# Patient Record
Sex: Male | Born: 1945 | Race: White | Hispanic: No | Marital: Married | Smoking: Never smoker
Health system: Southern US, Community
[De-identification: ages and names within clinical notes are randomized; demographics above are authoritative.]

## PROBLEM LIST (undated history)

## (undated) DIAGNOSIS — G4730 Sleep apnea, unspecified: Secondary | ICD-10-CM

## (undated) DIAGNOSIS — M1990 Unspecified osteoarthritis, unspecified site: Secondary | ICD-10-CM

## (undated) DIAGNOSIS — R140 Abdominal distension (gaseous): Secondary | ICD-10-CM

## (undated) DIAGNOSIS — N189 Chronic kidney disease, unspecified: Secondary | ICD-10-CM

## (undated) DIAGNOSIS — K219 Gastro-esophageal reflux disease without esophagitis: Secondary | ICD-10-CM

## (undated) DIAGNOSIS — C801 Malignant (primary) neoplasm, unspecified: Secondary | ICD-10-CM

## (undated) DIAGNOSIS — R21 Rash and other nonspecific skin eruption: Secondary | ICD-10-CM

## (undated) DIAGNOSIS — M5431 Sciatica, right side: Secondary | ICD-10-CM

## (undated) DIAGNOSIS — J189 Pneumonia, unspecified organism: Secondary | ICD-10-CM

## (undated) DIAGNOSIS — I10 Essential (primary) hypertension: Secondary | ICD-10-CM

## (undated) DIAGNOSIS — D649 Anemia, unspecified: Secondary | ICD-10-CM

## (undated) DIAGNOSIS — M5126 Other intervertebral disc displacement, lumbar region: Secondary | ICD-10-CM

## (undated) HISTORY — DX: Gastro-esophageal reflux disease without esophagitis: K21.9

## (undated) HISTORY — DX: Essential (primary) hypertension: I10

## (undated) HISTORY — PX: TONSILLECTOMY: SUR1361

## (undated) HISTORY — DX: Abdominal distension (gaseous): R14.0

## (undated) HISTORY — DX: Malignant (primary) neoplasm, unspecified: C80.1

## (undated) HISTORY — PX: PROSTATECTOMY: SHX69

## (undated) HISTORY — DX: Rash and other nonspecific skin eruption: R21

## (undated) HISTORY — DX: Other intervertebral disc displacement, lumbar region: M51.26

---

## 1999-08-20 ENCOUNTER — Emergency Department (HOSPITAL_COMMUNITY): Admission: EM | Admit: 1999-08-20 | Discharge: 1999-08-20 | Payer: Self-pay | Admitting: Emergency Medicine

## 2001-02-20 ENCOUNTER — Encounter (INDEPENDENT_AMBULATORY_CARE_PROVIDER_SITE_OTHER): Payer: Self-pay | Admitting: Specialist

## 2001-02-20 ENCOUNTER — Ambulatory Visit (HOSPITAL_COMMUNITY): Admission: RE | Admit: 2001-02-20 | Discharge: 2001-02-20 | Payer: Self-pay | Admitting: Gastroenterology

## 2009-03-03 ENCOUNTER — Encounter (INDEPENDENT_AMBULATORY_CARE_PROVIDER_SITE_OTHER): Payer: Self-pay | Admitting: Gastroenterology

## 2009-03-03 ENCOUNTER — Ambulatory Visit (HOSPITAL_COMMUNITY): Admission: RE | Admit: 2009-03-03 | Discharge: 2009-03-03 | Payer: Self-pay | Admitting: Gastroenterology

## 2009-09-14 ENCOUNTER — Ambulatory Visit: Admission: RE | Admit: 2009-09-14 | Discharge: 2009-10-01 | Payer: Self-pay | Admitting: Radiation Oncology

## 2009-10-20 ENCOUNTER — Inpatient Hospital Stay (HOSPITAL_COMMUNITY): Admission: RE | Admit: 2009-10-20 | Discharge: 2009-10-21 | Payer: Self-pay | Admitting: Urology

## 2009-10-20 ENCOUNTER — Encounter (INDEPENDENT_AMBULATORY_CARE_PROVIDER_SITE_OTHER): Payer: Self-pay | Admitting: Urology

## 2010-05-06 ENCOUNTER — Encounter: Admission: RE | Admit: 2010-05-06 | Discharge: 2010-05-06 | Payer: Self-pay | Admitting: Family Medicine

## 2010-08-31 ENCOUNTER — Ambulatory Visit (HOSPITAL_COMMUNITY)
Admission: RE | Admit: 2010-08-31 | Discharge: 2010-09-01 | Payer: Self-pay | Source: Home / Self Care | Attending: Surgery | Admitting: Surgery

## 2010-09-01 HISTORY — PX: HERNIA REPAIR: SHX51

## 2010-11-21 LAB — COMPREHENSIVE METABOLIC PANEL
ALT: 14 U/L (ref 0–53)
AST: 16 U/L (ref 0–37)
Albumin: 3.7 g/dL (ref 3.5–5.2)
BUN: 20 mg/dL (ref 6–23)
CO2: 27 mEq/L (ref 19–32)
Calcium: 9 mg/dL (ref 8.4–10.5)
Creatinine, Ser: 1.01 mg/dL (ref 0.4–1.5)
Potassium: 4.3 mEq/L (ref 3.5–5.1)
Sodium: 142 mEq/L (ref 135–145)
Total Bilirubin: 0.6 mg/dL (ref 0.3–1.2)
Total Protein: 6.6 g/dL (ref 6.0–8.3)

## 2010-11-21 LAB — CBC
Hemoglobin: 11.4 g/dL — ABNORMAL LOW (ref 13.0–17.0)
Platelets: 435 10*3/uL — ABNORMAL HIGH (ref 150–400)
RBC: 4.06 MIL/uL — ABNORMAL LOW (ref 4.22–5.81)
WBC: 6.3 10*3/uL (ref 4.0–10.5)

## 2010-11-21 LAB — SURGICAL PCR SCREEN: MRSA, PCR: NEGATIVE

## 2010-12-01 LAB — DIFFERENTIAL
Basophils Absolute: 0.1 10*3/uL (ref 0.0–0.1)
Basophils Relative: 1 % (ref 0–1)
Eosinophils Absolute: 0 10*3/uL (ref 0.0–0.7)
Eosinophils Relative: 0 % (ref 0–5)
Eosinophils Relative: 0 % (ref 0–5)
Lymphocytes Relative: 4 % — ABNORMAL LOW (ref 12–46)
Lymphocytes Relative: 5 % — ABNORMAL LOW (ref 12–46)
Lymphs Abs: 0.7 10*3/uL (ref 0.7–4.0)
Monocytes Absolute: 0.1 10*3/uL (ref 0.1–1.0)
Monocytes Relative: 0 % — ABNORMAL LOW (ref 3–12)
Neutrophils Relative %: 90 % — ABNORMAL HIGH (ref 43–77)

## 2010-12-01 LAB — CBC
HCT: 36.7 % — ABNORMAL LOW (ref 39.0–52.0)
HCT: 40.3 % (ref 39.0–52.0)
Hemoglobin: 12.5 g/dL — ABNORMAL LOW (ref 13.0–17.0)
Hemoglobin: 13.2 g/dL (ref 13.0–17.0)
MCHC: 33.9 g/dL (ref 30.0–36.0)
MCV: 89.2 fL (ref 78.0–100.0)
MCV: 89.7 fL (ref 78.0–100.0)
Platelets: 329 10*3/uL (ref 150–400)
RBC: 4.37 MIL/uL (ref 4.22–5.81)
RDW: 13.7 % (ref 11.5–15.5)
RDW: 14.2 % (ref 11.5–15.5)

## 2010-12-01 LAB — TYPE AND SCREEN
ABO/RH(D): A POS
Antibody Screen: NEGATIVE

## 2010-12-01 LAB — BASIC METABOLIC PANEL
BUN: 9 mg/dL (ref 6–23)
CO2: 28 mEq/L (ref 19–32)
Calcium: 8.3 mg/dL — ABNORMAL LOW (ref 8.4–10.5)
Calcium: 8.4 mg/dL (ref 8.4–10.5)
Chloride: 105 mEq/L (ref 96–112)
Chloride: 107 mEq/L (ref 96–112)
Creatinine, Ser: 1.08 mg/dL (ref 0.4–1.5)
Creatinine, Ser: 1.09 mg/dL (ref 0.4–1.5)
GFR calc non Af Amer: >60 mL/min (ref >60)
Glucose, Bld: 160 mg/dL — ABNORMAL HIGH (ref 70–99)
Sodium: 140 mEq/L (ref 135–145)

## 2010-12-01 LAB — COMPREHENSIVE METABOLIC PANEL
AST: 15 U/L (ref 0–37)
Alkaline Phosphatase: 61 U/L (ref 39–117)
BUN: 22 mg/dL (ref 6–23)
CO2: 27 mEq/L (ref 19–32)
Chloride: 108 mEq/L (ref 96–112)
GFR calc non Af Amer: >60 mL/min (ref >60)
Glucose, Bld: 104 mg/dL — ABNORMAL HIGH (ref 70–99)
Potassium: 4.1 mEq/L (ref 3.5–5.1)
Total Bilirubin: 0.8 mg/dL (ref 0.3–1.2)
Total Protein: 6.6 g/dL (ref 6.0–8.3)

## 2010-12-01 LAB — URINALYSIS, ROUTINE W REFLEX MICROSCOPIC
Bilirubin Urine: NEGATIVE
Glucose, UA: NEGATIVE mg/dL
Hgb urine dipstick: NEGATIVE
Nitrite: NEGATIVE

## 2010-12-01 LAB — PROTIME-INR: Prothrombin Time: 13.2 seconds (ref 11.6–15.2)

## 2011-01-24 NOTE — Op Note (Signed)
Troy [name], Troy [name]               ACCOUNT NO.:  [id number]   MEDICAL RECORD NO.:  [id number]          PATIENT TYPE:  AMB   LOCATION:  ENDO                         FACILITY:  [facility]   PHYSICIAN:  Bernette Redbird, M.D.   DATE OF BIRTH:  [birth year]   DATE OF PROCEDURE:  03/03/2009  DATE OF DISCHARGE:  03/03/2009                               OPERATIVE REPORT   PROCEDURE:  Upper endoscopy with Savary dilatation of the esophagus  under fluoroscopy, with biopsies.   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   FINDINGS:  Prominent, somewhat inflamed esophageal ring, about 5-cm  hiatal hernia, dilated to 18 mm.   PROCEDURE IN DETAIL:  The procedure was familiar to the patient and  provided written consent.  Sedation was fentanyl 100 mcg and Versed 12  mg IV without arrhythmias or desaturation.  The Pentax video endoscope  was passed under direct vision.  The vocal cords were not well seen, but  the esophagus was readily entered.  It was normal except for a somewhat  inflamed, minimally eroded ring at the squamocolumnar junction, at about  36 cm, below which was a 5-cm hiatal hernia with the diaphragmatic  hiatus at 41 cm.  The diaphragmatic hiatus was quite patulous,  especially as observed on retroflexed viewing.   The ring offered no resistance to passage of the endoscope and in fact  was fairly widely patent.   The stomach had 2 small sessile polyps on the greater curve, right next  to each other, each measuring a few millimeters across, and each cold  biopsied to what appeared to be complete excision.  Nearby, was a small  mucosal bump measuring perhaps a centimeter across and a few millimeters  in height, but completely normal overlying mucosa.  So, I did not  biopsy.  The remainder of the stomach was normal.  As noted,  retroflexion of the proximal stomach showed the  hiatal hernia from its  inferior perspective with a rather patulous diaphragmatic hiatus.  The  pylorus, duodenal bulb, and second duodenum looked normal.   Savary dilatation was then performed in the standard fashion.  The  spring-tip guidewire was passed into the antrum of the stomach through  the scope, and the scope was removed in an exchange fashion, leaving the  guidewire in place.  After fluoroscopic confirmation of appropriate  positioning of the wire, a single passage was made with an 18-mm Savary  dilator, encountering no discrete resistance.  The widest portion of the  dilator was confirmed fluoroscopically to pass below the level of the  diaphragm.  The patient was then re-endoscoped under direct vision,  which showed fresh hemorrhage at the site of the ring without evidence  of active bleeding or brisk bleeding, nor any undue trauma to the  stomach, esophagus, or hypopharynx.  I did not see a discrete fracture  of the ring as such, and there was no evidence of perforation.  The  scope was then removed from the  patient.  He tolerated the procedure  well and there were no apparent complications.   IMPRESSION:  1. Somewhat excoriated esophageal ring dilated to 18 mm as described      above.  2. Moderate-sized hiatal hernia.  3. Small gastric polyps, biopsied.   PLAN:  1. I did recommend that the patient start daily PPI therapy since he      does have occasional heartburn symptoms and since there was some      minimal esophagitis right at the level of the ring.  I recommended      over-the-counter omeprazole 20 mg daily for at least the next year.  2. Await pathology results on the gastric polyps.  3. Clinical followup of dysphagia symptoms with repeat dilatation on a      p.r.n. basis.           ______________________________  Bernette Redbird, M.D.     RB/MEDQ  D:  03/03/2009  T:  03/04/2009  Job:  [id number]

## 2011-01-25 ENCOUNTER — Other Ambulatory Visit: Payer: Self-pay | Admitting: Urology

## 2011-01-25 ENCOUNTER — Encounter (HOSPITAL_COMMUNITY): Payer: BC Managed Care – PPO

## 2011-01-25 LAB — BASIC METABOLIC PANEL
CO2: 27 mEq/L (ref 19–32)
Calcium: 8.5 mg/dL (ref 8.4–10.5)
Creatinine, Ser: 1.04 mg/dL (ref 0.4–1.5)
GFR calc non Af Amer: >60 mL/min (ref >60)
Glucose, Bld: 98 mg/dL (ref 70–99)
Potassium: 4.2 mEq/L (ref 3.5–5.1)

## 2011-01-25 LAB — PROTIME-INR
INR: 1.07 (ref 0.00–1.49)
Prothrombin Time: 14.1 seconds (ref 11.6–15.2)

## 2011-01-25 LAB — SURGICAL PCR SCREEN: MRSA, PCR: NEGATIVE

## 2011-01-25 LAB — CBC
MCHC: 31.5 g/dL (ref 30.0–36.0)
MCV: 83.3 fL (ref 78.0–100.0)
Platelets: 418 10*3/uL — ABNORMAL HIGH (ref 150–400)
WBC: 6.1 10*3/uL (ref 4.0–10.5)

## 2011-01-27 NOTE — Procedures (Signed)
[place]  Patient:    Troy [name], Troy [name]                      MRN: [id number] Proc. Date: 02/20/01 Adm. Date:  [id number] Attending:  Rich Brave CC:         Carolyne Fiscal, M.D.   Procedure Report  PROCEDURE:  Colonoscopy.  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  FINDINGS:  Diminutive sigmoid polyp.  Sigmoid and left-side diverticular disease.  DESCRIPTION OF PROCEDURE:  The nature, purpose, and risks of the procedure had been discussed with the patient, who provided written consent.  Sedation was fentanyl 75 mcg and Versed 7.5 mg IV for the endoscopy which preceded this colonoscopy, and no additional sedation had to be given.  Digital exam of the prostate was unremarkable.  The Olympus adult video colonoscope was readily advanced to the cecum and for a moderate distance into a normal-appearing terminal ileum.  There was some lymphoid hyperplasia with slight overlying erythema but no evidence of ulcerations or stenosis.  Pullback around the colon was performed.  The quality of the prep was excellent, so it is felt that all areas were well-seen.  There was a 2-3 mm sessile hyperplastic-appearing polyp in the sigmoid region at about 35 cm, removed by a single cold biopsy.  There was also quite a bit of sigmoid diverticulosis and a small amount of left-sided diverticulosis in the descending colon.  Retroflexion in the rectum was normal.  Pullout through the anal canal demonstrated moderate internal hemorrhoids.  The patient tolerated the procedure well, and there were no apparent complications.  IMPRESSION: 1. Diminutive sigmoid polyp, pathology pending. 2. Sigmoid diverticulosis. 3. Moderate internal hemorrhoids.  PLAN:  Await pathology on the polyp. DD:  02/20/01 TD:  02/20/01 Job: [id number]

## 2011-01-27 NOTE — Procedures (Signed)
[place]  Patient:    Troy [name], Troy [name]                      MRN: [id number] Proc. Date: 02/20/01 Adm. Date:  [id number] Attending:  Rich Brave CC:         Carolyne Fiscal, M.D.   Procedure Report  PROCEDURE:  Upper endoscopy.  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  FINDINGS:  Esophageal ring above moderately large hiatal hernia with patulous diaphragmatic hiatus.  DESCRIPTION OF PROCEDURE:  The nature, purpose, and risks of the procedure had been discussed with the patient, who provided written consent.  Sedation was fentanyl 75 mcg and Versed 7.5 mg IV without arrhythmias or desaturation.  The Olympus video endoscope was passed under direct vision.  The vocal cords were unremarkable in appearance.  The esophagus was readily entered and had normal mucosa.  Specifically, there was no evidence of reflux esophagitis or Barretts esophagus, nor any infection, varices, or neoplasia.  There was an esophageal ring at the squamocolumnar junction, and below this was a several-centimeter, moderately large hiatal hernia.  On entering the stomach, the diaphragmatic hiatus, on retroflex viewing, was noted to be somewhat patulous.  The gastric mucosa was free of gastritis, erosions, ulcers, polyps, or masses, and the pylorus, duodenal bulb, and second duodenum looked normal. The scope was removed from the patient, who tolerated the procedure well and without apparent complication.  No biopsies were obtained.  IMPRESSION: 1. No evidence of reflux esophagitis, Barretts esophagus, or other adverse    sequelae of gastroesophageal reflux disease. 2. Esophageal ring present.  The patient does admit to intermittent dysphagia    symptoms, which are presumably arising from this abnormality. 3. Moderately large hiatal hernia with patulous diaphragmatic hiatus.  PLAN: 1. For now, medical therapy.  The patient has been  using Nexium for the past    month but when he runs out of samples, he plans to switch to the ranitidine    prescription I provided for him to see if that will adequately control his    symptoms. 2. Given the relatively large hiatal hernia and the patulous character of his    diaphragmatic hiatus, the patient would probably be a good candidate for    consideration of surgical antireflux management (that is, an antireflux    operation), particularly if the medications do not control his symptoms to    his satisfaction. 3. The option of esophageal dilatation in the future was offered to the    patient in the even that his dysphagia symptoms become bothersome in the    future. DD:  02/20/01 TD:  02/20/01 Job: [id number]

## 2011-01-31 ENCOUNTER — Observation Stay (HOSPITAL_COMMUNITY)
Admission: RE | Admit: 2011-01-31 | Discharge: 2011-02-01 | Disposition: A | Discharge status: Home / Self Care | Payer: BC Managed Care – PPO | Source: Ambulatory Visit | Attending: Urology | Admitting: Urology

## 2011-01-31 DIAGNOSIS — Z79899 Other long term (current) drug therapy: Secondary | ICD-10-CM | POA: Insufficient documentation

## 2011-01-31 DIAGNOSIS — K219 Gastro-esophageal reflux disease without esophagitis: Secondary | ICD-10-CM | POA: Insufficient documentation

## 2011-01-31 DIAGNOSIS — N393 Stress incontinence (female) (male): Principal | ICD-10-CM | POA: Insufficient documentation

## 2011-01-31 DIAGNOSIS — I10 Essential (primary) hypertension: Secondary | ICD-10-CM | POA: Insufficient documentation

## 2011-01-31 HISTORY — PX: INCONTINENCE SURGERY: SHX676

## 2011-01-31 LAB — BASIC METABOLIC PANEL
CO2: 28 mEq/L (ref 19–32)
Chloride: 104 mEq/L (ref 96–112)
Creatinine, Ser: 0.87 mg/dL (ref 0.4–1.5)
GFR calc Af Amer: >60 mL/min (ref >60)

## 2011-02-01 LAB — BASIC METABOLIC PANEL
BUN: 14 mg/dL (ref 6–23)
Chloride: 104 mEq/L (ref 96–112)
GFR calc Af Amer: >60 mL/min (ref >60)
Potassium: 3.8 mEq/L (ref 3.5–5.1)

## 2011-02-01 LAB — HEMOGLOBIN AND HEMATOCRIT, BLOOD: HCT: 31.1 % — ABNORMAL LOW (ref 39.0–52.0)

## 2011-02-06 NOTE — Op Note (Signed)
Troy [name], Troy [name]               ACCOUNT NO.:  [id number]  MEDICAL RECORD NO.:  [id number]           PATIENT TYPE:  O  LOCATION:  1434                         FACILITY:  [facility]  PHYSICIAN:  Martina Sinner, MD DATE OF BIRTH:  1946-02-24  DATE OF PROCEDURE:  01/31/2011 DATE OF DISCHARGE:                              OPERATIVE REPORT   ASSISTANT:  Delia Chimes, NP  PREOPERATIVE DIAGNOSIS:  Stress urinary incontinence.  POSTOPERATIVE DIAGNOSIS:  Stress urinary incontinence.  SURGERY:  Male sling (advance) plus cystoscopy.  Troy [name] reportedly had 1-2 pads a day stress incontinence and sent to the above procedure.  Preoperative antibiotics were given.  Lab work was normal.  Extra care was taken with leg positioning to minimize the risk of compartment syndrome, neuropathy, DVT.  He had a quite a long perineum.  I made a 5-6 cm perineal incision after draping the rectal area with my usual technique.  I sharply  dissected down to the skin and subcutaneous tissue mobilized the bulbospongiosus muscle.  The muscle was easily split in the midline and I mobilized it at the bifurcation of the corporal bodies and to identify a thick perineal tendon.  There is no question of his inappropriate area and I placed a 2-0 Vicryl suture at this stage to mark it.  I then removed the 14-French catheter.  I cystoscoped the patient.  With the usual mobility technique, there was no rotational ascent with the winking of the sphincter at the appropriate level.  Following this, the catheter was re-placed.  I marked the upper edge of the obturator foramen below adductor tendon bilaterally.  Despite being a large person, I could feel the correct area away from the tendon.  I marked with a marking pen and then the foramen needle in the usual technique.  I made a small stab incision.  I visually placed my finger in the upper edge of the triangle lateral to the urethra and passed the advanced  trocar.  He had a double pop maneuver and with appropriate orientation brought the needle out safely at the upper edge of the triangle away from urethra and other soft tissue.  I attached the sling in the appropriate orientation and brought it off and rotated up through the obturator foramen with a small hemostat needle.  I did the exact same procedure on the right.  I sent to the sling down to the area of the perineal tendon after bringing the suture through its middle aspect and loosely tied.  Holding the sling in place with my finger with my usual LEED technique, I gently pulled on both straps and there was nice rotational ascent of the bulbar urethra.  I re-cystoscoped the patient.  There was nice winking of the membranous urethra.  Urethra was relatively straighter as appropriate.  There is no injury.  Irrigation of all incisions was utilized.  I did a 3-layer closure with 3-0 Vicryl anatomically followed by 4-0 subcuticular.  In the inguinal area, I did my usual technique, making 2 stab incisions, posterior to the initial incision and brought the sling through that area.  I irrigated  sheaths and removed the sheaths.  These 4 incisions were closed with interrupted 4-0 Vicryl and Dermabond.  Fluff dressing was applied.  Leg position was good in the case.  Blood loss was less than 30 mL.  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  He was behaving more like he had significant urethral insufficiency, but he reports only 1 or 2 pads a day.  Perhaps the anesthesia tip the balance causing the more severe incontinence.  I am not convinced he was actually having overflow incontinence at the beginning of the surgery while he asleep.          ______________________________ Martina Sinner, MD     SAM/MEDQ  D:  01/31/2011  T:   02/01/2011  Job:  [id number]  Electronically Signed by Alfredo Martinez MD on 02/06/2011 06:15:04 PM

## 2011-06-02 ENCOUNTER — Ambulatory Visit (INDEPENDENT_AMBULATORY_CARE_PROVIDER_SITE_OTHER): Payer: BC Managed Care – PPO | Admitting: Surgery

## 2011-06-02 ENCOUNTER — Encounter (INDEPENDENT_AMBULATORY_CARE_PROVIDER_SITE_OTHER): Payer: Self-pay | Admitting: Surgery

## 2011-06-02 VITALS — BP 128/84 | HR 72 | Temp 97.5°F | Resp 16 | Ht 69.5 in | Wt 220.4 lb

## 2011-06-02 DIAGNOSIS — K439 Ventral hernia without obstruction or gangrene: Secondary | ICD-10-CM

## 2011-06-02 NOTE — Progress Notes (Signed)
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  The Physiomesh that I installed 08/2010 has apparently pulled away and her has a recurrence.  I will get a CT scan o f the abdomen prior to repeat surgery.  I would place a scope in him and plan to do an anterior approach and close his fascia primarily.  He is concerned about the contour of his abdomen.  There may be some diastasis of the midline as well.   He wants to do this when he is off in November or December.   Will get a CT abdomen/pelvis and schedule for later this year.

## 2011-08-11 ENCOUNTER — Telehealth (INDEPENDENT_AMBULATORY_CARE_PROVIDER_SITE_OTHER): Payer: Self-pay | Admitting: General Surgery

## 2011-08-11 ENCOUNTER — Other Ambulatory Visit (INDEPENDENT_AMBULATORY_CARE_PROVIDER_SITE_OTHER): Payer: Self-pay | Admitting: General Surgery

## 2011-08-11 DIAGNOSIS — K439 Ventral hernia without obstruction or gangrene: Secondary | ICD-10-CM

## 2011-08-14 ENCOUNTER — Other Ambulatory Visit (INDEPENDENT_AMBULATORY_CARE_PROVIDER_SITE_OTHER): Payer: Self-pay | Admitting: General Surgery

## 2011-08-14 DIAGNOSIS — K469 Unspecified abdominal hernia without obstruction or gangrene: Secondary | ICD-10-CM

## 2011-08-16 ENCOUNTER — Ambulatory Visit
Admission: RE | Admit: 2011-08-16 | Discharge: 2011-08-16 | Disposition: A | Discharge status: Home / Self Care | Payer: Medicare Other | Source: Ambulatory Visit | Attending: Surgery | Admitting: Surgery

## 2011-08-16 DIAGNOSIS — K439 Ventral hernia without obstruction or gangrene: Secondary | ICD-10-CM

## 2011-08-23 ENCOUNTER — Encounter (INDEPENDENT_AMBULATORY_CARE_PROVIDER_SITE_OTHER): Payer: Self-pay | Admitting: Surgery

## 2011-09-01 ENCOUNTER — Encounter (HOSPITAL_COMMUNITY)
Admission: RE | Admit: 2011-09-01 | Discharge: 2011-09-01 | Disposition: A | Discharge status: Home / Self Care | Payer: Medicare Other | Source: Ambulatory Visit | Attending: Surgery | Admitting: Surgery

## 2011-09-01 ENCOUNTER — Encounter (HOSPITAL_COMMUNITY): Payer: Self-pay

## 2011-09-01 DIAGNOSIS — M5431 Sciatica, right side: Secondary | ICD-10-CM

## 2011-09-01 HISTORY — DX: Sciatica, right side: M54.31

## 2011-09-01 LAB — BASIC METABOLIC PANEL
CO2: 27 mEq/L (ref 19–32)
Chloride: 105 mEq/L (ref 96–112)
Glucose, Bld: 101 mg/dL — ABNORMAL HIGH (ref 70–99)
Potassium: 3.7 mEq/L (ref 3.5–5.1)
Sodium: 140 mEq/L (ref 135–145)

## 2011-09-01 LAB — SURGICAL PCR SCREEN
MRSA, PCR: NEGATIVE
Staphylococcus aureus: NEGATIVE

## 2011-09-01 LAB — CBC
Hemoglobin: 11.4 g/dL — ABNORMAL LOW (ref 13.0–17.0)
RBC: 4.23 MIL/uL (ref 4.22–5.81)

## 2011-09-01 NOTE — Patient Instructions (Signed)
20 Troy [name]  09/01/2011   Your procedure is scheduled on: 09-06-11  Report to [facility] at 0900 AM.  Call this number if you have problems the morning of surgery: [phone]   Remember:   Do not eat food:After Midnight.  May have clear liquids:until Midnight .  Clear liquids include soda, tea, black coffee, apple or grape juice, broth.  Take these medicines the morning of surgery with A SIP OF WATER: Amlodipine, Atenolol, Ranitidine, Tylenol   Do not wear jewelry, make-up or nail polish.  Do not wear lotions, powders, or perfumes. You may wear deodorant.  Do not shave 48 hours prior to surgery.  Do not bring valuables to the hospital.  Contacts, dentures or bridgework may not be worn into surgery.  Leave suitcase in the car. After surgery it may be brought to your room.  For patients admitted to the hospital, checkout time is 11:00 AM the day of discharge.   Patients discharged the day of surgery will not be allowed to drive home.  Name and phone number of your driver: Dedric Ethington, spouse [phone]  Special Instructions: CHG Shower Use Special Wash: 1/2 bottle night before surgery and 1/2 bottle morning of surgery.   Please read over the following fact sheets that you were given: MRSA Information

## 2011-09-01 NOTE — Pre-Procedure Instructions (Addendum)
09-01-11 CT ABDomen/pelvis(08-16-11) report with chart with chest view. Pt. States( "does not want to have further x-rays at this time"). No CXR done today due to this. EKG 01-25-11 report with chart.

## 2011-09-01 NOTE — Pre-Procedure Instructions (Signed)
09-01-11 EKG( 01-25-11) report with chart.

## 2011-09-06 ENCOUNTER — Ambulatory Visit (HOSPITAL_COMMUNITY): Payer: Medicare Other | Admitting: Anesthesiology

## 2011-09-06 ENCOUNTER — Encounter (HOSPITAL_COMMUNITY): Payer: Self-pay | Admitting: Anesthesiology

## 2011-09-06 ENCOUNTER — Observation Stay (HOSPITAL_COMMUNITY)
Admission: AD | Admit: 2011-09-06 | Discharge: 2011-09-07 | DRG: 355 | Disposition: A | Discharge status: Home / Self Care | Payer: Medicare Other | Source: Ambulatory Visit | Attending: Surgery | Admitting: Surgery

## 2011-09-06 ENCOUNTER — Encounter (HOSPITAL_COMMUNITY)
Admission: AD | Disposition: A | Discharge status: Home / Self Care | Payer: Self-pay | Source: Ambulatory Visit | Attending: Surgery

## 2011-09-06 ENCOUNTER — Encounter (HOSPITAL_COMMUNITY): Payer: Self-pay | Admitting: *Deleted

## 2011-09-06 DIAGNOSIS — K439 Ventral hernia without obstruction or gangrene: Secondary | ICD-10-CM

## 2011-09-06 DIAGNOSIS — Z8546 Personal history of malignant neoplasm of prostate: Secondary | ICD-10-CM

## 2011-09-06 DIAGNOSIS — K219 Gastro-esophageal reflux disease without esophagitis: Secondary | ICD-10-CM

## 2011-09-06 DIAGNOSIS — K432 Incisional hernia without obstruction or gangrene: Principal | ICD-10-CM | POA: Diagnosis present

## 2011-09-06 DIAGNOSIS — I10 Essential (primary) hypertension: Secondary | ICD-10-CM | POA: Diagnosis present

## 2011-09-06 DIAGNOSIS — Z01812 Encounter for preprocedural laboratory examination: Secondary | ICD-10-CM | POA: Insufficient documentation

## 2011-09-06 DIAGNOSIS — Z79899 Other long term (current) drug therapy: Secondary | ICD-10-CM | POA: Insufficient documentation

## 2011-09-06 DIAGNOSIS — Z9079 Acquired absence of other genital organ(s): Secondary | ICD-10-CM

## 2011-09-06 HISTORY — PX: VENTRAL HERNIA REPAIR: SHX424

## 2011-09-06 LAB — CREATININE, SERUM
Creatinine, Ser: 0.97 mg/dL (ref 0.50–1.35)
GFR calc Af Amer: >90 mL/min (ref >90)
GFR calc non Af Amer: 85 mL/min — ABNORMAL LOW (ref >90)

## 2011-09-06 LAB — CBC
Hemoglobin: 10.9 g/dL — ABNORMAL LOW (ref 13.0–17.0)
MCH: 27.4 pg (ref 26.0–34.0)
Platelets: 342 10*3/uL (ref 150–400)
RBC: 3.98 MIL/uL — ABNORMAL LOW (ref 4.22–5.81)
WBC: 11.4 10*3/uL — ABNORMAL HIGH (ref 4.0–10.5)

## 2011-09-06 SURGERY — REPAIR, HERNIA, VENTRAL, LAPAROSCOPIC
Anesthesia: General | Site: Abdomen | Wound class: Clean

## 2011-09-06 MED ORDER — DEXAMETHASONE SODIUM PHOSPHATE 10 MG/ML IJ SOLN
INTRAMUSCULAR | Status: DC | PRN
  Administered 2011-09-06: 10 mg via INTRAVENOUS

## 2011-09-06 MED ORDER — DIPHENHYDRAMINE HCL 50 MG/ML IJ SOLN: 12.5000 mg | Freq: Four times a day (QID) | INTRAMUSCULAR | Status: DC | PRN

## 2011-09-06 MED ORDER — NEOSTIGMINE METHYLSULFATE 1 MG/ML IJ SOLN
INTRAMUSCULAR | Status: DC | PRN
  Administered 2011-09-06: 5 mg via INTRAVENOUS

## 2011-09-06 MED ORDER — PANTOPRAZOLE SODIUM 40 MG IV SOLR
40.0000 mg | Freq: Every day | INTRAVENOUS | Status: DC
  Administered 2011-09-06: 40 mg via INTRAVENOUS
  Filled 2011-09-06 (×2): qty 40

## 2011-09-06 MED ORDER — BUPIVACAINE LIPOSOME 1.3 % IJ SUSP
20.0000 mL | Freq: Once | INTRAMUSCULAR | Status: DC
  Filled 2011-09-06: qty 20

## 2011-09-06 MED ORDER — ACETAMINOPHEN 10 MG/ML IV SOLN
INTRAVENOUS | Status: AC
  Filled 2011-09-06: qty 100

## 2011-09-06 MED ORDER — KCL IN DEXTROSE-NACL 20-5-0.45 MEQ/L-%-% IV SOLN
INTRAVENOUS | Status: DC
  Administered 2011-09-06 – 2011-09-07 (×2): via INTRAVENOUS
  Filled 2011-09-06 (×3): qty 1000

## 2011-09-06 MED ORDER — HEPARIN SODIUM (PORCINE) 5000 UNIT/ML IJ SOLN
5000.0000 [IU] | Freq: Three times a day (TID) | INTRAMUSCULAR | Status: DC
  Administered 2011-09-06 – 2011-09-07 (×3): 5000 [IU] via SUBCUTANEOUS
  Filled 2011-09-06 (×5): qty 1

## 2011-09-06 MED ORDER — FENTANYL CITRATE 0.05 MG/ML IJ SOLN
INTRAMUSCULAR | Status: DC | PRN
  Administered 2011-09-06: 50 ug via INTRAVENOUS
  Administered 2011-09-06: 100 ug via INTRAVENOUS
  Administered 2011-09-06 (×2): 50 ug via INTRAVENOUS

## 2011-09-06 MED ORDER — ONDANSETRON HCL 4 MG/2ML IJ SOLN
INTRAMUSCULAR | Status: DC | PRN
  Administered 2011-09-06: 4 mg via INTRAVENOUS

## 2011-09-06 MED ORDER — PROPOFOL 10 MG/ML IV EMUL
INTRAVENOUS | Status: DC | PRN
  Administered 2011-09-06: 200 mg via INTRAVENOUS

## 2011-09-06 MED ORDER — PROMETHAZINE HCL 25 MG/ML IJ SOLN: 6.2500 mg | INTRAMUSCULAR | Status: DC | PRN

## 2011-09-06 MED ORDER — ROCURONIUM BROMIDE 100 MG/10ML IV SOLN
INTRAVENOUS | Status: DC | PRN
  Administered 2011-09-06: 40 mg via INTRAVENOUS
  Administered 2011-09-06 (×2): 10 mg via INTRAVENOUS

## 2011-09-06 MED ORDER — ONDANSETRON HCL 4 MG/2ML IJ SOLN: 4.0000 mg | Freq: Four times a day (QID) | INTRAMUSCULAR | Status: DC | PRN

## 2011-09-06 MED ORDER — NALOXONE HCL 0.4 MG/ML IJ SOLN: 0.4000 mg | INTRAMUSCULAR | Status: DC | PRN

## 2011-09-06 MED ORDER — GLYCOPYRROLATE 0.2 MG/ML IJ SOLN
INTRAMUSCULAR | Status: DC | PRN
  Administered 2011-09-06: .2 mg via INTRAVENOUS
  Administered 2011-09-06: .6 mg via INTRAVENOUS

## 2011-09-06 MED ORDER — SODIUM CHLORIDE 0.9 % IJ SOLN: 9.0000 mL | INTRAMUSCULAR | Status: DC | PRN

## 2011-09-06 MED ORDER — ACETAMINOPHEN 10 MG/ML IV SOLN
INTRAVENOUS | Status: DC | PRN
  Administered 2011-09-06: 1000 mg via INTRAVENOUS

## 2011-09-06 MED ORDER — 0.9 % SODIUM CHLORIDE (POUR BTL) OPTIME
TOPICAL | Status: DC | PRN
  Administered 2011-09-06: 1000 mL

## 2011-09-06 MED ORDER — BUPIVACAINE LIPOSOME 1.3 % IJ SUSP
INTRAMUSCULAR | Status: DC | PRN
  Administered 2011-09-06: 20 mL

## 2011-09-06 MED ORDER — LACTATED RINGERS IV SOLN
INTRAVENOUS | Status: DC
Start: 2011-09-06 — End: 2011-09-06
  Administered 2011-09-06: 13:00:00 via INTRAVENOUS
  Administered 2011-09-06: 1000 mL via INTRAVENOUS
  Administered 2011-09-06: 12:00:00 via INTRAVENOUS

## 2011-09-06 MED ORDER — CEFAZOLIN SODIUM-DEXTROSE 2-3 GM-% IV SOLR
2.0000 g | Freq: Once | INTRAVENOUS | Status: AC
  Administered 2011-09-06: 2 g via INTRAVENOUS

## 2011-09-06 MED ORDER — LACTATED RINGERS IV SOLN: INTRAVENOUS | Status: DC

## 2011-09-06 MED ORDER — DIPHENHYDRAMINE HCL 12.5 MG/5ML PO ELIX: 12.5000 mg | ORAL_SOLUTION | Freq: Four times a day (QID) | ORAL | Status: DC | PRN

## 2011-09-06 MED ORDER — EPHEDRINE SULFATE 50 MG/ML IJ SOLN
INTRAMUSCULAR | Status: DC | PRN
  Administered 2011-09-06 (×2): 10 mg via INTRAVENOUS

## 2011-09-06 MED ORDER — CEFAZOLIN SODIUM-DEXTROSE 2-3 GM-% IV SOLR
INTRAVENOUS | Status: AC
  Filled 2011-09-06: qty 50

## 2011-09-06 MED ORDER — MORPHINE SULFATE (PF) 1 MG/ML IV SOLN
INTRAVENOUS | Status: DC
  Administered 2011-09-06: 3 mg via INTRAVENOUS
  Administered 2011-09-06: 16:00:00 via INTRAVENOUS
  Administered 2011-09-07 (×3): 1.5 mg via INTRAVENOUS
  Filled 2011-09-06: qty 25

## 2011-09-06 MED ORDER — HYDROMORPHONE HCL PF 1 MG/ML IJ SOLN
INTRAMUSCULAR | Status: AC
  Filled 2011-09-06: qty 1

## 2011-09-06 MED ORDER — HEPARIN SODIUM (PORCINE) 5000 UNIT/ML IJ SOLN
5000.0000 [IU] | Freq: Once | INTRAMUSCULAR | Status: AC
  Administered 2011-09-06: 5000 [IU] via SUBCUTANEOUS

## 2011-09-06 MED ORDER — LIDOCAINE HCL (CARDIAC) 20 MG/ML IV SOLN
INTRAVENOUS | Status: DC | PRN
  Administered 2011-09-06: 50 mg via INTRAVENOUS

## 2011-09-06 MED ORDER — DIPHENHYDRAMINE HCL 12.5 MG/5ML PO ELIX
12.5000 mg | ORAL_SOLUTION | Freq: Four times a day (QID) | ORAL | Status: DC | PRN
  Filled 2011-09-06: qty 5

## 2011-09-06 MED ORDER — MEPERIDINE HCL 50 MG/ML IJ SOLN: 6.2500 mg | INTRAMUSCULAR | Status: DC | PRN

## 2011-09-06 MED ORDER — SUCCINYLCHOLINE CHLORIDE 20 MG/ML IJ SOLN
INTRAMUSCULAR | Status: DC | PRN
  Administered 2011-09-06: 100 mg via INTRAVENOUS

## 2011-09-06 MED ORDER — HYDROMORPHONE HCL PF 1 MG/ML IJ SOLN
0.2500 mg | INTRAMUSCULAR | Status: DC | PRN
  Administered 2011-09-06 (×2): 0.5 mg via INTRAVENOUS

## 2011-09-06 SURGICAL SUPPLY — 66 items
APL SKNCLS STERI-STRIP NONHPOA (GAUZE/BANDAGES/DRESSINGS) ×2
BENZOIN TINCTURE PRP APPL 2/3 (GAUZE/BANDAGES/DRESSINGS) ×3 IMPLANT
BINDER ABD UNIV 12 45-62 (WOUND CARE) IMPLANT
BINDER ABD UNIV 9 30-45 (GAUZE/BANDAGES/DRESSINGS) IMPLANT
BINDER ABDOMINAL 46IN 62IN (WOUND CARE)
BINDER ABDOMINAL 9 (GAUZE/BANDAGES/DRESSINGS) ×3
BLADE HEX COATED 2.75 (ELECTRODE) ×3 IMPLANT
CANISTER SUCTION 2500CC (MISCELLANEOUS) ×5 IMPLANT
CANNULA ENDOPATH XCEL 11M (ENDOMECHANICALS) IMPLANT
CLOTH BEACON ORANGE TIMEOUT ST (SAFETY) ×6 IMPLANT
COVER SURGICAL LIGHT HANDLE (MISCELLANEOUS) ×3 IMPLANT
DECANTER SPIKE VIAL GLASS SM (MISCELLANEOUS) ×2 IMPLANT
DEVICE SECURE STRAP 25 ABSORB (INSTRUMENTS) IMPLANT
DEVICE TROCAR PUNCTURE CLOSURE (ENDOMECHANICALS) ×2 IMPLANT
DISSECTOR BLUNT TIP ENDO 5MM (MISCELLANEOUS) IMPLANT
DRAIN CHANNEL 10F 3/8 F FF (DRAIN) IMPLANT
DRAIN CHANNEL RND F F (WOUND CARE) IMPLANT
DRAPE LAPAROSCOPIC ABDOMINAL (DRAPES) ×6 IMPLANT
ELECT REM PT RETURN 9FT ADLT (ELECTROSURGICAL) ×6
ELECTRODE REM PT RTRN 9FT ADLT (ELECTROSURGICAL) ×4 IMPLANT
EVACUATOR SILICONE 100CC (DRAIN) IMPLANT
GLOVE BIOGEL M 8.0 STRL (GLOVE) ×6 IMPLANT
GLOVE BIOGEL PI IND STRL 7.0 (GLOVE) ×4 IMPLANT
GLOVE BIOGEL PI INDICATOR 7.0 (GLOVE) ×2
GOWN STRL NON-REIN LRG LVL3 (GOWN DISPOSABLE) ×6 IMPLANT
GOWN STRL REIN XL XLG (GOWN DISPOSABLE) ×12 IMPLANT
HAND ACTIVATED (MISCELLANEOUS) ×1 IMPLANT
KIT BASIN OR (CUSTOM PROCEDURE TRAY) ×6 IMPLANT
MESH HERNIA 3X6 (Mesh General) ×1 IMPLANT
NDL SPNL 22GX3.5 QUINCKE BK (NEEDLE) ×2 IMPLANT
NEEDLE HYPO 22GX1.5 SAFETY (NEEDLE) IMPLANT
NEEDLE SPNL 22GX3.5 QUINCKE BK (NEEDLE) ×3 IMPLANT
NS IRRIG 1000ML POUR BTL (IV SOLUTION) ×6 IMPLANT
PACK GENERAL/GYN (CUSTOM PROCEDURE TRAY) ×3 IMPLANT
PEN SKIN MARKING BROAD (MISCELLANEOUS) ×3 IMPLANT
PENCIL BUTTON HOLSTER BLD 10FT (ELECTRODE) ×1 IMPLANT
SET IRRIG TUBING LAPAROSCOPIC (IRRIGATION / IRRIGATOR) IMPLANT
SLEEVE XCEL OPT CAN 5 100 (ENDOMECHANICALS) IMPLANT
SLEEVE Z-THREAD 5X100MM (TROCAR) ×3 IMPLANT
SOLUTION ANTI FOG 6CC (MISCELLANEOUS) ×3 IMPLANT
SPONGE GAUZE 4X4 12PLY (GAUZE/BANDAGES/DRESSINGS) ×3 IMPLANT
SPONGE LAP 18X18 X RAY DECT (DISPOSABLE) ×6 IMPLANT
STAPLER VISISTAT 35W (STAPLE) ×3 IMPLANT
STRIP CLOSURE SKIN 1/2X4 (GAUZE/BANDAGES/DRESSINGS) ×6 IMPLANT
SUT ETHILON 3 0 PS 1 (SUTURE) IMPLANT
SUT NOVA 0 T19/GS 22DT (SUTURE) IMPLANT
SUT NOVA 1 T20/GS 25DT (SUTURE) IMPLANT
SUT NOVA NAB DX-16 0-1 5-0 T12 (SUTURE) ×2 IMPLANT
SUT NOVA NAB GS-21 1 T12 (SUTURE) ×1 IMPLANT
SUT PDS AB 1 CTX 36 (SUTURE) IMPLANT
SUT PROLENE 0 CT 1 CR/8 (SUTURE) ×3 IMPLANT
SUT VIC AB 4-0 SH 18 (SUTURE) ×4 IMPLANT
SYR 30ML LL (SYRINGE) ×3 IMPLANT
SYR CONTROL 10ML LL (SYRINGE) IMPLANT
TACKER 5MM HERNIA 3.5CML NAB (ENDOMECHANICALS) ×1 IMPLANT
TAPE CLOTH SURG 4X10 WHT LF (GAUZE/BANDAGES/DRESSINGS) ×1 IMPLANT
TIPS TEFLON (MISCELLANEOUS) ×2 IMPLANT
TRAY FOLEY CATH 14FRSI W/METER (CATHETERS) ×4 IMPLANT
TRAY LAP CHOLE (CUSTOM PROCEDURE TRAY) ×3 IMPLANT
TROCAR BLADELESS OPT 5 100 (ENDOMECHANICALS) ×3 IMPLANT
TROCAR HASSON GELL 12X100 (TROCAR) IMPLANT
TROCAR Z-THREAD FIOS 11X100 BL (TROCAR) ×3 IMPLANT
TROCAR Z-THREAD FIOS 5X100MM (TROCAR) ×3 IMPLANT
TROCAR Z-THREAD SLEEVE 11X100 (TROCAR) IMPLANT
TUBING FILTER THERMOFLATOR (ELECTROSURGICAL) IMPLANT
TUBING INSUFFLATION 10FT LAP (TUBING) ×3 IMPLANT

## 2011-09-06 NOTE — Anesthesia Preprocedure Evaluation (Addendum)
Anesthesia Evaluation  Patient identified by MRN, date of birth, ID band Patient awake    Reviewed: Allergy & Precautions, H&P , NPO status , Patient's Chart, lab work & pertinent test results  Airway Mallampati: II TM Distance: >3 FB Neck ROM: Full    Dental No notable dental hx.    Pulmonary neg pulmonary ROS,  clear to auscultation  Pulmonary exam normal       Cardiovascular hypertension, Pt. on medications neg cardio ROS Regular Normal    Neuro/Psych Negative Neurological ROS  Negative Psych ROS   GI/Hepatic negative GI ROS, Neg liver ROS, GERD-  ,  Endo/Other  Negative Endocrine ROS  Renal/GU negative Renal ROS  Genitourinary negative   Musculoskeletal negative musculoskeletal ROS (+)   Abdominal   Peds negative pediatric ROS (+)  Hematology negative hematology ROS (+)   Anesthesia Other Findings   Reproductive/Obstetrics negative OB ROS                           Anesthesia Physical Anesthesia Plan  ASA: II  Anesthesia Plan: General   Post-op Pain Management:    Induction: Intravenous  Airway Management Planned: Oral ETT  Additional Equipment:   Intra-op Plan:   Post-operative Plan: Extubation in OR  Informed Consent: I have reviewed the patients History and Physical, chart, labs and discussed the procedure including the risks, benefits and alternatives for the proposed anesthesia with the patient or authorized representative who has indicated his/her understanding and acceptance.   Dental advisory given  Plan Discussed with: CRNA  Anesthesia Plan Comments:        Anesthesia Quick Evaluation

## 2011-09-06 NOTE — Anesthesia Postprocedure Evaluation (Signed)
  Anesthesia Post-op Note  Patient: Troy [name]  Procedure(s) Performed:  HERNIA REPAIR VENTRAL ADULT; LAPAROSCOPIC VENTRAL HERNIA  Patient Location: PACU  Anesthesia Type: General  Level of Consciousness: awake and alert   Airway and Oxygen Therapy: Patient Spontanous Breathing  Post-op Pain: mild  Post-op Assessment: Post-op Vital signs reviewed, Patient's Cardiovascular Status Stable, Respiratory Function Stable, Patent Airway and No signs of Nausea or vomiting  Post-op Vital Signs: stable  Complications: No apparent anesthesia complications

## 2011-09-06 NOTE — Op Note (Signed)
Surgeon: Wenda Low, MD, FACS  Asst:  Lodema Pilot, DO   Anes:  general  Procedure: Lap assisted recurrent ventral hernia repair with mesh  Diagnosis: Recurrent ventral incisional hernia  Complications: none  EBL:   30 cc cc  Description of Procedure:   The patient was taken to or 1 and given general anesthesia. Abdomen was prepped with PCMX and draped sterilely. A timeout was performed. Access to the abdomen was achieved by incising an old incision in the left upper quadrant and inserting the 5 mm Optiview trocar with a 0 scope. The ventral hernia recurrent site was above the umbilicus and the omentum was densely adherent to the old mesh (Physiomesh) this was taken down with sharp and blunt dissection. The upper portion of the mesh had pulled away and layup inside this recurrent hernia sac. At this point elected to make an elliptical incision and excised the previous incision and then resect the hernia sac. The hernia sac contained a free edge of the mesh separating these 2. I will mobilize the omentum and pulled up in the incision and found one bleeder that I ligated with 4-0 Vicryl. We inspected the rest of the omentum and no other bleeders were noted. The mesh was then tacked superiorly into the right side with horizontal mattress sutures of 0 Novafil. This placed mesh entirely behind the hernia. I then made relaxing incisions on the right and left side and closed the defect primarily with #1 Novafil interrupted and didn't before tying them I threaded the sutures up through Marlex mesh and then tied this down. A good approximation closure was present. I went in with the scope again in and visualized the hernia repair in and looked good. The inguinal hernias that had been noted on CT scan were not visualized because of the amount of properitoneal fat that was noted down near. Was unable to visualize his sliding hiatal hernia from the trocar sites we had to utilize for the ventral  hernia.  Exparel was injected in the 2 laparoscopic ports in the hernia repair. The hernia repair site was further approximated with 4-0 Vicryl's to obliterate the deadspace of the wound and then 4-0 Vicryl subcutaneously and with all skin was closed with staples. Patient tolerated procedure well was taken to the recovery room in satisfactory condition.  Matt B. Daphine Deutscher, MD, [facility], [place] [phone]

## 2011-09-06 NOTE — H&P (Signed)
Chief Complaint:  Recurrent ventral incisional hernia.   History of Present Illness:  Troy [name] is an 65 y.o. male who had a robotic prostatectomy about 2 years ago.  I performed a lap ventral hernia repair on this supraumbilical incisional hernia last December.  He had a bladder sling in May and by September he noted a recurrent ventral hernia.  A CT scan showed omentum in the hernia.  Informed consent was obtained preop.  His primary care is Troy [name]   Past Medical History  Diagnosis Date  . GERD (gastroesophageal reflux disease)   . Cancer     prostate   . Hypertension   . Abdominal distention     hernia  . Rash     yeast infection from bladder sling surgery   . Sciatica of right side 09-01-11    right leg-tx. Tylenol    Past Surgical History  Procedure Date  . Hernia repair September 01, 2010  . Incontinence surgery Jan 31, 2011  . Prostatectomy reb 9, 2010    Current Facility-Administered Medications  Medication Dose Route Frequency Provider Last Rate Last Dose  . bupivacaine liposome (EXPAREL) 1.3 % injection 266 mg  20 mL Infiltration Once Valarie Merino, MD      . ceFAZolin (ANCEF) IVPB 2 g/50 mL premix  2 g Intravenous Once Valarie Merino, MD      . heparin injection 5,000 Units  5,000 Units Subcutaneous Once Valarie Merino, MD   5,000 Units at 09/06/11 0919  . lactated ringers infusion   Intravenous Continuous Phillips Grout, MD 100 mL/hr at 09/06/11 0941 1,000 mL at 09/06/11 0941   Ciprofloxacin Family History  Problem Relation Age of Onset  . Cancer Mother 90    lung    Social History:   reports that he has never smoked. He has never used smokeless tobacco. He reports that he drinks alcohol. He reports that he does not use illicit drugs.   REVIEW OF SYSTEMS - PERTINENT POSITIVES ONLY: GERD with probable anemia, no MI.  S/p prostatectomy; inguinal hernias  Physical Exam:   Blood pressure 155/89, pulse 58, temperature 98 F (36.7 C), temperature  source Oral, resp. rate 18, SpO2 98.00%. There is no height or weight on file to calculate BMI.  Gen:  WDWN W male in  NAD  Neurological: Alert and oriented to person, place, and time. Motor and sensory function is grossly intact  Head: Normocephalic and atraumatic.  Eyes: Conjunctivae are normal. Pupils are equal, round, and reactive to light. No scleral icterus.  Neck: Normal range of motion. Neck supple. No tracheal deviation or thyromegaly present.  Cardiovascular:  SR without murmurs or gallops.  No carotid bruits Respiratory: Effort normal.  No respiratory distress. No chest wall tenderness. Breath sounds normal.  No wheezes, rales or rhonchi.  Abdomen:  Ventral hernia at site of robotic prostatectomy  GU: Musculoskeletal: Normal range of motion. Extremities are nontender. No cyanosis, edema or clubbing noted Lymphadenopathy: No cervical, preauricular, postauricular or axillary adenopathy is present Skin: Skin is warm and dry. No rash noted. No diaphoresis. No erythema. No pallor. Pscyh: Normal mood and affect. Behavior is normal. Judgment and thought content normal.   LABORATORY RESULTS: No results found for this or any previous visit (from the past 48 hour(s)).  RADIOLOGY RESULTS: No results found.  Problem List: There is no problem list on file for this patient.   Assessment & Plan: Recurrent ventral hernia.  Plan laparoscopic/open hernia repair.  Matt B. Daphine Deutscher, MD, [facility] [phone] beeper [phone]  09/06/2011 11:03 AM  There has been no change in the patient's past medical history or physical exam in the past 24 hours to the best of my knowledge.  Expectations and outcome results have been discussed with the patient to include risks and benefits.  All questions have been answered and will proceed with previously discussed procedure noted and signed in the consent form in the patient's record.    Lisa Blakeman BMD @NOW   09/06/2011

## 2011-09-06 NOTE — Transfer of Care (Signed)
Immediate Anesthesia Transfer of Care Note  Patient: Troy [name]  Procedure(s) Performed:  HERNIA REPAIR VENTRAL ADULT; LAPAROSCOPIC VENTRAL HERNIA  Patient Location: PACU  Anesthesia Type: General  Level of Consciousness: sedated  Airway & Oxygen Therapy: Patient Spontanous Breathing and Patient connected to face mask oxygen  Post-op Assessment: Report given to PACU RN and Post -op Vital signs reviewed and stable  Post vital signs: Reviewed and stable  Complications: No apparent anesthesia complications

## 2011-09-06 NOTE — Progress Notes (Signed)
Utilization review completed.  

## 2011-09-07 DIAGNOSIS — K439 Ventral hernia without obstruction or gangrene: Secondary | ICD-10-CM

## 2011-09-07 DIAGNOSIS — Z8546 Personal history of malignant neoplasm of prostate: Secondary | ICD-10-CM

## 2011-09-07 DIAGNOSIS — K219 Gastro-esophageal reflux disease without esophagitis: Secondary | ICD-10-CM

## 2011-09-07 LAB — CBC
MCH: 27.3 pg (ref 26.0–34.0)
MCHC: 31.6 g/dL (ref 30.0–36.0)
Platelets: 346 10*3/uL (ref 150–400)

## 2011-09-07 MED ORDER — OXYCODONE-ACETAMINOPHEN 7.5-500 MG PO TABS: 1.0000 | ORAL_TABLET | ORAL | Status: AC | PRN

## 2011-09-07 NOTE — Progress Notes (Signed)
Patient provided with discharge teaching and prescription. Patient verbalized understanding. Patient discharged to home.  

## 2011-09-07 NOTE — Discharge Summary (Signed)
Physician Discharge Summary  Patient ID: Troy [name] MRN: [id number] DOB/AGE: Aug 03, 1946 65 y.o.  Admit date: 09/06/2011 Discharge date: 09/07/2011  Admission Diagnoses:  Discharge Diagnoses:  Active Problems:  Ventral hernia-s/p repair x 2  History of prostate cancer-s/p prostatectomy  GERD (gastroesophageal reflux disease)-with hiatus hernia   Discharged Condition: good  Hospital Course: Had surgery and was ready for discharge on POD 1.  Doing well.    Consults: none  Significant Diagnostic Studies: none  Treatments: surgery: lap assisted ventral hernia repair  Discharge Exam: Blood pressure 126/72, pulse 57, temperature 98.4 F (36.9 C), temperature source Oral, resp. rate 18, height 5\' 9"  (1.753 m), weight 222 lb 3.2 oz (100.789 kg), SpO2 98.00%. staples in place  Disposition: Home or Self Care  Discharge Orders    Future Appointments: Provider: Department: Dept Phone: Center:   09/28/2011 9:50 AM Valarie Merino, MD [facility] [phone] None     Future Orders Please Complete By Expires   Diet - low sodium heart healthy      Increase activity slowly      Discharge instructions      Comments:   Wear abdominal binder Stop by the office for staple removal in 1 week   Discharge wound care:      Comments:   Apply neosporin to staple line Staples to come out in 1 week.  Stop by office to have removed     Medication List  As of 09/07/2011  9:15 AM   START taking these medications         oxyCODONE-acetaminophen 7.5-500 MG per tablet   Commonly known as: PERCOCET   Take 1 tablet by mouth every 4 (four) hours as needed for pain.         CONTINUE taking these medications         acetaminophen 500 MG tablet   Commonly known as: TYLENOL      ALPRAZolam 0.5 MG tablet   Commonly known as: XANAX      amLODipine 10 MG tablet   Commonly known as: NORVASC      atenolol 50 MG tablet   Commonly known as: TENORMIN      ferrous fumarate 325 (106 FE)  MG Tabs   Commonly known as: HEMOCYTE - 106 mg FE      pravastatin 40 MG tablet   Commonly known as: PRAVACHOL      ranitidine 150 MG tablet   Commonly known as: ZANTAC          Where to get your medications    These are the prescriptions that you need to pick up.   You may get these medications from any pharmacy.         oxyCODONE-acetaminophen 7.5-500 MG per tablet           Follow-up Information    Follow up with Deryk Bozman B, MD in 1 week.   Contact information:   [employer] [address], [address] [place] [address] [phone]          Signed: Valarie Merino 09/07/2011, 9:15 AM

## 2011-09-11 ENCOUNTER — Encounter (HOSPITAL_COMMUNITY): Payer: Self-pay | Admitting: Surgery

## 2011-09-13 ENCOUNTER — Ambulatory Visit (INDEPENDENT_AMBULATORY_CARE_PROVIDER_SITE_OTHER): Payer: Medicare Other

## 2011-09-13 DIAGNOSIS — K439 Ventral hernia without obstruction or gangrene: Secondary | ICD-10-CM

## 2011-09-13 NOTE — Progress Notes (Unsigned)
Pt comes to office for staple removal. Staples removed from midline and lap sites. Area well approximated, 1/2 in steri strips applied midline. Pt is afebrile, no drainage post op appt made.

## 2011-09-28 ENCOUNTER — Encounter (INDEPENDENT_AMBULATORY_CARE_PROVIDER_SITE_OTHER): Payer: BC Managed Care – PPO | Admitting: Surgery

## 2011-09-29 ENCOUNTER — Encounter (INDEPENDENT_AMBULATORY_CARE_PROVIDER_SITE_OTHER): Payer: Self-pay | Admitting: Surgery

## 2011-09-29 ENCOUNTER — Ambulatory Visit (INDEPENDENT_AMBULATORY_CARE_PROVIDER_SITE_OTHER): Payer: Medicare Other | Admitting: Surgery

## 2011-09-29 VITALS — BP 128/76 | HR 68 | Temp 97.8°F | Resp 18 | Ht 69.0 in | Wt 211.6 lb

## 2011-09-29 DIAGNOSIS — K439 Ventral hernia without obstruction or gangrene: Secondary | ICD-10-CM

## 2011-09-29 NOTE — Progress Notes (Signed)
Troy [name] and his wife came in today for a postop visit. His incision looks good and except for some ecchymosis in the left flank really has no evidence of his surgery. I told him I would probably refrained from sit-ups for about 2 months before resuming this. He is also losing weight by staying on a low carb diet.

## 2011-10-02 ENCOUNTER — Other Ambulatory Visit: Payer: Self-pay | Admitting: Family Medicine

## 2011-10-02 DIAGNOSIS — M5416 Radiculopathy, lumbar region: Secondary | ICD-10-CM

## 2011-10-03 ENCOUNTER — Other Ambulatory Visit: Payer: Self-pay | Admitting: Family Medicine

## 2011-10-03 DIAGNOSIS — M5416 Radiculopathy, lumbar region: Secondary | ICD-10-CM

## 2011-10-04 ENCOUNTER — Ambulatory Visit
Admission: RE | Admit: 2011-10-04 | Discharge: 2011-10-04 | Disposition: A | Discharge status: Home / Self Care | Payer: Medicare Other | Source: Ambulatory Visit | Attending: Family Medicine | Admitting: Family Medicine

## 2011-10-04 DIAGNOSIS — M5416 Radiculopathy, lumbar region: Secondary | ICD-10-CM

## 2011-10-04 MED ORDER — IOHEXOL 180 MG/ML  SOLN
1.0000 mL | Freq: Once | INTRAMUSCULAR | Status: AC | PRN
  Administered 2011-10-04: 1 mL via EPIDURAL

## 2011-10-04 MED ORDER — METHYLPREDNISOLONE ACETATE 40 MG/ML INJ SUSP (RADIOLOG
120.0000 mg | Freq: Once | INTRAMUSCULAR | Status: AC
  Administered 2011-10-04: 120 mg via EPIDURAL

## 2011-10-11 ENCOUNTER — Other Ambulatory Visit: Payer: Self-pay | Admitting: Family Medicine

## 2011-10-11 DIAGNOSIS — M79604 Pain in right leg: Secondary | ICD-10-CM

## 2011-10-18 NOTE — Telephone Encounter (Signed)
error 

## 2011-10-20 ENCOUNTER — Ambulatory Visit
Admission: RE | Admit: 2011-10-20 | Discharge: 2011-10-20 | Disposition: A | Discharge status: Home / Self Care | Payer: Medicare Other | Source: Ambulatory Visit | Attending: Family Medicine | Admitting: Family Medicine

## 2011-10-20 ENCOUNTER — Other Ambulatory Visit: Payer: Medicare Other

## 2011-10-20 VITALS — BP 163/88 | HR 52

## 2011-10-20 DIAGNOSIS — M79604 Pain in right leg: Secondary | ICD-10-CM

## 2011-11-16 ENCOUNTER — Other Ambulatory Visit: Payer: Self-pay | Admitting: Family Medicine

## 2011-11-16 DIAGNOSIS — M5416 Radiculopathy, lumbar region: Secondary | ICD-10-CM

## 2012-01-05 ENCOUNTER — Other Ambulatory Visit: Payer: Self-pay | Admitting: Gastroenterology

## 2012-02-21 ENCOUNTER — Encounter (INDEPENDENT_AMBULATORY_CARE_PROVIDER_SITE_OTHER): Payer: Self-pay | Admitting: Surgery

## 2012-03-08 ENCOUNTER — Ambulatory Visit (INDEPENDENT_AMBULATORY_CARE_PROVIDER_SITE_OTHER): Payer: Medicare Other | Admitting: Surgery

## 2012-03-08 ENCOUNTER — Encounter (INDEPENDENT_AMBULATORY_CARE_PROVIDER_SITE_OTHER): Payer: Self-pay | Admitting: Surgery

## 2012-03-08 VITALS — BP 138/80 | HR 63 | Temp 98.1°F | Ht 69.5 in | Wt 218.2 lb

## 2012-03-08 DIAGNOSIS — K439 Ventral hernia without obstruction or gangrene: Secondary | ICD-10-CM

## 2012-03-08 NOTE — Progress Notes (Signed)
Troy [name] 66 y.o.  Body mass index is 31.76 kg/(m^2).  Patient Active Problem List  Diagnosis  . Ventral hernia-s/p repair x 2  . History of prostate cancer-s/p prostatectomy  . GERD (gastroesophageal reflux disease)-with hiatus hernia    Allergies  Allergen Reactions  . Ciprofloxacin Diarrhea and Nausea And Vomiting    Can take Levaquin    Past Surgical History  Procedure Date  . Hernia repair September 01, 2010  . Incontinence surgery Jan 31, 2011  . Prostatectomy reb 9, 2010  . Ventral hernia repair 09/06/2011    Procedure: HERNIA REPAIR VENTRAL ADULT;  Surgeon: Valarie Merino, MD;  Location: [facility];  Service: General;  Laterality: N/A;  . Ventral hernia repair 09/06/2011    Procedure: LAPAROSCOPIC VENTRAL HERNIA;  Surgeon: Valarie Merino, MD;  Location: [facility];  Service: General;  Laterality: N/A;   Carolyne Fiscal, MD No diagnosis found.  Troy [name] and his wife are headed to [facility] for two weeks.  He has been having some left inguinal pain intermittently and when he twists. On exam I cannot feel either a left or a right inguinal hernia. He does have a modest amount of fat in that region. His ventral hernia repair is intact. I've encouraged him to wear an abdominal binder and to be more active particularly to build his abdominal muscles back. I told him that I would be happy to see him back in 6 weeks to recheck his left inguinal region. Troy B. Daphine Deutscher, MD, [facility] [phone] beeper [phone]  03/08/2012 9:30 AM

## 2012-03-08 NOTE — Patient Instructions (Addendum)
Thanks for your patience.  If you need further assistance after leaving the office, please call our office and speak with Tracy A.  [phone].  If you want to leave a message for Dr. Amberlyn Martinezgarcia, please call his office phone at [phone]. 

## 2012-08-16 ENCOUNTER — Other Ambulatory Visit: Payer: Self-pay | Admitting: Family Medicine

## 2012-08-16 ENCOUNTER — Ambulatory Visit
Admission: RE | Admit: 2012-08-16 | Discharge: 2012-08-16 | Disposition: A | Discharge status: Home / Self Care | Payer: Medicare Other | Source: Ambulatory Visit | Attending: Family Medicine | Admitting: Family Medicine

## 2012-08-16 DIAGNOSIS — R911 Solitary pulmonary nodule: Secondary | ICD-10-CM

## 2012-08-16 DIAGNOSIS — R222 Localized swelling, mass and lump, trunk: Secondary | ICD-10-CM

## 2013-09-29 ENCOUNTER — Other Ambulatory Visit: Payer: Self-pay | Admitting: Family Medicine

## 2013-09-29 DIAGNOSIS — R911 Solitary pulmonary nodule: Secondary | ICD-10-CM

## 2013-10-03 ENCOUNTER — Ambulatory Visit
Admission: RE | Admit: 2013-10-03 | Discharge: 2013-10-03 | Disposition: A | Discharge status: Home / Self Care | Payer: Medicare Other | Source: Ambulatory Visit | Attending: Family Medicine | Admitting: Family Medicine

## 2013-10-03 DIAGNOSIS — R911 Solitary pulmonary nodule: Secondary | ICD-10-CM

## 2013-10-08 ENCOUNTER — Other Ambulatory Visit: Payer: Self-pay | Admitting: Family Medicine

## 2013-10-08 DIAGNOSIS — R1032 Left lower quadrant pain: Secondary | ICD-10-CM

## 2013-10-17 ENCOUNTER — Ambulatory Visit
Admission: RE | Admit: 2013-10-17 | Discharge: 2013-10-17 | Disposition: A | Discharge status: Home / Self Care | Payer: Medicare Other | Source: Ambulatory Visit | Attending: Family Medicine | Admitting: Family Medicine

## 2013-10-17 DIAGNOSIS — R1032 Left lower quadrant pain: Secondary | ICD-10-CM

## 2013-10-17 MED ORDER — IOHEXOL 300 MG/ML  SOLN
125.0000 mL | Freq: Once | INTRAMUSCULAR | Status: AC | PRN
  Administered 2013-10-17: 125 mL via INTRAVENOUS

## 2014-05-20 ENCOUNTER — Other Ambulatory Visit: Payer: Self-pay | Admitting: Gastroenterology

## 2014-07-03 ENCOUNTER — Other Ambulatory Visit: Payer: Self-pay | Admitting: Gastroenterology

## 2014-07-03 ENCOUNTER — Ambulatory Visit
Admission: RE | Admit: 2014-07-03 | Discharge: 2014-07-03 | Disposition: A | Discharge status: Home / Self Care | Payer: Medicare Other | Source: Ambulatory Visit | Attending: Gastroenterology | Admitting: Gastroenterology

## 2014-07-03 DIAGNOSIS — D509 Iron deficiency anemia, unspecified: Secondary | ICD-10-CM

## 2014-08-14 ENCOUNTER — Ambulatory Visit (INDEPENDENT_AMBULATORY_CARE_PROVIDER_SITE_OTHER): Payer: Self-pay | Admitting: Surgery

## 2014-08-14 ENCOUNTER — Other Ambulatory Visit (INDEPENDENT_AMBULATORY_CARE_PROVIDER_SITE_OTHER): Payer: Self-pay | Admitting: Surgery

## 2014-08-14 DIAGNOSIS — K449 Diaphragmatic hernia without obstruction or gangrene: Secondary | ICD-10-CM

## 2014-08-14 NOTE — H&P (Signed)
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  He was worked up by Dr. Ernest Haber thoroughly and it's felt that this is the cause of the anemia.  In addition he's had significant GERD and has required dilatation on a couple of occasions.  He came to the office and we discussed repair in detail and he wants to go ahead and proceed with that.  We'll get an upper GI series before laparoscopic Nissen fundoplication.  Past Medical History  Diagnosis Date  . GERD (gastroesophageal reflux disease)   . Cancer     prostate   . Hypertension   . Abdominal distention     hernia  . Rash     yeast infection from bladder sling surgery   . Sciatica of right side 09-01-11    right leg-tx. Tylenol  . Lumbar herniated disc     L5    Past Surgical History  Procedure Laterality Date  . Hernia repair  September 01, 2010  . Incontinence surgery  Jan 31, 2011  . Prostatectomy  reb 9, 2010  . Ventral hernia repair  09/06/2011    Procedure: HERNIA REPAIR VENTRAL ADULT;  Surgeon: Pedro Earls, MD;  Location: [facility];  Service: General;  Laterality: N/A;  . Ventral hernia repair  09/06/2011    Procedure: LAPAROSCOPIC VENTRAL HERNIA;  Surgeon: Pedro Earls, MD;  Location: [facility];  Service: General;  Laterality: N/A;    Current Outpatient Prescriptions  Medication Sig Dispense Refill  . acetaminophen (TYLENOL) 500 MG tablet Take 500 mg by mouth every 6 (six) hours as needed.      . ALPRAZolam (XANAX) 0.5 MG tablet Take 0.25-0.5 mg by mouth at bedtime as needed. For sleep.    Marland Kitchen amLODipine (NORVASC) 10 MG tablet Take 10 mg by mouth daily before breakfast.     . atenolol (TENORMIN) 50 MG tablet Take 50 mg by mouth daily before breakfast.     . ferrous fumarate (HEMOCYTE - 106 MG FE) 325 (106 FE) MG TABS Take 1 tablet  by mouth daily.     . Omeprazole Magnesium (PRILOSEC OTC PO) Take by mouth daily.    . pravastatin (PRAVACHOL) 40 MG tablet Take 40 mg by mouth every evening.      No current facility-administered medications for this visit.   Ciprofloxacin Family History  Problem Relation Age of Onset  . Cancer Mother 3    lung    Social History:   reports that he has never smoked. He has never used smokeless tobacco. He reports that he drinks alcohol. He reports that he does not use illicit drugs.   REVIEW OF SYSTEMS : Negative except for C problem list  Physical Exam:   There were no vitals taken for this visit. There is no weight on file to calculate BMI.  Gen:  WDWN white male NAD  Neurological: Alert and oriented to person, place, and time. Motor and sensory function is grossly intact  Head: Normocephalic and atraumatic.  Eyes: Conjunctivae are normal. Pupils are equal, round, and reactive to light. No scleral icterus.  Neck: Normal range of motion. Neck supple. No tracheal deviation or thyromegaly present.  Cardiovascular:  SR without murmurs or gallops.  No carotid bruits Breast:  Not examined Respiratory: Effort normal.  No  respiratory distress. No chest wall tenderness. Breath sounds normal.  No wheezes, rales or rhonchi.  Abdomen:  Incision from prior hernia repair by me.  He has also had a sling for urinary incontinence. GU:  Sling for urinary incontinence Musculoskeletal: Normal range of motion. Extremities are nontender. No cyanosis, edema or clubbing noted Lymphadenopathy: No cervical, preauricular, postauricular or axillary adenopathy is present Skin: Skin is warm and dry. No rash noted. No diaphoresis. No erythema. No pallor. Pscyh: Normal mood and affect. Behavior is normal. Judgment and thought content normal.   LABORATORY RESULTS: No results found for this or any previous visit (from the past 48 hour(s)).   RADIOLOGY RESULTS: No results found.  Problem List: Patient  Active Problem List   Diagnosis Date Noted  . Ventral hernia-s/p repair x 2 09/07/2011  . History of prostate cancer-s/p prostatectomy 09/07/2011  . GERD (gastroesophageal reflux disease)-with hiatus hernia 09/07/2011    Assessment & Plan: Symptomatic type III mixed hiatal hernia.  Plan laparoscopic repair.    Matt B. Hassell Done, MD, [facility] [phone] beeper [phone]  08/14/2014 5:20 PM

## 2014-08-20 ENCOUNTER — Other Ambulatory Visit: Payer: Medicare Other

## 2014-12-01 ENCOUNTER — Other Ambulatory Visit (INDEPENDENT_AMBULATORY_CARE_PROVIDER_SITE_OTHER): Payer: Self-pay

## 2014-12-04 ENCOUNTER — Encounter (INDEPENDENT_AMBULATORY_CARE_PROVIDER_SITE_OTHER): Payer: Self-pay

## 2014-12-04 ENCOUNTER — Other Ambulatory Visit (INDEPENDENT_AMBULATORY_CARE_PROVIDER_SITE_OTHER): Payer: Self-pay | Admitting: Surgery

## 2014-12-04 ENCOUNTER — Ambulatory Visit
Admission: RE | Admit: 2014-12-04 | Discharge: 2014-12-04 | Disposition: A | Discharge status: Home / Self Care | Payer: Self-pay | Source: Ambulatory Visit | Attending: Surgery | Admitting: Surgery

## 2014-12-04 DIAGNOSIS — K449 Diaphragmatic hernia without obstruction or gangrene: Secondary | ICD-10-CM

## 2015-02-26 NOTE — Progress Notes (Signed)
Please put orders in Epic surgery 03-10-15 pre op 03-05-15 Thanks

## 2015-03-03 NOTE — Patient Instructions (Addendum)
Troy [name]  03/03/2015   Your procedure is scheduled on:    03/10/2015    Report to [facility] Main  Entrance take [place]  elevators to 3rd floor to  [place] at    0700 AM.  Call this number if you have problems the morning of surgery [phone]   Remember: ONLY 1 PERSON MAY GO WITH YOU TO SHORT STAY TO GET  READY MORNING OF [name].  Do not eat food or drink liquids :After Midnight.     Take these medicines the morning of surgery with A SIP OF WATER:   Amlodipine ( Norvasc), Atenolol ( Tenormin), Prilosec                                You may not have any metal on your body including hair pins and              piercings  Do not wear jewelry, , lotions, powders or perfumes, deodorant                        Men may shave face and neck.   Do not bring valuables to the hospital. [name].  Contacts, dentures or bridgework may not be worn into surgery.  Leave suitcase in the car. After surgery it may be brought to your room.         Special Instructions: coughing and deep breathing exercises, leg exercises               Please read over the following fact sheets you were given: _____________________________________________________________________             [facility] - Preparing for Surgery Before surgery, you can play an important role.  Because skin is not sterile, your skin needs to be as free of germs as possible.  You can reduce the number of germs on your skin by washing with CHG (chlorahexidine gluconate) soap before surgery.  CHG is an antiseptic cleaner which kills germs and bonds with the skin to continue killing germs even after washing. Please DO NOT use if you have an allergy to CHG or antibacterial soaps.  If your skin becomes reddened/irritated stop using the CHG and inform your nurse when you arrive at Short Stay. Do not shave (including legs and underarms) for at least 48  hours prior to the first CHG shower.  You may shave your face/neck. Please follow these instructions carefully:  1.  Shower with CHG Soap the night before surgery and the  morning of Surgery.  2.  If you choose to wash your hair, wash your hair first as usual with your  normal  shampoo.  3.  After you shampoo, rinse your hair and body thoroughly to remove the  shampoo.                           4.  Use CHG as you would any other liquid soap.  You can apply chg directly  to the skin and wash                       Gently with a  scrungie or clean washcloth.  5.  Apply the CHG Soap to your body ONLY FROM THE NECK DOWN.   Do not use on face/ open                           Wound or open sores. Avoid contact with eyes, ears mouth and genitals (private parts).                       Wash face,  Genitals (private parts) with your normal soap.             6.  Wash thoroughly, paying special attention to the area where your surgery  will be performed.  7.  Thoroughly rinse your body with warm water from the neck down.  8.  DO NOT shower/wash with your normal soap after using and rinsing off  the CHG Soap.                9.  Pat yourself dry with a clean towel.            10.  Wear clean pajamas.            11.  Place clean sheets on your bed the night of your first shower and do not  sleep with pets. Day of Surgery : Do not apply any lotions/deodorants the morning of surgery.  Please wear clean clothes to the hospital/surgery center.  FAILURE TO FOLLOW THESE INSTRUCTIONS MAY RESULT IN THE CANCELLATION OF YOUR SURGERY PATIENT SIGNATURE_________________________________  NURSE SIGNATURE__________________________________  ________________________________________________________________________

## 2015-03-03 NOTE — Progress Notes (Signed)
Surgery on 6/29.  Preop on 6/24.  Need orders in EPIC.  Thank You.

## 2015-03-05 ENCOUNTER — Encounter (HOSPITAL_COMMUNITY): Payer: Self-pay

## 2015-03-05 ENCOUNTER — Encounter (HOSPITAL_COMMUNITY)
Admission: RE | Admit: 2015-03-05 | Discharge: 2015-03-05 | Disposition: A | Discharge status: Home / Self Care | Payer: Medicare Other | Source: Ambulatory Visit | Attending: Surgery | Admitting: Surgery

## 2015-03-05 DIAGNOSIS — K449 Diaphragmatic hernia without obstruction or gangrene: Secondary | ICD-10-CM | POA: Diagnosis not present

## 2015-03-05 DIAGNOSIS — Z01818 Encounter for other preprocedural examination: Secondary | ICD-10-CM | POA: Insufficient documentation

## 2015-03-05 HISTORY — DX: Unspecified osteoarthritis, unspecified site: M19.90

## 2015-03-05 HISTORY — DX: Anemia, unspecified: D64.9

## 2015-03-05 LAB — BASIC METABOLIC PANEL
Anion gap: 9 (ref 5–15)
BUN: 26 mg/dL — ABNORMAL HIGH (ref 6–20)
CALCIUM: 8.8 mg/dL — AB (ref 8.9–10.3)
CO2: 24 mmol/L (ref 22–32)
CREATININE: 1.01 mg/dL (ref 0.61–1.24)
Chloride: 108 mmol/L (ref 101–111)
GFR calc Af Amer: >60 mL/min (ref >60)
GFR calc non Af Amer: >60 mL/min (ref >60)
GLUCOSE: 104 mg/dL — AB (ref 65–99)
Potassium: 3.7 mmol/L (ref 3.5–5.1)
Sodium: 141 mmol/L (ref 135–145)

## 2015-03-05 NOTE — Progress Notes (Signed)
BMP done 03/05/2015 faxed via EPIC to Dr Hassell Done.

## 2015-03-05 NOTE — Progress Notes (Signed)
Received CBC results from Dr Sandi Mariscal office done 02/10/2015 and placed on chart.   EKG- 10/30/2014- placed on chart.

## 2015-03-05 NOTE — Progress Notes (Signed)
Called office of Dr Derinda Late.  They are to fax labs of CBC ( 02/10/2015) and EKG done 08/2014.

## 2015-03-10 ENCOUNTER — Ambulatory Visit (HOSPITAL_COMMUNITY): Payer: Medicare Other | Admitting: Certified Registered Nurse Anesthetist

## 2015-03-10 ENCOUNTER — Encounter (HOSPITAL_COMMUNITY): Payer: Self-pay | Admitting: *Deleted

## 2015-03-10 ENCOUNTER — Encounter (HOSPITAL_COMMUNITY)
Admission: RE | Disposition: A | Discharge status: Home / Self Care | Payer: Self-pay | Source: Ambulatory Visit | Attending: Surgery

## 2015-03-10 ENCOUNTER — Ambulatory Visit: Payer: Self-pay | Admitting: Surgery

## 2015-03-10 ENCOUNTER — Inpatient Hospital Stay (HOSPITAL_COMMUNITY)
Admission: RE | Admit: 2015-03-10 | Discharge: 2015-03-12 | DRG: 328 | Disposition: A | Discharge status: Home / Self Care | Payer: Medicare Other | Source: Ambulatory Visit | Attending: Surgery | Admitting: Surgery

## 2015-03-10 DIAGNOSIS — Z01812 Encounter for preprocedural laboratory examination: Secondary | ICD-10-CM

## 2015-03-10 DIAGNOSIS — K219 Gastro-esophageal reflux disease without esophagitis: Secondary | ICD-10-CM | POA: Diagnosis present

## 2015-03-10 DIAGNOSIS — K449 Diaphragmatic hernia without obstruction or gangrene: Secondary | ICD-10-CM | POA: Diagnosis present

## 2015-03-10 DIAGNOSIS — Z79899 Other long term (current) drug therapy: Secondary | ICD-10-CM | POA: Diagnosis not present

## 2015-03-10 DIAGNOSIS — D649 Anemia, unspecified: Secondary | ICD-10-CM | POA: Diagnosis present

## 2015-03-10 DIAGNOSIS — Z9079 Acquired absence of other genital organ(s): Secondary | ICD-10-CM | POA: Diagnosis present

## 2015-03-10 DIAGNOSIS — Z8546 Personal history of malignant neoplasm of prostate: Secondary | ICD-10-CM | POA: Diagnosis not present

## 2015-03-10 DIAGNOSIS — I10 Essential (primary) hypertension: Secondary | ICD-10-CM | POA: Diagnosis present

## 2015-03-10 DIAGNOSIS — Z801 Family history of malignant neoplasm of trachea, bronchus and lung: Secondary | ICD-10-CM | POA: Diagnosis not present

## 2015-03-10 DIAGNOSIS — Z9889 Other specified postprocedural states: Secondary | ICD-10-CM

## 2015-03-10 HISTORY — PX: UPPER GI ENDOSCOPY: SHX6162

## 2015-03-10 HISTORY — PX: INSERTION OF MESH: SHX5868

## 2015-03-10 HISTORY — PX: LAPAROSCOPIC NISSEN FUNDOPLICATION: SHX1932

## 2015-03-10 LAB — CBC
HCT: 38.6 % — ABNORMAL LOW (ref 39.0–52.0)
Hemoglobin: 12.2 g/dL — ABNORMAL LOW (ref 13.0–17.0)
MCH: 27.5 pg (ref 26.0–34.0)
MCHC: 31.6 g/dL (ref 30.0–36.0)
MCV: 86.9 fL (ref 78.0–100.0)
PLATELETS: 300 10*3/uL (ref 150–400)
RBC: 4.44 MIL/uL (ref 4.22–5.81)
RDW: 17.2 % — AB (ref 11.5–15.5)
WBC: 10.9 10*3/uL — AB (ref 4.0–10.5)

## 2015-03-10 LAB — CREATININE, SERUM
Creatinine, Ser: 1.18 mg/dL (ref 0.61–1.24)
GFR calc Af Amer: >60 mL/min (ref >60)
GFR calc non Af Amer: >60 mL/min (ref >60)

## 2015-03-10 SURGERY — FUNDOPLICATION, NISSEN, LAPAROSCOPIC
Anesthesia: General | Site: Esophagus

## 2015-03-10 MED ORDER — LIDOCAINE HCL (CARDIAC) 20 MG/ML IV SOLN
INTRAVENOUS | Status: AC
  Filled 2015-03-10: qty 5

## 2015-03-10 MED ORDER — PROPOFOL 10 MG/ML IV BOLUS
INTRAVENOUS | Status: DC | PRN
  Administered 2015-03-10: 160 mg via INTRAVENOUS

## 2015-03-10 MED ORDER — MIDAZOLAM HCL 2 MG/2ML IJ SOLN
INTRAMUSCULAR | Status: AC
  Filled 2015-03-10: qty 2

## 2015-03-10 MED ORDER — LIDOCAINE HCL (CARDIAC) 20 MG/ML IV SOLN
INTRAVENOUS | Status: DC | PRN
  Administered 2015-03-10: 50 mg via INTRAVENOUS

## 2015-03-10 MED ORDER — PHENYLEPHRINE HCL 10 MG/ML IJ SOLN
INTRAMUSCULAR | Status: DC | PRN
  Administered 2015-03-10 (×4): 80 ug via INTRAVENOUS

## 2015-03-10 MED ORDER — ONDANSETRON HCL 4 MG/2ML IJ SOLN: 4.0000 mg | Freq: Once | INTRAMUSCULAR | Status: DC | PRN

## 2015-03-10 MED ORDER — MEPERIDINE HCL 50 MG/ML IJ SOLN: 6.2500 mg | INTRAMUSCULAR | Status: DC | PRN

## 2015-03-10 MED ORDER — NEOSTIGMINE METHYLSULFATE 10 MG/10ML IV SOLN
INTRAVENOUS | Status: DC | PRN
  Administered 2015-03-10: 5 mg via INTRAVENOUS

## 2015-03-10 MED ORDER — ROCURONIUM BROMIDE 100 MG/10ML IV SOLN
INTRAVENOUS | Status: DC | PRN
  Administered 2015-03-10: 10 mg via INTRAVENOUS
  Administered 2015-03-10: 50 mg via INTRAVENOUS
  Administered 2015-03-10 (×5): 10 mg via INTRAVENOUS

## 2015-03-10 MED ORDER — ONDANSETRON HCL 4 MG/2ML IJ SOLN
INTRAMUSCULAR | Status: DC | PRN
  Administered 2015-03-10: 4 mg via INTRAVENOUS

## 2015-03-10 MED ORDER — CEFAZOLIN SODIUM-DEXTROSE 2-3 GM-% IV SOLR
INTRAVENOUS | Status: AC
  Filled 2015-03-10: qty 50

## 2015-03-10 MED ORDER — SODIUM CHLORIDE 0.9 % IJ SOLN
INTRAMUSCULAR | Status: AC
  Filled 2015-03-10: qty 50

## 2015-03-10 MED ORDER — SODIUM CHLORIDE 0.9 % IJ SOLN
INTRAMUSCULAR | Status: DC | PRN
  Administered 2015-03-10: 50 mL

## 2015-03-10 MED ORDER — LIP MEDEX EX OINT
TOPICAL_OINTMENT | CUTANEOUS | Status: AC
  Administered 2015-03-10: 16:00:00
  Filled 2015-03-10: qty 7

## 2015-03-10 MED ORDER — ACETAMINOPHEN 10 MG/ML IV SOLN
1000.0000 mg | Freq: Four times a day (QID) | INTRAVENOUS | Status: AC
  Administered 2015-03-10 – 2015-03-11 (×4): 1000 mg via INTRAVENOUS
  Filled 2015-03-10 (×4): qty 100

## 2015-03-10 MED ORDER — BUPIVACAINE LIPOSOME 1.3 % IJ SUSP
20.0000 mL | Freq: Once | INTRAMUSCULAR | Status: AC
  Administered 2015-03-10: 20 mL
  Filled 2015-03-10: qty 20

## 2015-03-10 MED ORDER — KCL IN DEXTROSE-NACL 20-5-0.45 MEQ/L-%-% IV SOLN
INTRAVENOUS | Status: DC
  Administered 2015-03-10: 100 mL/h via INTRAVENOUS
  Administered 2015-03-11 – 2015-03-12 (×3): via INTRAVENOUS
  Filled 2015-03-10 (×6): qty 1000

## 2015-03-10 MED ORDER — LACTATED RINGERS IV SOLN
INTRAVENOUS | Status: DC | PRN
  Administered 2015-03-10 (×3): via INTRAVENOUS

## 2015-03-10 MED ORDER — HEPARIN SODIUM (PORCINE) 5000 UNIT/ML IJ SOLN
5000.0000 [IU] | Freq: Three times a day (TID) | INTRAMUSCULAR | Status: DC
  Administered 2015-03-10 – 2015-03-12 (×5): 5000 [IU] via SUBCUTANEOUS
  Filled 2015-03-10 (×8): qty 1

## 2015-03-10 MED ORDER — 0.9 % SODIUM CHLORIDE (POUR BTL) OPTIME
TOPICAL | Status: DC | PRN
  Administered 2015-03-10: 1000 mL

## 2015-03-10 MED ORDER — CEFAZOLIN SODIUM-DEXTROSE 2-3 GM-% IV SOLR
2.0000 g | INTRAVENOUS | Status: AC
  Administered 2015-03-10: 2 g via INTRAVENOUS

## 2015-03-10 MED ORDER — HEPARIN SODIUM (PORCINE) 5000 UNIT/ML IJ SOLN
5000.0000 [IU] | Freq: Once | INTRAMUSCULAR | Status: AC
  Administered 2015-03-10: 5000 [IU] via SUBCUTANEOUS
  Filled 2015-03-10: qty 1

## 2015-03-10 MED ORDER — MIDAZOLAM HCL 5 MG/5ML IJ SOLN
INTRAMUSCULAR | Status: DC | PRN
  Administered 2015-03-10: 2 mg via INTRAVENOUS

## 2015-03-10 MED ORDER — GLYCOPYRROLATE 0.2 MG/ML IJ SOLN
INTRAMUSCULAR | Status: DC | PRN
  Administered 2015-03-10: .8 mg via INTRAVENOUS

## 2015-03-10 MED ORDER — STERILE WATER FOR IRRIGATION IR SOLN
Status: DC | PRN
  Administered 2015-03-10: 50 mL

## 2015-03-10 MED ORDER — LACTATED RINGERS IR SOLN
Status: DC | PRN
  Administered 2015-03-10: 1000 mL

## 2015-03-10 MED ORDER — FENTANYL CITRATE (PF) 100 MCG/2ML IJ SOLN
INTRAMUSCULAR | Status: DC | PRN
  Administered 2015-03-10: 50 ug via INTRAVENOUS
  Administered 2015-03-10 (×2): 100 ug via INTRAVENOUS

## 2015-03-10 MED ORDER — EPHEDRINE SULFATE 50 MG/ML IJ SOLN
INTRAMUSCULAR | Status: DC | PRN
  Administered 2015-03-10 (×3): 10 mg via INTRAVENOUS
  Administered 2015-03-10: 15 mg via INTRAVENOUS
  Administered 2015-03-10 (×2): 10 mg via INTRAVENOUS

## 2015-03-10 MED ORDER — ONDANSETRON HCL 4 MG/2ML IJ SOLN: 4.0000 mg | Freq: Four times a day (QID) | INTRAMUSCULAR | Status: DC | PRN

## 2015-03-10 MED ORDER — ONDANSETRON HCL 4 MG PO TABS: 4.0000 mg | ORAL_TABLET | Freq: Four times a day (QID) | ORAL | Status: DC | PRN

## 2015-03-10 MED ORDER — ONDANSETRON HCL 4 MG/2ML IJ SOLN
INTRAMUSCULAR | Status: AC
  Filled 2015-03-10: qty 2

## 2015-03-10 MED ORDER — HYDROMORPHONE HCL 1 MG/ML IJ SOLN: 0.2500 mg | INTRAMUSCULAR | Status: DC | PRN

## 2015-03-10 MED ORDER — CEFAZOLIN SODIUM 1-5 GM-% IV SOLN
1.0000 g | Freq: Four times a day (QID) | INTRAVENOUS | Status: AC
  Administered 2015-03-10 – 2015-03-11 (×3): 1 g via INTRAVENOUS
  Filled 2015-03-10 (×3): qty 50

## 2015-03-10 MED ORDER — FENTANYL CITRATE (PF) 250 MCG/5ML IJ SOLN
INTRAMUSCULAR | Status: AC
  Filled 2015-03-10: qty 5

## 2015-03-10 MED ORDER — TISSEEL VH 10 ML EX KIT
PACK | CUTANEOUS | Status: AC
  Filled 2015-03-10: qty 1

## 2015-03-10 MED ORDER — PROPOFOL 10 MG/ML IV BOLUS
INTRAVENOUS | Status: AC
  Filled 2015-03-10: qty 20

## 2015-03-10 MED ORDER — MORPHINE SULFATE 2 MG/ML IJ SOLN
1.0000 mg | INTRAMUSCULAR | Status: DC | PRN
  Administered 2015-03-10 – 2015-03-11 (×6): 1 mg via INTRAVENOUS
  Filled 2015-03-10 (×6): qty 1

## 2015-03-10 MED ORDER — CHLORHEXIDINE GLUCONATE 4 % EX LIQD: 1.0000 "application " | Freq: Once | CUTANEOUS | Status: DC

## 2015-03-10 SURGICAL SUPPLY — 57 items
APL SKNCLS STERI-STRIP NONHPOA (GAUZE/BANDAGES/DRESSINGS)
APPLIER CLIP ROT 10 11.4 M/L (STAPLE)
APR CLP MED LRG 11.4X10 (STAPLE)
BENZOIN TINCTURE PRP APPL 2/3 (GAUZE/BANDAGES/DRESSINGS) IMPLANT
CABLE HIGH FREQUENCY MONO STRZ (ELECTRODE) IMPLANT
CLAMP ENDO BABCK 10MM (STAPLE) IMPLANT
CLIP APPLIE ROT 10 11.4 M/L (STAPLE) IMPLANT
CLOSURE WOUND 1/2 X4 (GAUZE/BANDAGES/DRESSINGS)
DECANTER SPIKE VIAL GLASS SM (MISCELLANEOUS) ×5 IMPLANT
DEVICE SUT QUICK LOAD TK 5 (STAPLE) ×26 IMPLANT
DEVICE SUT TI-KNOT TK 5X26 (MISCELLANEOUS) ×5 IMPLANT
DEVICE SUTURE ENDOST 10MM (ENDOMECHANICALS) ×4 IMPLANT
DEVICE TI KNOT TK5 (MISCELLANEOUS) ×2
DISSECTOR BLUNT TIP ENDO 5MM (MISCELLANEOUS) ×5 IMPLANT
DRAIN PENROSE 18X1/2 LTX STRL (DRAIN) ×5 IMPLANT
DRAPE LAPAROSCOPIC ABDOMINAL (DRAPES) ×5 IMPLANT
DUPLOJECT EASY PREP 4ML (MISCELLANEOUS) ×2 IMPLANT
ELECT REM PT RETURN 9FT ADLT (ELECTROSURGICAL) ×5
ELECTRODE REM PT RTRN 9FT ADLT (ELECTROSURGICAL) ×3 IMPLANT
FILTER SMOKE EVAC LAPAROSHD (FILTER) IMPLANT
GLOVE BIOGEL M 8.0 STRL (GLOVE) ×7 IMPLANT
GOWN SPEC L4 XLG W/TWL (GOWN DISPOSABLE) ×5 IMPLANT
GOWN STRL REUS W/TWL XL LVL3 (GOWN DISPOSABLE) ×15 IMPLANT
GRASPER ENDO BABCOCK 10 (MISCELLANEOUS) IMPLANT
GRASPER ENDO BABCOCK 10MM (MISCELLANEOUS)
KIT BASIN OR (CUSTOM PROCEDURE TRAY) ×5 IMPLANT
LIQUID BAND (GAUZE/BANDAGES/DRESSINGS) ×2 IMPLANT
MESH HERNIA 7X10 (Mesh General) ×2 IMPLANT
PENCIL BUTTON HOLSTER BLD 10FT (ELECTRODE) IMPLANT
QUICK LOAD TK 5 (STAPLE) ×17
SCISSORS LAP 5X45 EPIX DISP (ENDOMECHANICALS) ×5 IMPLANT
SET IRRIG TUBING LAPAROSCOPIC (IRRIGATION / IRRIGATOR) ×5 IMPLANT
SHEARS CURVED HARMONIC AC 45CM (MISCELLANEOUS) ×2 IMPLANT
SHEARS HARMONIC ACE PLUS 45CM (MISCELLANEOUS) ×4 IMPLANT
SLEEVE ADV FIXATION 12X100MM (TROCAR) ×2 IMPLANT
SLEEVE ADV FIXATION 5X100MM (TROCAR) ×5 IMPLANT
SOLUTION ANTI FOG 6CC (MISCELLANEOUS) ×7 IMPLANT
STAPLER VISISTAT 35W (STAPLE) ×5 IMPLANT
STRIP CLOSURE SKIN 1/2X4 (GAUZE/BANDAGES/DRESSINGS) IMPLANT
SUT ETHIBOND 2 0 SH (SUTURE) ×25
SUT ETHIBOND 2 0 SH 36X2 (SUTURE) IMPLANT
SUT SURGIDAC NAB ES-9 0 48 120 (SUTURE) ×32 IMPLANT
SUT VIC AB 4-0 SH 18 (SUTURE) ×5 IMPLANT
TIP INNERVISION DETACH 40FR (MISCELLANEOUS) IMPLANT
TIP INNERVISION DETACH 50FR (MISCELLANEOUS) IMPLANT
TIP INNERVISION DETACH 56FR (MISCELLANEOUS) IMPLANT
TIPS INNERVISION DETACH 40FR (MISCELLANEOUS)
TOWEL OR 17X26 10 PK STRL BLUE (TOWEL DISPOSABLE) ×10 IMPLANT
TOWEL OR NON WOVEN STRL DISP B (DISPOSABLE) ×5 IMPLANT
TRAY FOLEY W/METER SILVER 14FR (SET/KITS/TRAYS/PACK) ×5 IMPLANT
TRAY LAPAROSCOPIC (CUSTOM PROCEDURE TRAY) ×5 IMPLANT
TROCAR ADV FIXATION 12X100MM (TROCAR) ×5 IMPLANT
TROCAR ADV FIXATION 5X100MM (TROCAR) ×13 IMPLANT
TROCAR BLADELESS OPT 5 100 (ENDOMECHANICALS) ×5 IMPLANT
TUBING CONNECTING 10 (TUBING) ×1 IMPLANT
TUBING CONNECTING 10' (TUBING) ×1
TUBING FILTER THERMOFLATOR (ELECTROSURGICAL) ×5 IMPLANT

## 2015-03-10 NOTE — Anesthesia Procedure Notes (Signed)
Procedure Name: Intubation Performed by: Gean Maidens Pre-anesthesia Checklist: Patient identified, Emergency Drugs available, Suction available, Patient being monitored and Timeout performed Patient Re-evaluated:Patient Re-evaluated prior to inductionOxygen Delivery Method: Circle system utilized Preoxygenation: Pre-oxygenation with 100% oxygen (RSI) Intubation Type: IV induction Laryngoscope Size: Mac and 4 Grade View: Grade I Tube type: Oral Tube size: 7.5 mm Number of attempts: 1 Airway Equipment and Method: Stylet Placement Confirmation: ETT inserted through vocal cords under direct vision,  positive ETCO2,  CO2 detector and breath sounds checked- equal and bilateral Secured at: 23 cm Tube secured with: Tape Dental Injury: Teeth and Oropharynx as per pre-operative assessment

## 2015-03-10 NOTE — H&P (Signed)
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 He was worked up by Dr. Herbie Baltimore Buccini thoroughly and it's felt that this is the cause of the anemia. In addition he's had significant GERD and has required dilatation on a couple of occasions. He came to the office and we discussed repair in detail and he wants to go ahead and proceed with that. We'll get an upper GI series before laparoscopic Nissen fundoplication.  Past Medical History  Diagnosis Date  . GERD (gastroesophageal reflux disease)   . Cancer     prostate   . Hypertension   . Abdominal distention     hernia  . Rash     yeast infection from bladder sling surgery   . Sciatica of right side 09-01-11    right leg-tx. Tylenol  . Lumbar herniated disc     L5    Past Surgical History  Procedure Laterality Date  . Hernia repair  September 01, 2010  . Incontinence surgery  Jan 31, 2011  . Prostatectomy  reb 9, 2010  . Ventral hernia repair  09/06/2011    Procedure: HERNIA REPAIR VENTRAL ADULT; Surgeon: Pedro Earls, MD; Location: [facility]; Service: General; Laterality: N/A;  . Ventral hernia repair  09/06/2011    Procedure: LAPAROSCOPIC VENTRAL HERNIA; Surgeon: Pedro Earls, MD; Location: [facility]; Service: General; Laterality: N/A;    Current Outpatient Prescriptions  Medication Sig Dispense Refill  . acetaminophen (TYLENOL) 500 MG tablet Take 500 mg by mouth every 6 (six) hours as needed.     . ALPRAZolam (XANAX) 0.5 MG tablet Take 0.25-0.5 mg by mouth at bedtime as needed. For sleep.    Marland Kitchen amLODipine (NORVASC) 10 MG tablet Take 10 mg by mouth daily before breakfast.     . atenolol (TENORMIN) 50 MG tablet Take 50 mg by mouth  daily before breakfast.     . ferrous fumarate (HEMOCYTE - 106 MG FE) 325 (106 FE) MG TABS Take 1 tablet by mouth daily.     . Omeprazole Magnesium (PRILOSEC OTC PO) Take by mouth daily.    . pravastatin (PRAVACHOL) 40 MG tablet Take 40 mg by mouth every evening.      No current facility-administered medications for this visit.   Ciprofloxacin Family History  Problem Relation Age of Onset  . Cancer Mother 84    lung    Social History:  reports that he has never smoked. He has never used smokeless tobacco. He reports that he drinks alcohol. He reports that he does not use illicit drugs.   REVIEW OF SYSTEMS : Negative except for review problem list  Physical Exam:  There were no vitals taken for this visit. There is no weight on file to calculate BMI.  Gen: WDWN white male NAD  Neurological: Alert and oriented to person, place, and time. Motor and sensory function is grossly intact  Head: Normocephalic and atraumatic.  Eyes: Conjunctivae are normal. Pupils are equal, round, and reactive to light. No scleral icterus.  Neck: Normal range of motion. Neck supple. No tracheal deviation or thyromegaly present.  Cardiovascular: SR without murmurs or gallops. No carotid bruits Breast: Not examined Respiratory: Effort normal. No respiratory distress. No chest wall tenderness. Breath sounds normal. No wheezes, rales or rhonchi.  Abdomen: Incision from prior  hernia repair by me. He has also had a sling for urinary incontinence. GU: Sling for urinary incontinence Musculoskeletal: Normal range of motion. Extremities are nontender. No cyanosis, edema or clubbing noted Lymphadenopathy: No cervical, preauricular, postauricular or axillary adenopathy is present Skin: Skin is warm and dry. No rash noted. No diaphoresis. No erythema. No pallor. Pscyh: Normal mood and affect. Behavior is normal. Judgment and thought content normal.   LABORATORY RESULTS:   Lab Results Last 48 Hours    No results found for this or any previous visit (from the past 48 hour(s)).     RADIOLOGY RESULTS:  Imaging Results (Last 48 hours)    No results found.    Problem List: Patient Active Problem List   Diagnosis Date Noted  . Ventral hernia-s/p repair x 2 09/07/2011  . History of prostate cancer-s/p prostatectomy 09/07/2011  . GERD (gastroesophageal reflux disease)-with hiatus hernia 09/07/2011    Assessment & Plan: Symptomatic type III mixed hiatal hernia. Plan laparoscopic repair.    Matt B. Hassell Done, MD, [facility] [phone] beeper [phone]

## 2015-03-10 NOTE — Anesthesia Preprocedure Evaluation (Signed)
Anesthesia Evaluation  Patient identified by MRN, date of birth, ID band Patient awake    Reviewed: Allergy & Precautions, NPO status , Patient's Chart, lab work & pertinent test results  Airway Mallampati: I  TM Distance: >3 FB Neck ROM: Full    Dental   Pulmonary    Pulmonary exam normal       Cardiovascular hypertension, Pt. on medications Normal cardiovascular exam    Neuro/Psych    GI/Hepatic GERD-  Medicated and Controlled,  Endo/Other    Renal/GU      Musculoskeletal   Abdominal   Peds  Hematology   Anesthesia Other Findings   Reproductive/Obstetrics                             Anesthesia Physical Anesthesia Plan  ASA: II  Anesthesia Plan: General   Post-op Pain Management:    Induction: Intravenous  Airway Management Planned: Oral ETT  Additional Equipment:   Intra-op Plan:   Post-operative Plan: Extubation in OR  Informed Consent: I have reviewed the patients History and Physical, chart, labs and discussed the procedure including the risks, benefits and alternatives for the proposed anesthesia with the patient or authorized representative who has indicated his/her understanding and acceptance.     Plan Discussed with: CRNA and Surgeon  Anesthesia Plan Comments:         Anesthesia Quick Evaluation

## 2015-03-10 NOTE — Transfer of Care (Signed)
Immediate Anesthesia Transfer of Care Note  Patient: Troy [name]  Procedure(s) Performed: Procedure(s): LAPAROSCOPIC NISSEN FUNDOPLICATION AND HIATAL HERNIA REPAIR (N/A) UPPER GI ENDOSCOPY (N/A) INSERTION OF MESH  Patient Location: PACU  Anesthesia Type:General  Level of Consciousness: awake and alert   Airway & Oxygen Therapy: Patient Spontanous Breathing and Patient connected to face mask oxygen  Post-op Assessment: Report given to RN and Post -op Vital signs reviewed and stable  Post vital signs: Reviewed and stable  Last Vitals:  Filed Vitals:   03/10/15 0707  BP: 120/81  Pulse: 62  Temp: 37 C  Resp: 18    Complications: No apparent anesthesia complications

## 2015-03-10 NOTE — Interval H&P Note (Signed)
History and Physical Interval Note:  03/10/2015 8:47 AM  Troy [name]  has presented today for surgery, with the diagnosis of TYPE 3 MIXED HIATUS HERNIA  The various methods of treatment have been discussed with the patient and family. After consideration of risks, benefits and other options for treatment, the patient has consented to  Procedure(s): [place] (N/A) as a surgical intervention .  The patient's history has been reviewed, patient examined, no change in status, stable for surgery.  I have reviewed the patient's chart and labs.  Questions were answered to the patient's satisfaction.     Cassadie Pankonin B

## 2015-03-10 NOTE — Op Note (Signed)
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  After anesthesia was administered and the patient was prepped a timeout was performed.  Access to the abdomen Was achieved with a 5 mm Optiview through the left upper quadrant through a previous 5 mm trocar site. Following insufflation the adhesions were noted from the omentum up to the prior physio-mesh repair the midline. These were taken down with the Harmonic scalpel. Once free were able to place remaining trochars for the operation including a 12 just to the right of the midline and lateral to the mesh placed obliquely and then the rest being 5 mm total of 6. The Nathanson retractor was placed through all of the upper midline to retract the liver. Approximately the upper third of the stomach was involved in this hernia. The complicating factors were he had a very large fatty sac. The approach began on the right side taking down the gastric hepatic window and then trying to get the stomach down out of the chest and incise the peritoneum along the right crus carrying this anteriorly. This was carried over to the left side. We took down some short gastrics and dissected up into the sac on the left side. We then worked down posteriorly ways.  I was able to get around behind the stomach and esophagus with the finger and then passed a Penrose about this. With this retraction I was able to take down more the adhesions in the chest. He also had a peel-away some fat from the distal esophagus. We felt we had this adequately reduced  Dr. Redmond Pulling 1 up to endoscope the patient. This revealed that the esophagogastric junction was well below the diaphragm.  I then debulked the sac taking small pieces using the Harmonic scalpel to get it freed up as much of the bulky sac as I could.  Matt this down mobilized and debulked visually I elected to go ahead and repair the diaphragm. I did this with 6 interrupted sutures placed in the right and left crura which I could see nicely and secure with tie knots. Following this placement I then repaired in buttress this with a piece of Cook biological hernia diaphragm mesh placed around with the waist across posteriorly and the 2 legs going up anteriorly. These were secured with 4 freehand stitches of 20 [place] with tie knots.  Following this we then passed the 56 bougie into the stomach using the lighted bougie. I brought around contiguous portions of this stomach posteriorly and an initial stitch which I took out and then I placed 3 stitches and then did not like the way deep purchase was on the top stitch removed then placed another stitch for inferiorly. The stomach both on the wrap side and the left side will look good and healthy and following the completion I placed Tisseel along this area as well as up at some to help secure the [facility] biological mesh on the diaphragm. The 56 dilator was withdrawn without difficulty.  Everything looked to be in order. There was no bleeding noted. Right  taking down adhesions was all hemostatic. A survey the abdomen everything appeared to be in order. After taking down the omental adhesions via physio-mesh appeared to be holding its position and the hernia repair defect repair intact. The Sherrie Sport was removed. All port sites were injected with ex Brill and then tablet was deflated and the wounds were closed with 4-0 Vicryl and liquid band.   The patient tolerated the procedure well and was taken to the PACU in stable condition.     Matt B. Hassell Done, [place],  [facility], [place]

## 2015-03-11 ENCOUNTER — Encounter (HOSPITAL_COMMUNITY): Payer: Self-pay | Admitting: Surgery

## 2015-03-11 ENCOUNTER — Inpatient Hospital Stay (HOSPITAL_COMMUNITY): Payer: Medicare Other

## 2015-03-11 LAB — CBC
HCT: 34.3 % — ABNORMAL LOW (ref 39.0–52.0)
HEMOGLOBIN: 11.1 g/dL — AB (ref 13.0–17.0)
MCH: 28.3 pg (ref 26.0–34.0)
MCHC: 32.4 g/dL (ref 30.0–36.0)
MCV: 87.5 fL (ref 78.0–100.0)
Platelets: 281 10*3/uL (ref 150–400)
RBC: 3.92 MIL/uL — AB (ref 4.22–5.81)
RDW: 17.6 % — ABNORMAL HIGH (ref 11.5–15.5)
WBC: 6.6 10*3/uL (ref 4.0–10.5)

## 2015-03-11 LAB — BASIC METABOLIC PANEL
Anion gap: 6 (ref 5–15)
BUN: 17 mg/dL (ref 6–20)
CO2: 27 mmol/L (ref 22–32)
Calcium: 7.8 mg/dL — ABNORMAL LOW (ref 8.9–10.3)
Chloride: 104 mmol/L (ref 101–111)
Creatinine, Ser: 1.04 mg/dL (ref 0.61–1.24)
GFR calc non Af Amer: >60 mL/min (ref >60)
GLUCOSE: 129 mg/dL — AB (ref 65–99)
Potassium: 3.5 mmol/L (ref 3.5–5.1)
Sodium: 137 mmol/L (ref 135–145)

## 2015-03-11 MED ORDER — ZOLPIDEM TARTRATE 5 MG PO TABS
5.0000 mg | ORAL_TABLET | Freq: Every evening | ORAL | Status: DC | PRN
  Administered 2015-03-12: 5 mg via ORAL
  Filled 2015-03-11: qty 1

## 2015-03-11 MED ORDER — ZOLPIDEM TARTRATE 5 MG PO TABS: 5.0000 mg | ORAL_TABLET | Freq: Every evening | ORAL | Status: DC | PRN

## 2015-03-11 MED ORDER — IOHEXOL 300 MG/ML  SOLN
50.0000 mL | Freq: Once | INTRAMUSCULAR | Status: AC | PRN
  Administered 2015-03-11: 50 mL via ORAL

## 2015-03-11 NOTE — Progress Notes (Addendum)
Patient ID: Troy [name], male   DOB: [birth year], 69 y.o.   MRN: [id number] [facility] Surgery Progress Note:   1 Day Post-Op  Subjective: Mental status is clear.  Minimal pain Objective: Vital signs in last 24 hours: Temp:  [97.1 F (36.2 C)-98.4 F (36.9 C)] 98.3 F (36.8 C) (06/30 0555) Pulse Rate:  [62-90] 62 (06/30 0555) Resp:  [16-22] 18 (06/30 0555) BP: (118-146)/(66-84) 118/69 mmHg (06/30 0555) SpO2:  [93 %-99 %] 95 % (06/30 0555) Weight:  [97.523 kg (215 lb)] 97.523 kg (215 lb) (06/29 0735)  Intake/Output from previous day: 06/29 0701 - 06/30 0700 In: 3913.3 [I.V.:3913.3] Out: 3125 [Urine:3100; Blood:25] Intake/Output this shift:    Physical Exam: Work of breathing is not elevated.  Incisions OK  Lab Results:  Results for orders placed or performed during the hospital encounter of 03/10/15 (from the past 48 hour(s))  CBC     Status: Abnormal   Collection Time: 03/10/15  3:25 PM  Result Value Ref Range   WBC 10.9 (H) 4.0 - 10.5 K/uL   RBC 4.44 4.22 - 5.81 MIL/uL   Hemoglobin 12.2 (L) 13.0 - 17.0 g/dL   HCT 38.6 (L) 39.0 - 52.0 %   MCV 86.9 78.0 - 100.0 fL   MCH 27.5 26.0 - 34.0 pg   MCHC 31.6 30.0 - 36.0 g/dL   RDW 17.2 (H) 11.5 - 15.5 %   Platelets 300 150 - 400 K/uL  Creatinine, serum     Status: None   Collection Time: 03/10/15  3:25 PM  Result Value Ref Range   Creatinine, Ser 1.18 0.61 - 1.24 mg/dL   GFR calc non Af Amer >60 >60 mL/min   GFR calc Af Amer >60 >60 mL/min    Comment: (NOTE) The eGFR has been calculated using the CKD EPI equation. This calculation has not been validated in all clinical situations. eGFR's persistently <60 mL/min signify possible Chronic Kidney Disease.   CBC     Status: Abnormal   Collection Time: 03/11/15  5:10 AM  Result Value Ref Range   WBC 6.6 4.0 - 10.5 K/uL   RBC 3.92 (L) 4.22 - 5.81 MIL/uL   Hemoglobin 11.1 (L) 13.0 - 17.0 g/dL   HCT 34.3 (L) 39.0 - 52.0 %   MCV 87.5 78.0 - 100.0 fL   MCH 28.3 26.0 -  34.0 pg   MCHC 32.4 30.0 - 36.0 g/dL   RDW 17.6 (H) 11.5 - 15.5 %   Platelets 281 150 - 400 K/uL  Basic metabolic panel     Status: Abnormal   Collection Time: 03/11/15  5:10 AM  Result Value Ref Range   Sodium 137 135 - 145 mmol/L   Potassium 3.5 3.5 - 5.1 mmol/L   Chloride 104 101 - 111 mmol/L   CO2 27 22 - 32 mmol/L   Glucose, Bld 129 (H) 65 - 99 mg/dL   BUN 17 6 - 20 mg/dL   Creatinine, Ser 1.04 0.61 - 1.24 mg/dL   Calcium 7.8 (L) 8.9 - 10.3 mg/dL   GFR calc non Af Amer >60 >60 mL/min   GFR calc Af Amer >60 >60 mL/min    Comment: (NOTE) The eGFR has been calculated using the CKD EPI equation. This calculation has not been validated in all clinical situations. eGFR's persistently <60 mL/min signify possible Chronic Kidney Disease.    Anion gap 6 5 - 15    Radiology/Results: No results found.  Anti-infectives: Anti-infectives    Start  Dose/Rate Route Frequency Ordered Stop   03/10/15 1500  ceFAZolin (ANCEF) IVPB 1 g/50 mL premix     1 g 100 mL/hr over 30 Minutes Intravenous Every 6 hours 03/10/15 1403 03/11/15 0404   03/10/15 0730  ceFAZolin (ANCEF) IVPB 2 g/50 mL premix     2 g 100 mL/hr over 30 Minutes Intravenous On call to O.R. 03/10/15 0718 03/10/15 0946      Assessment/Plan: Problem List: Patient Active Problem List   Diagnosis Date Noted  . Status post Nissen fundoplication 44/92/0100  . Ventral hernia-s/p repair x 2 09/07/2011  . History of prostate cancer-s/p prostatectomy 09/07/2011  . GERD (gastroesophageal reflux disease)-with hiatus hernia 09/07/2011    UGI pending.  Stable postop 1 Day Post-Op    LOS: 1 day   Matt B. Hassell Done, MD, [facility] [phone] beeper [phone]  03/11/2015 7:13 AM Swallow looks good.  Begin clear liquids PO

## 2015-03-11 NOTE — Progress Notes (Signed)
Utilization review completed.  

## 2015-03-11 NOTE — Anesthesia Postprocedure Evaluation (Signed)
Anesthesia Post Note  Patient: Troy [name]  Procedure(s) Performed: Procedure(s) (LRB): LAPAROSCOPIC NISSEN FUNDOPLICATION AND HIATAL HERNIA REPAIR (N/A) UPPER GI ENDOSCOPY (N/A) INSERTION OF MESH  Anesthesia type: general  Patient location: PACU  Post pain: Pain level controlled  Post assessment: Patient's Cardiovascular Status Stable  Last Vitals:  Filed Vitals:   03/11/15 0555  BP: 118/69  Pulse: 62  Temp: 36.8 C  Resp: 18    Post vital signs: Reviewed and stable  Level of consciousness: awake  Complications: No apparent anesthesia complications

## 2015-03-12 LAB — BASIC METABOLIC PANEL
ANION GAP: 5 (ref 5–15)
BUN: 10 mg/dL (ref 6–20)
CO2: 26 mmol/L (ref 22–32)
CREATININE: 0.94 mg/dL (ref 0.61–1.24)
Calcium: 8.1 mg/dL — ABNORMAL LOW (ref 8.9–10.3)
Chloride: 106 mmol/L (ref 101–111)
GFR calc Af Amer: >60 mL/min (ref >60)
GFR calc non Af Amer: >60 mL/min (ref >60)
GLUCOSE: 128 mg/dL — AB (ref 65–99)
Potassium: 3.7 mmol/L (ref 3.5–5.1)
Sodium: 137 mmol/L (ref 135–145)

## 2015-03-12 LAB — CBC WITH DIFFERENTIAL/PLATELET
BASOS PCT: 0 % (ref 0–1)
Basophils Absolute: 0 10*3/uL (ref 0.0–0.1)
EOS ABS: 0.1 10*3/uL (ref 0.0–0.7)
Eosinophils Relative: 2 % (ref 0–5)
HCT: 34.2 % — ABNORMAL LOW (ref 39.0–52.0)
Hemoglobin: 11 g/dL — ABNORMAL LOW (ref 13.0–17.0)
Lymphocytes Relative: 12 % (ref 12–46)
Lymphs Abs: 0.7 10*3/uL (ref 0.7–4.0)
MCH: 28.3 pg (ref 26.0–34.0)
MCHC: 32.2 g/dL (ref 30.0–36.0)
MCV: 87.9 fL (ref 78.0–100.0)
Monocytes Absolute: 0.7 10*3/uL (ref 0.1–1.0)
Monocytes Relative: 11 % (ref 3–12)
NEUTROS ABS: 4.7 10*3/uL (ref 1.7–7.7)
Neutrophils Relative %: 75 % (ref 43–77)
PLATELETS: 270 10*3/uL (ref 150–400)
RBC: 3.89 MIL/uL — ABNORMAL LOW (ref 4.22–5.81)
RDW: 17.5 % — ABNORMAL HIGH (ref 11.5–15.5)
WBC: 6.2 10*3/uL (ref 4.0–10.5)

## 2015-03-12 MED ORDER — HYDROCODONE-ACETAMINOPHEN 7.5-325 MG/15ML PO SOLN: 15.0000 mL | ORAL | Status: DC | PRN

## 2015-03-12 NOTE — Discharge Summary (Signed)
Physician Discharge Summary  Patient ID: Troy [name] MRN: [id number] DOB/AGE: 69-Nov-1947 69 y.o.  Admit date: 03/10/2015 Discharge date: 03/12/2015  Admission Diagnoses:  Large type III mixed hiatus hernia with Troy [name] ulcers and anemia  Discharge Diagnoses:  same  Principal Problem:   Status post laparoscopic Nissen fundoplication and repair of large hiatus hernia June 2016   Surgery:  Lap repair of hiatus hernia with mesh patch and Nissen fundoplication over a 56 dilator  Discharged Condition: improved  Hospital Course:   Had surgery.  UGI on PD 1 looked good.  Incisions ok.  Taking liquids ok.  Labs ok.  Ready for discharge  Consults: none  Significant Diagnostic Studies: UGI    Discharge Exam: Blood pressure 127/78, pulse 64, temperature 98.4 F (36.9 C), temperature source Oral, resp. rate 18, height 5\' 9"  (1.753 m), weight 97.523 kg (215 lb), SpO2 94 %. Minimal pain with Exparel injected in trocar sites  Disposition: 01-Home or Self Care  Discharge Instructions    Diet Carb Modified    Complete by:  As directed   Restrict carbs to help with weight loss Diet should be liquids for the next week and then pureed foods (blenderized) for four weeks.     Discharge wound care:    Complete by:  As directed   Shower ad lib     Increase activity slowly    Complete by:  As directed      Lifting restrictions    Complete by:  As directed   Avoid lifting that produces straining            Medication List    TAKE these medications        ALIGN PO  Take 1 capsule by mouth every morning.     ALPRAZolam 0.5 MG tablet  Commonly known as:  XANAX  Take 0.5 mg by mouth at bedtime as needed for anxiety. For sleep.     amLODipine 5 MG tablet  Commonly known as:  NORVASC  Take 5 mg by mouth every morning.     atenolol 50 MG tablet  Commonly known as:  TENORMIN  Take 50 mg by mouth daily before breakfast.     Co Q 10 100 MG Caps  Take 1 capsule by mouth at bedtime.      hydrochlorothiazide 12.5 MG capsule  Commonly known as:  MICROZIDE  Take 12.5 mg by mouth every morning.     HYDROcodone-acetaminophen 7.5-325 mg/15 ml solution  Commonly known as:  HYCET  Take 15 mLs by mouth every 4 (four) hours as needed for moderate pain.     loperamide 2 MG tablet  Commonly known as:  IMODIUM A-D  Take 2 mg by mouth 4 (four) times daily as needed for diarrhea or loose stools.     Loratadine 10 MG Caps  Take by mouth. Patient takes as needed for allergies     losartan 100 MG tablet  Commonly known as:  COZAAR  Take 100 mg by mouth every morning.     Melatonin 5 MG Caps  Take 10 mg by mouth at bedtime.     MURINE TEARS FOR DRY EYES OP  Apply 1-2 drops to eye daily as needed (dry eyes.).     omeprazole 20 MG capsule  Commonly known as:  PRILOSEC  Take 20 mg by mouth daily as needed (heart burn.).     POLY-IRON 150 150 MG capsule  Generic drug:  iron polysaccharides  Take 150 mg by  mouth 2 (two) times daily.     pravastatin 40 MG tablet  Commonly known as:  PRAVACHOL  Take 40 mg by mouth at bedtime.     Vitamin D3 2000 UNITS Tabs  Take 1 tablet by mouth every morning.           Follow-up Information    Follow up with Johnathan Hausen B, MD. Schedule an appointment as soon as possible for a visit in 4 weeks.   Specialty:  General Surgery   Contact information:   [address] [place] [address] [phone]       Signed: Pedro Earls 03/12/2015, 8:59 AM

## 2015-05-07 ENCOUNTER — Other Ambulatory Visit: Payer: Self-pay | Admitting: Gastroenterology

## 2016-11-22 ENCOUNTER — Ambulatory Visit (INDEPENDENT_AMBULATORY_CARE_PROVIDER_SITE_OTHER): Payer: Medicare Other | Admitting: Family

## 2016-11-22 ENCOUNTER — Ambulatory Visit (INDEPENDENT_AMBULATORY_CARE_PROVIDER_SITE_OTHER): Payer: Medicare Other

## 2016-11-22 DIAGNOSIS — M25571 Pain in right ankle and joints of right foot: Secondary | ICD-10-CM

## 2016-11-22 DIAGNOSIS — M7661 Achilles tendinitis, right leg: Secondary | ICD-10-CM | POA: Diagnosis not present

## 2016-11-22 MED ORDER — NITROGLYCERIN 0.2 MG/HR TD PT24: 0.2000 mg | MEDICATED_PATCH | Freq: Every day | TRANSDERMAL | 4 refills | Status: DC

## 2016-11-22 NOTE — Progress Notes (Signed)
Office Visit Note   Patient: Troy [name]           Date of Birth: 1945/11/21           MRN: [id number] Visit Date: 11/22/2016              Requested by: Derinda Late, MD [address] [place], [place] [address] PCP: Marylene Land, MD  Chief Complaint  Patient presents with  . Right Ankle - Pain    HPI: Patient is here today for posterior right ankle pain with radiating pain up to right calf.  Patient was walking approximately 6 months and felt an intense pain in posterior right ankle. Patient is not taking any medication for pain, hedid purchase a ankle wrap to wear while walking which helped.  Patient states pain has continued and has not improved.  The patient is a 71 year old gentleman who presents today for evaluation of right ankle pain. States the pain is mostly posterior. Points to his Achilles states this pain radiates up his calf. About 6 months ago while walking he felt intense pain to the Achilles area. Has not had this evaluated. Is not using any over-the-counter for pain. Does use an ankle wrap for stability this helped some.    Assessment & Plan: Visit Diagnoses:  1. Achilles tendinitis, right leg   2. Pain in right ankle and joints of right foot     Plan: Have provided 9/16th heel lift for shoe wear. Will have him begin applying Nitro patches to achilles daily. Naproxen bid x 2 weeks. Follow up in office in 3 weeks.   Follow-Up Instructions: Return in about 4 weeks (around 12/20/2016).   Physical Exam  Constitutional: Appears well-developed.  Head: Normocephalic.  Eyes: EOM are normal.  Neck: Normal range of motion.  Cardiovascular: Normal rate.   Pulmonary/Chest: Effort normal.  Neurological: Is alert.  Skin: Skin is warm.  Psychiatric: Has a normal mood and affect.  Right Ankle Exam  Swelling: moderate (Posterior him around him insertion of Achilles)  Tenderness  Right ankle tenderness location: Achilles.    Range of Motion  The  patient has normal right ankle ROM.  Muscle Strength  The patient has normal right ankle strength.  Comments:  No palpable defect, does have tender knot just proximal to insertion of achilles    no heel cord tightness.  Imaging: No results found.  Labs: No results found for: HGBA1C, ESRSEDRATE, CRP, LABURIC, REPTSTATUS, GRAMSTAIN, CULT, LABORGA  Orders:  Orders Placed This Encounter  Procedures  . XR Ankle Complete Right   Meds ordered this encounter  Medications  . nitroGLYCERIN (NITRODUR - DOSED IN MG/24 HR) 0.2 mg/hr patch    Sig: Place 1 patch (0.2 mg total) onto the skin daily.    Dispense:  30 patch    Refill:  4     Procedures: No procedures performed  Clinical Data: No additional findings.  Subjective: Review of Systems  Constitutional: Negative for chills and fever.  Cardiovascular: Negative for leg swelling.  Musculoskeletal: Positive for joint swelling and myalgias.    Objective: Vital Signs: There were no vitals taken for this visit.  Specialty Comments:  No specialty comments available.  PMFS History: Patient Active Problem List   Diagnosis Date Noted  . Status post laparoscopic Nissen fundoplication and repair of large hiatus hernia June 2016 03/10/2015  . Ventral hernia-s/p repair x 2 09/07/2011  . History of prostate cancer-s/p prostatectomy 09/07/2011   Past Medical History:  Diagnosis  Date  . Abdominal distention    hernia  . Anemia   . Arthritis    big toes   . Cancer    prostate   . GERD (gastroesophageal reflux disease)   . Hypertension   . Lumbar herniated disc    L5  . Rash    yeast infection from bladder sling surgery   . Sciatica of right side 09-01-11   right leg-tx. Tylenol    Family History  Problem Relation Age of Onset  . Cancer Mother 75    lung     Past Surgical History:  Procedure Laterality Date  . HERNIA REPAIR  September 01, 2010  . INCONTINENCE SURGERY  Jan 31, 2011  . INSERTION OF MESH  03/10/2015    Procedure: INSERTION OF MESH;  Surgeon: Johnathan Hausen, MD;  Location: [facility];  Service: General;;  . LAPAROSCOPIC NISSEN FUNDOPLICATION N/A 01/02/5360   Procedure: LAPAROSCOPIC NISSEN FUNDOPLICATION AND HIATAL HERNIA REPAIR;  Surgeon: Johnathan Hausen, MD;  Location: [facility];  Service: General;  Laterality: N/A;  . PROSTATECTOMY  reb 9, 2010  . TONSILLECTOMY    . UPPER GI ENDOSCOPY N/A 03/10/2015   Procedure: UPPER GI ENDOSCOPY;  Surgeon: Johnathan Hausen, MD;  Location: [facility];  Service: General;  Laterality: N/A;  . VENTRAL HERNIA REPAIR  09/06/2011   Procedure: HERNIA REPAIR VENTRAL ADULT;  Surgeon: Pedro Earls, MD;  Location: [facility];  Service: General;  Laterality: N/A;  . VENTRAL HERNIA REPAIR  09/06/2011   Procedure: LAPAROSCOPIC VENTRAL HERNIA;  Surgeon: Pedro Earls, MD;  Location: [facility];  Service: General;  Laterality: N/A;   Social History   Occupational History  . Not on file.   Social History Main Topics  . Smoking status: Never Smoker  . Smokeless tobacco: Never Used  . Alcohol use Yes     Comment: occ. alcohol  . Drug use: No  . Sexual activity: Yes

## 2017-11-09 ENCOUNTER — Ambulatory Visit (INDEPENDENT_AMBULATORY_CARE_PROVIDER_SITE_OTHER): Payer: Medicare Other

## 2017-11-09 ENCOUNTER — Ambulatory Visit (INDEPENDENT_AMBULATORY_CARE_PROVIDER_SITE_OTHER): Payer: Medicare Other | Admitting: Orthopaedic Surgery

## 2017-11-09 ENCOUNTER — Encounter (INDEPENDENT_AMBULATORY_CARE_PROVIDER_SITE_OTHER): Payer: Self-pay | Admitting: Orthopaedic Surgery

## 2017-11-09 DIAGNOSIS — M1712 Unilateral primary osteoarthritis, left knee: Secondary | ICD-10-CM | POA: Diagnosis not present

## 2017-11-09 MED ORDER — DICLOFENAC SODIUM 1 % TD GEL: 2.0000 g | Freq: Four times a day (QID) | TRANSDERMAL | 5 refills | Status: DC

## 2017-11-09 NOTE — Progress Notes (Addendum)
Office Visit Note   Patient: Troy [name]           Date of Birth: 1946-07-13           MRN: [id number] Visit Date: 11/09/2017              Requested by: Derinda Late, MD [address] [place], [place] [address] PCP: Derinda Late, MD   Assessment & Plan: Visit Diagnoses:  1. Primary osteoarthritis of left knee     Plan: Impression is left knee degenerative joint disease worse in the medial compartment.  We reviewed the x-rays today.  We briefly discussed injections which she is not interested in at this point.  Prescription for Voltaren gel.  Prescription for medial unloader brace as most of his arthritis is medial and is most symptomatic in this region.  Questions encouraged and answered.  Follow-up as needed.  Follow-Up Instructions: Return if symptoms worsen or fail to improve.   Orders:  Orders Placed This Encounter  Procedures  . XR KNEE 3 VIEW LEFT   Meds ordered this encounter  Medications  . diclofenac sodium (VOLTAREN) 1 % GEL    Sig: Apply 2 g topically 4 (four) times daily.    Dispense:  1 Tube    Refill:  5      Procedures: No procedures performed   Clinical Data: No additional findings.   Subjective: Chief Complaint  Patient presents with  . Left Knee - Pain    Patient is a very pleasant 72 year old gentleman with mainly medial left knee pain for 6 months with insidious and gradual onset.  Denies any injuries.  He has not worn any braces or had any injections.  Denies any mechanical symptoms or swelling.  Denies any radiation of pain.  Denies any numbness and tingling.    Review of Systems  Constitutional: Negative.   All other systems reviewed and are negative.    Objective: Vital Signs: There were no vitals taken for this visit.  Physical Exam  Constitutional: He is oriented to person, place, and time. He appears well-developed and well-nourished.  HENT:  Head: Normocephalic and atraumatic.  Eyes: Pupils are equal, round,  and reactive to light.  Neck: Neck supple.  Pulmonary/Chest: Effort normal.  Abdominal: Soft.  Musculoskeletal: Normal range of motion.  Neurological: He is alert and oriented to person, place, and time.  Skin: Skin is warm.  Psychiatric: He has a normal mood and affect. His behavior is normal. Judgment and thought content normal.  Nursing note and vitals reviewed.   Ortho Exam Left knee exam shows medial joint line tenderness.  No joint effusion.  Collaterals and cruciates are stable with mild laxity to valgus stress.  Normal range of motion.  1+ patellar crepitus. Specialty Comments:  No specialty comments available.  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 Status post laparoscopic Nissen fundoplication and repair of large hiatus hernia June 2016 03/10/2015  . Ventral hernia-s/p repair x 2 09/07/2011  . History of prostate cancer-s/p prostatectomy 09/07/2011   Past Medical History:  Diagnosis Date  . Abdominal distention    hernia  . Anemia   . Arthritis    big toes   . Cancer [facility])    prostate   . GERD (gastroesophageal reflux disease)   .  Hypertension   . Lumbar herniated disc    L5  . Rash    yeast infection from bladder sling surgery   . Sciatica of right side 09-01-11   right leg-tx. Tylenol    Family History  Problem Relation Age of Onset  . Cancer Mother 26       lung     Past Surgical History:  Procedure Laterality Date  . HERNIA REPAIR  September 01, 2010  . INCONTINENCE SURGERY  Jan 31, 2011  . INSERTION OF MESH  03/10/2015   Procedure: INSERTION OF MESH;  Surgeon: Johnathan Hausen, MD;  Location: [facility];  Service: General;;  . LAPAROSCOPIC NISSEN FUNDOPLICATION N/A 03/14/8888   Procedure: LAPAROSCOPIC NISSEN FUNDOPLICATION AND HIATAL  HERNIA REPAIR;  Surgeon: Johnathan Hausen, MD;  Location: [facility];  Service: General;  Laterality: N/A;  . PROSTATECTOMY  reb 9, 2010  . TONSILLECTOMY    . UPPER GI ENDOSCOPY N/A 03/10/2015   Procedure: UPPER GI ENDOSCOPY;  Surgeon: Johnathan Hausen, MD;  Location: [facility];  Service: General;  Laterality: N/A;  . VENTRAL HERNIA REPAIR  09/06/2011   Procedure: HERNIA REPAIR VENTRAL ADULT;  Surgeon: Pedro Earls, MD;  Location: [facility];  Service: General;  Laterality: N/A;  . VENTRAL HERNIA REPAIR  09/06/2011   Procedure: LAPAROSCOPIC VENTRAL HERNIA;  Surgeon: Pedro Earls, MD;  Location: [facility];  Service: General;  Laterality: N/A;   Social History   Occupational History  . Not on file  Tobacco Use  . Smoking status: Never Smoker  . Smokeless tobacco: Never Used  Substance and Sexual Activity  . Alcohol use: Yes    Comment: occ. alcohol  . Drug use: No  . Sexual activity: Yes

## 2017-12-17 ENCOUNTER — Telehealth (INDEPENDENT_AMBULATORY_CARE_PROVIDER_SITE_OTHER): Payer: Self-pay | Admitting: Orthopaedic Surgery

## 2017-12-17 NOTE — Telephone Encounter (Signed)
Patient called wanting to get Troy [name]'s number, he states that he needed to speak with him about his leg brace. If you had his number? CB # [phone]

## 2017-12-18 NOTE — Telephone Encounter (Signed)
Emailed Human resources officer and [place]. I will call patient later when I get a response.

## 2018-01-11 ENCOUNTER — Encounter (INDEPENDENT_AMBULATORY_CARE_PROVIDER_SITE_OTHER): Payer: Self-pay | Admitting: Orthopaedic Surgery

## 2018-01-11 ENCOUNTER — Ambulatory Visit (INDEPENDENT_AMBULATORY_CARE_PROVIDER_SITE_OTHER): Payer: Medicare Other

## 2018-01-11 ENCOUNTER — Ambulatory Visit (INDEPENDENT_AMBULATORY_CARE_PROVIDER_SITE_OTHER): Payer: Medicare Other | Admitting: Orthopaedic Surgery

## 2018-01-11 DIAGNOSIS — M25551 Pain in right hip: Secondary | ICD-10-CM | POA: Diagnosis not present

## 2018-01-11 DIAGNOSIS — M1712 Unilateral primary osteoarthritis, left knee: Secondary | ICD-10-CM

## 2018-01-11 DIAGNOSIS — M545 Low back pain: Secondary | ICD-10-CM | POA: Diagnosis not present

## 2018-01-11 MED ORDER — METHYLPREDNISOLONE ACETATE 40 MG/ML IJ SUSP
40.0000 mg | INTRAMUSCULAR | Status: AC | PRN
  Administered 2018-01-11: 40 mg via INTRA_ARTICULAR

## 2018-01-11 MED ORDER — LIDOCAINE HCL 1 % IJ SOLN
2.0000 mL | INTRAMUSCULAR | Status: AC | PRN
  Administered 2018-01-11: 2 mL

## 2018-01-11 MED ORDER — BUPIVACAINE HCL 0.5 % IJ SOLN
2.0000 mL | INTRAMUSCULAR | Status: AC | PRN
  Administered 2018-01-11: 2 mL via INTRA_ARTICULAR

## 2018-01-11 NOTE — Progress Notes (Signed)
Office Visit Note   Patient: Troy [name]           Date of Birth: Dec 20, 1945           MRN: [id number] Visit Date: 01/11/2018              Requested by: Derinda Late, MD [address] [place], [place] [address] PCP: Derinda Late, MD   Assessment & Plan: Visit Diagnoses:  1. Pain in right hip   2. Primary osteoarthritis of left knee     Plan: Impression is left knee osteoarthritis as well as right proximal hamstring tendinitis.  In regards to the left knee, we will inject this with cortisone today.  I have discussed Visco supplementation injection should this fail.  He will let us know.  In regards to the right hamstring, we will send him to formal physical therapy for this.  A prescription was faxed as well as given to the patient.  He will follow-up with [place] as needed.  Call with concerns or questions in the meantime.  Follow-Up Instructions: Return if symptoms worsen or fail to improve.   Orders:  Orders Placed This Encounter  Procedures  . XR HIP UNILAT W OR W/O PELVIS 2-3 VIEWS RIGHT  . XR Lumbar Spine 2-3 Views   No orders of the defined types were placed in this encounter.     Procedures: Large Joint Inj: L knee on 01/11/2018 9:24 AM Details: 22 G needle Medications: 2 mL bupivacaine 0.5 %; 2 mL lidocaine 1 %; 40 mg methylPREDNISolone acetate 40 MG/ML Outcome: tolerated well, no immediate complications Patient was prepped and draped in the usual sterile fashion.       Clinical Data: No additional findings.   Subjective: Chief Complaint  Patient presents with  . Left Knee - Pain  . Right Hip - Pain    HPI patient is a pleasant 72 year old gentleman who presents to our clinic today with continued left knee pain as well as right hip pain.  He does have a history of left knee osteoarthritis worse in the medial compartment.  He was seen in our clinic back in early March of this year where x-rays were obtained.  He declined cortisone injection at that  point.  He is now complaining more of pain to the right buttocks.  Worse lying down at night, but this does occur when he coughs or makes the wrong move.  No pain going down the back of the leg and no numbness, tingling or burning to the right lower extremity.  No groin or anterior thigh pain.  No lateral hip pain.  Review of Systems as detailed in HPI.  All others reviewed and are negative.   Objective: Vital Signs: There were no vitals taken for this visit.  Physical Exam well-developed well-nourished gentleman no acute distress.  Alert and oriented x3.  Ortho Exam stable left knee exam.  Examination lumbar spine reveals tenderness to deep palpation at the ischial tuberosity over the proximal hamstring greater than the sciatic notch on the right.   no spinous or paraspinous tenderness.  Negative straight leg raise.  No pain with flexion or extension of the lumbar spine.  No pain with logroll, although he does have somewhat limited motion.  He is neurovascularly intact distally.  Specialty Comments:  No specialty comments available.  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 Status post laparoscopic Nissen fundoplication and repair of large hiatus hernia June 2016 03/10/2015  . Ventral hernia-s/p repair x 2 09/07/2011  . History of prostate cancer-s/p prostatectomy 09/07/2011   Past Medical History:  Diagnosis Date  . Abdominal distention    hernia  . Anemia   . Arthritis    big toes   . Cancer [facility])    prostate   . GERD (gastroesophageal reflux disease)   . Hypertension   . Lumbar herniated disc    L5  . Rash    yeast infection from bladder sling surgery   . Sciatica of right side 09-01-11   right leg-tx. Tylenol    Family  History  Problem Relation Age of Onset  . Cancer Mother 80       lung     Past Surgical History:  Procedure Laterality Date  . HERNIA REPAIR  September 01, 2010  . INCONTINENCE SURGERY  Jan 31, 2011  . INSERTION OF MESH  03/10/2015   Procedure: INSERTION OF MESH;  Surgeon: Johnathan Hausen, MD;  Location: [facility];  Service: General;;  . LAPAROSCOPIC NISSEN FUNDOPLICATION N/A 4/31/5400   Procedure: LAPAROSCOPIC NISSEN FUNDOPLICATION AND HIATAL HERNIA REPAIR;  Surgeon: Johnathan Hausen, MD;  Location: [facility];  Service: General;  Laterality: N/A;  . PROSTATECTOMY  reb 9, 2010  . TONSILLECTOMY    . UPPER GI ENDOSCOPY N/A 03/10/2015   Procedure: UPPER GI ENDOSCOPY;  Surgeon: Johnathan Hausen, MD;  Location: [facility];  Service: General;  Laterality: N/A;  . VENTRAL HERNIA REPAIR  09/06/2011   Procedure: HERNIA REPAIR VENTRAL ADULT;  Surgeon: Pedro Earls, MD;  [facility];  Service: General;  Laterality: N/A;  . VENTRAL HERNIA REPAIR  09/06/2011   Procedure: LAPAROSCOPIC VENTRAL HERNIA;  Surgeon: Pedro Earls, MD;  [facility];  Service: General;  Laterality: N/A;   Social History   Occupational History  . Not on file  Tobacco Use  . Smoking status: Never Smoker  . Smokeless tobacco: Never Used  Substance and Sexual Activity  . Alcohol use: Yes    Comment: occ. alcohol  . Drug use: No  . Sexual activity: Yes

## 2018-01-17 ENCOUNTER — Telehealth (INDEPENDENT_AMBULATORY_CARE_PROVIDER_SITE_OTHER): Payer: Self-pay | Admitting: Orthopaedic Surgery

## 2018-01-17 NOTE — Telephone Encounter (Signed)
Patient walked in, Dr. Erlinda Hong had said he was going to give him a muscle relaxer and pain med at his last appt but never called it in. Can you check and see if he can send something to [facility]? Patients # [phone]

## 2018-01-17 NOTE — Telephone Encounter (Signed)
Please advise. You last saw her.

## 2018-01-18 ENCOUNTER — Telehealth (INDEPENDENT_AMBULATORY_CARE_PROVIDER_SITE_OTHER): Payer: Self-pay | Admitting: Physician Assistant

## 2018-01-18 ENCOUNTER — Other Ambulatory Visit (INDEPENDENT_AMBULATORY_CARE_PROVIDER_SITE_OTHER): Payer: Self-pay | Admitting: Physician Assistant

## 2018-01-18 MED ORDER — TIZANIDINE HCL 2 MG PO CAPS: 2.0000 mg | ORAL_CAPSULE | Freq: Two times a day (BID) | ORAL | 0 refills | Status: DC | PRN

## 2018-01-18 NOTE — Telephone Encounter (Signed)
Called Rx into pharm

## 2018-01-18 NOTE — Telephone Encounter (Signed)
I just send in muscle relaxer.  Will you call intramadol 50mg  1-2 tabs po q6-8 hours prn pain #30 no refill;s

## 2018-01-18 NOTE — Telephone Encounter (Signed)
Called [facility] back

## 2018-01-18 NOTE — Telephone Encounter (Signed)
Lance-pharmacist with [facility] called needing a call back concerning the Rx Tizanidine. The number to contact [facility] is [phone]

## 2018-04-05 ENCOUNTER — Other Ambulatory Visit: Payer: Self-pay | Admitting: Family Medicine

## 2018-04-05 DIAGNOSIS — M5416 Radiculopathy, lumbar region: Secondary | ICD-10-CM

## 2018-04-10 ENCOUNTER — Ambulatory Visit
Admission: RE | Admit: 2018-04-10 | Discharge: 2018-04-10 | Disposition: A | Discharge status: Home / Self Care | Payer: Medicare Other | Source: Ambulatory Visit | Attending: Family Medicine | Admitting: Family Medicine

## 2018-04-10 DIAGNOSIS — M5416 Radiculopathy, lumbar region: Secondary | ICD-10-CM

## 2018-04-15 ENCOUNTER — Other Ambulatory Visit: Payer: Self-pay | Admitting: Family Medicine

## 2018-04-15 DIAGNOSIS — M5441 Lumbago with sciatica, right side: Secondary | ICD-10-CM

## 2018-04-15 DIAGNOSIS — M5442 Lumbago with sciatica, left side: Principal | ICD-10-CM

## 2018-04-16 ENCOUNTER — Ambulatory Visit
Admission: RE | Admit: 2018-04-16 | Discharge: 2018-04-16 | Disposition: A | Discharge status: Home / Self Care | Payer: Medicare Other | Source: Ambulatory Visit | Attending: Family Medicine | Admitting: Family Medicine

## 2018-04-16 DIAGNOSIS — M5441 Lumbago with sciatica, right side: Secondary | ICD-10-CM

## 2018-04-16 DIAGNOSIS — M5442 Lumbago with sciatica, left side: Principal | ICD-10-CM

## 2018-04-16 MED ORDER — IOPAMIDOL (ISOVUE-M 200) INJECTION 41%
1.0000 mL | Freq: Once | INTRAMUSCULAR | Status: AC
  Administered 2018-04-16: 1 mL via EPIDURAL

## 2018-04-16 MED ORDER — METHYLPREDNISOLONE ACETATE 40 MG/ML INJ SUSP (RADIOLOG
120.0000 mg | Freq: Once | INTRAMUSCULAR | Status: AC
  Administered 2018-04-16: 120 mg via EPIDURAL

## 2018-04-16 NOTE — Discharge Instructions (Signed)

## 2018-04-24 ENCOUNTER — Other Ambulatory Visit: Payer: Self-pay | Admitting: Family Medicine

## 2018-04-24 DIAGNOSIS — M5432 Sciatica, left side: Principal | ICD-10-CM

## 2018-04-24 DIAGNOSIS — M5431 Sciatica, right side: Secondary | ICD-10-CM

## 2018-05-02 ENCOUNTER — Other Ambulatory Visit: Payer: Self-pay | Admitting: Family Medicine

## 2018-05-02 DIAGNOSIS — R509 Fever, unspecified: Secondary | ICD-10-CM

## 2018-05-02 DIAGNOSIS — R1030 Lower abdominal pain, unspecified: Secondary | ICD-10-CM

## 2018-05-03 ENCOUNTER — Other Ambulatory Visit: Payer: Self-pay | Admitting: Family Medicine

## 2018-05-03 ENCOUNTER — Encounter (HOSPITAL_COMMUNITY): Payer: Self-pay

## 2018-05-03 ENCOUNTER — Ambulatory Visit
Admission: RE | Admit: 2018-05-03 | Discharge: 2018-05-03 | Disposition: A | Discharge status: Home / Self Care | Payer: Medicare Other | Source: Ambulatory Visit | Attending: Family Medicine | Admitting: Family Medicine

## 2018-05-03 ENCOUNTER — Other Ambulatory Visit: Payer: Self-pay

## 2018-05-03 ENCOUNTER — Inpatient Hospital Stay (HOSPITAL_COMMUNITY)
Admission: EM | Admit: 2018-05-03 | Discharge: 2018-05-06 | DRG: 392 | Disposition: A | Discharge status: Home / Self Care | Payer: Medicare Other | Attending: General Surgery | Admitting: General Surgery

## 2018-05-03 DIAGNOSIS — K5720 Diverticulitis of large intestine with perforation and abscess without bleeding: Principal | ICD-10-CM | POA: Diagnosis present

## 2018-05-03 DIAGNOSIS — N289 Disorder of kidney and ureter, unspecified: Secondary | ICD-10-CM | POA: Diagnosis present

## 2018-05-03 DIAGNOSIS — R109 Unspecified abdominal pain: Secondary | ICD-10-CM

## 2018-05-03 DIAGNOSIS — I10 Essential (primary) hypertension: Secondary | ICD-10-CM | POA: Diagnosis present

## 2018-05-03 DIAGNOSIS — R509 Fever, unspecified: Secondary | ICD-10-CM

## 2018-05-03 DIAGNOSIS — E860 Dehydration: Secondary | ICD-10-CM | POA: Diagnosis present

## 2018-05-03 DIAGNOSIS — M1990 Unspecified osteoarthritis, unspecified site: Secondary | ICD-10-CM | POA: Diagnosis present

## 2018-05-03 DIAGNOSIS — R1030 Lower abdominal pain, unspecified: Secondary | ICD-10-CM

## 2018-05-03 DIAGNOSIS — Z888 Allergy status to other drugs, medicaments and biological substances status: Secondary | ICD-10-CM

## 2018-05-03 DIAGNOSIS — K5792 Diverticulitis of intestine, part unspecified, without perforation or abscess without bleeding: Secondary | ICD-10-CM | POA: Diagnosis present

## 2018-05-03 DIAGNOSIS — K219 Gastro-esophageal reflux disease without esophagitis: Secondary | ICD-10-CM | POA: Diagnosis present

## 2018-05-03 DIAGNOSIS — Z8546 Personal history of malignant neoplasm of prostate: Secondary | ICD-10-CM | POA: Diagnosis not present

## 2018-05-03 DIAGNOSIS — Z79899 Other long term (current) drug therapy: Secondary | ICD-10-CM

## 2018-05-03 LAB — COMPREHENSIVE METABOLIC PANEL
ALBUMIN: 2.9 g/dL — AB (ref 3.5–5.0)
ALT: 28 U/L (ref 0–44)
AST: 18 U/L (ref 15–41)
Alkaline Phosphatase: 59 U/L (ref 38–126)
Anion gap: 13 (ref 5–15)
BUN: 44 mg/dL — ABNORMAL HIGH (ref 8–23)
CHLORIDE: 99 mmol/L (ref 98–111)
CO2: 24 mmol/L (ref 22–32)
Calcium: 9 mg/dL (ref 8.9–10.3)
Creatinine, Ser: 2.06 mg/dL — ABNORMAL HIGH (ref 0.61–1.24)
GFR calc Af Amer: 35 mL/min — ABNORMAL LOW (ref >60)
GFR calc non Af Amer: 31 mL/min — ABNORMAL LOW (ref >60)
GLUCOSE: 120 mg/dL — AB (ref 70–99)
POTASSIUM: 4 mmol/L (ref 3.5–5.1)
Sodium: 136 mmol/L (ref 135–145)
Total Bilirubin: 1 mg/dL (ref 0.3–1.2)
Total Protein: 6.5 g/dL (ref 6.5–8.1)

## 2018-05-03 LAB — URINALYSIS, ROUTINE W REFLEX MICROSCOPIC
BACTERIA UA: NONE SEEN
BILIRUBIN URINE: NEGATIVE
Glucose, UA: NEGATIVE mg/dL
Ketones, ur: NEGATIVE mg/dL
Leukocytes, UA: NEGATIVE
Nitrite: NEGATIVE
Protein, ur: NEGATIVE mg/dL
SPECIFIC GRAVITY, URINE: 1.023 (ref 1.005–1.030)
pH: 5 (ref 5.0–8.0)

## 2018-05-03 LAB — CBC WITH DIFFERENTIAL/PLATELET
ABS IMMATURE GRANULOCYTES: 0.1 10*3/uL (ref 0.0–0.1)
BASOS ABS: 0 10*3/uL (ref 0.0–0.1)
Basophils Relative: 0 %
EOS PCT: 1 %
Eosinophils Absolute: 0.1 10*3/uL (ref 0.0–0.7)
HEMATOCRIT: 44.8 % (ref 39.0–52.0)
HEMOGLOBIN: 14.5 g/dL (ref 13.0–17.0)
Immature Granulocytes: 0 %
LYMPHS ABS: 0.8 10*3/uL (ref 0.7–4.0)
Lymphocytes Relative: 7 %
MCH: 31 pg (ref 26.0–34.0)
MCHC: 32.4 g/dL (ref 30.0–36.0)
MCV: 95.9 fL (ref 78.0–100.0)
MONO ABS: 0.9 10*3/uL (ref 0.1–1.0)
Monocytes Relative: 7 %
NEUTROS ABS: 9.9 10*3/uL — AB (ref 1.7–7.7)
Neutrophils Relative %: 85 %
Platelets: 318 10*3/uL (ref 150–400)
RBC: 4.67 MIL/uL (ref 4.22–5.81)
RDW: 12.7 % (ref 11.5–15.5)
WBC: 11.7 10*3/uL — ABNORMAL HIGH (ref 4.0–10.5)

## 2018-05-03 LAB — I-STAT CG4 LACTIC ACID, ED: Lactic Acid, Venous: 1.5 mmol/L (ref 0.5–1.9)

## 2018-05-03 LAB — PROTIME-INR
INR: 1.17
Prothrombin Time: 14.8 seconds (ref 11.4–15.2)

## 2018-05-03 MED ORDER — SIMETHICONE 80 MG PO CHEW: 40.0000 mg | CHEWABLE_TABLET | Freq: Four times a day (QID) | ORAL | Status: DC | PRN

## 2018-05-03 MED ORDER — TRAMADOL HCL 50 MG PO TABS: 50.0000 mg | ORAL_TABLET | Freq: Four times a day (QID) | ORAL | Status: DC | PRN

## 2018-05-03 MED ORDER — LOSARTAN POTASSIUM 50 MG PO TABS
100.0000 mg | ORAL_TABLET | Freq: Every morning | ORAL | Status: DC
  Administered 2018-05-04 – 2018-05-06 (×3): 100 mg via ORAL
  Filled 2018-05-03 (×3): qty 2

## 2018-05-03 MED ORDER — MORPHINE SULFATE (PF) 2 MG/ML IV SOLN: 1.0000 mg | INTRAVENOUS | Status: DC | PRN

## 2018-05-03 MED ORDER — IOPAMIDOL (ISOVUE-300) INJECTION 61%
100.0000 mL | Freq: Once | INTRAVENOUS | Status: AC | PRN
  Administered 2018-05-03: 100 mL via INTRAVENOUS

## 2018-05-03 MED ORDER — ONDANSETRON 4 MG PO TBDP: 4.0000 mg | ORAL_TABLET | Freq: Four times a day (QID) | ORAL | Status: DC | PRN

## 2018-05-03 MED ORDER — KCL IN DEXTROSE-NACL 20-5-0.45 MEQ/L-%-% IV SOLN
INTRAVENOUS | Status: DC
  Administered 2018-05-03 – 2018-05-06 (×7): via INTRAVENOUS
  Filled 2018-05-03 (×7): qty 1000

## 2018-05-03 MED ORDER — AMLODIPINE BESYLATE 5 MG PO TABS
5.0000 mg | ORAL_TABLET | Freq: Every morning | ORAL | Status: DC
  Administered 2018-05-04 – 2018-05-06 (×3): 5 mg via ORAL
  Filled 2018-05-03: qty 2
  Filled 2018-05-03 (×3): qty 1

## 2018-05-03 MED ORDER — DIPHENHYDRAMINE HCL 50 MG/ML IJ SOLN: 12.5000 mg | Freq: Four times a day (QID) | INTRAMUSCULAR | Status: DC | PRN

## 2018-05-03 MED ORDER — EZETIMIBE 10 MG PO TABS
10.0000 mg | ORAL_TABLET | Freq: Every day | ORAL | Status: DC
  Administered 2018-05-03 – 2018-05-05 (×3): 10 mg via ORAL
  Filled 2018-05-03 (×3): qty 1

## 2018-05-03 MED ORDER — SODIUM CHLORIDE 0.9 % IV BOLUS (SEPSIS)
1000.0000 mL | Freq: Once | INTRAVENOUS | Status: AC
  Administered 2018-05-03: 1000 mL via INTRAVENOUS

## 2018-05-03 MED ORDER — ACETAMINOPHEN 500 MG PO TABS
1000.0000 mg | ORAL_TABLET | Freq: Four times a day (QID) | ORAL | Status: DC | PRN
  Administered 2018-05-04: 1000 mg via ORAL
  Filled 2018-05-03: qty 2

## 2018-05-03 MED ORDER — ALPRAZOLAM 0.5 MG PO TABS: 0.5000 mg | ORAL_TABLET | Freq: Every evening | ORAL | Status: DC | PRN

## 2018-05-03 MED ORDER — POLYETHYLENE GLYCOL 3350 17 G PO PACK: 17.0000 g | PACK | Freq: Every day | ORAL | Status: DC | PRN

## 2018-05-03 MED ORDER — ENOXAPARIN SODIUM 40 MG/0.4ML ~~LOC~~ SOLN
40.0000 mg | SUBCUTANEOUS | Status: DC
  Administered 2018-05-04 – 2018-05-05 (×2): 40 mg via SUBCUTANEOUS
  Filled 2018-05-03 (×2): qty 0.4

## 2018-05-03 MED ORDER — SODIUM CHLORIDE 0.9 % IV SOLN: 2.0000 g | INTRAVENOUS | Status: DC

## 2018-05-03 MED ORDER — PIPERACILLIN-TAZOBACTAM 3.375 G IVPB
3.3750 g | Freq: Three times a day (TID) | INTRAVENOUS | Status: DC
  Administered 2018-05-03 – 2018-05-05 (×6): 3.375 g via INTRAVENOUS
  Filled 2018-05-03 (×6): qty 50

## 2018-05-03 MED ORDER — METRONIDAZOLE IN NACL 5-0.79 MG/ML-% IV SOLN
500.0000 mg | Freq: Three times a day (TID) | INTRAVENOUS | Status: DC
  Administered 2018-05-03: 500 mg via INTRAVENOUS
  Filled 2018-05-03 (×2): qty 100

## 2018-05-03 MED ORDER — HYDROCHLOROTHIAZIDE 12.5 MG PO CAPS
12.5000 mg | ORAL_CAPSULE | Freq: Every morning | ORAL | Status: DC
  Administered 2018-05-04 – 2018-05-06 (×3): 12.5 mg via ORAL
  Filled 2018-05-03 (×3): qty 1

## 2018-05-03 MED ORDER — ONDANSETRON HCL 4 MG/2ML IJ SOLN: 4.0000 mg | Freq: Four times a day (QID) | INTRAMUSCULAR | Status: DC | PRN

## 2018-05-03 MED ORDER — SODIUM CHLORIDE 0.9 % IV SOLN
2.0000 g | Freq: Once | INTRAVENOUS | Status: AC
  Administered 2018-05-03: 2 g via INTRAVENOUS
  Filled 2018-05-03: qty 2

## 2018-05-03 MED ORDER — ZOLPIDEM TARTRATE 5 MG PO TABS
5.0000 mg | ORAL_TABLET | Freq: Every evening | ORAL | Status: DC | PRN
  Administered 2018-05-03 – 2018-05-05 (×3): 5 mg via ORAL
  Filled 2018-05-03 (×3): qty 1

## 2018-05-03 MED ORDER — ROSUVASTATIN CALCIUM 20 MG PO TABS
20.0000 mg | ORAL_TABLET | Freq: Every day | ORAL | Status: DC
  Administered 2018-05-03 – 2018-05-05 (×3): 20 mg via ORAL
  Filled 2018-05-03 (×3): qty 1

## 2018-05-03 MED ORDER — DIPHENHYDRAMINE HCL 12.5 MG/5ML PO ELIX: 12.5000 mg | ORAL_SOLUTION | Freq: Four times a day (QID) | ORAL | Status: DC | PRN

## 2018-05-03 MED ORDER — METOPROLOL SUCCINATE ER 50 MG PO TB24
50.0000 mg | ORAL_TABLET | Freq: Every day | ORAL | Status: DC
  Administered 2018-05-04 – 2018-05-06 (×3): 50 mg via ORAL
  Filled 2018-05-03 (×3): qty 1

## 2018-05-03 NOTE — H&P (Signed)
Troy [name] 06-Sep-1946  [id number].    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  He has undergone a robotic prostatectomy and has had no recurrence of his cancer.  He then had an incisional hernia and incontinence and required two lap hernia repairs with mesh and bladder sling to be placed.  He has also had a lap Nissen fundoplication as well.  He admits to one episode of diverticulitis that was treated as an outpatient about 10 years ago.  He sees Dr. Cristina Gong and had a colonoscopy within the last several years he thinks.    About a week ago he began having crampy lower abdominal pain with pressure.  It was fairly significant at the time but has actually improved over the last week.  He did run a low grade fever of 100 a couple of days ago.  He denies any N/V.  He has had looser stools over the last several days, but not watery and no blood present.  He saw his PCP 2 days ago who sent him for a CT scan which just happened to be today.  He was found to have diverticulitis with a contained perforation with multiple small abscesses.  He was referred to [facility] for further evaluation.  Here his WBC is 11.  He is AF and denies any abdominal pain.  ROS: ROS: Please see HPI, otherwise all other systems are negative.  Family History  Problem Relation Age of Onset  . Cancer Mother 71       lung     Past Medical History:  Diagnosis Date  . Abdominal distention    hernia  . Anemia   . Arthritis    big toes   . Cancer [facility])    prostate   . GERD (gastroesophageal reflux disease)   . Hypertension   . Lumbar herniated disc    L5  . Rash    yeast infection from bladder sling surgery   . Sciatica of right side 09-01-11   right leg-tx. Tylenol    Past Surgical History:  Procedure Laterality Date  . HERNIA REPAIR  September 01, 2010  . INCONTINENCE SURGERY  Jan 31, 2011    . INSERTION OF MESH  03/10/2015   Procedure: INSERTION OF MESH;  Surgeon: Johnathan Hausen, MD;  [facility];  Service: General;;  . LAPAROSCOPIC NISSEN FUNDOPLICATION N/A 6/72/0947   Procedure: LAPAROSCOPIC NISSEN FUNDOPLICATION AND HIATAL HERNIA REPAIR;  Surgeon: Johnathan Hausen, MD;  [facility];  Service: General;  Laterality: N/A;  . PROSTATECTOMY  reb 9, 2010  . TONSILLECTOMY    . UPPER GI ENDOSCOPY N/A 03/10/2015   Procedure: UPPER GI ENDOSCOPY;  Surgeon: Johnathan Hausen, MD;  [facility];  Service: General;  Laterality: N/A;  . VENTRAL HERNIA REPAIR  09/06/2011   Procedure: HERNIA REPAIR VENTRAL ADULT;  Surgeon: Pedro Earls, MD;  [facility];  Service: General;  Laterality: N/A;  . VENTRAL HERNIA REPAIR  09/06/2011   Procedure: LAPAROSCOPIC VENTRAL HERNIA;  Surgeon: Pedro Earls, MD;  [facility];  Service: General;  Laterality: N/A;    Social History:  reports that he has never smoked. He has never used smokeless tobacco. He reports that he drinks alcohol. He reports that he does not use drugs.  Allergies:  Allergies  Allergen Reactions  . Ciprofloxacin Diarrhea  and Nausea And Vomiting    Can take Levaquin     (Not in a hospital admission)   Physical Exam: Blood pressure 107/77, pulse 74, temperature 97.6 F (36.4 C), temperature source Oral, resp. rate (!) 21, SpO2 97 %. General: pleasant, WD, WN, obese white male who is laying in bed in NAD HEENT: head is normocephalic, atraumatic.  Sclera are noninjected.  PERRL.  Ears and nose without any masses or lesions.  Mouth is pink and moist Heart: regular, rate, and rhythm.  Normal s1,s2. No obvious murmurs, gallops, or rubs noted.  Palpable radial and pedal pulses bilaterally Lungs: CTAB, no wheezes, rhonchi, or rales noted.  Respiratory effort nonlabored Abd: soft, NT, ND, +BS, no masses, hernias, or organomegaly MS: all 4 extremities are symmetrical with no cyanosis, clubbing, or edema. Skin:  warm and dry with no masses, lesions, or rashes Psych: A&Ox3 with an appropriate affect.   Results for orders placed or performed during the hospital encounter of 05/03/18 (from the past 48 hour(s))  Comprehensive metabolic panel     Status: Abnormal   Collection Time: 05/03/18  9:57 AM  Result Value Ref Range   Sodium 136 135 - 145 mmol/L   Potassium 4.0 3.5 - 5.1 mmol/L   Chloride 99 98 - 111 mmol/L   CO2 24 22 - 32 mmol/L   Glucose, Bld 120 (H) 70 - 99 mg/dL   BUN 44 (H) 8 - 23 mg/dL   Creatinine, Ser 2.06 (H) 0.61 - 1.24 mg/dL   Calcium 9.0 8.9 - 10.3 mg/dL   Total Protein 6.5 6.5 - 8.1 g/dL   Albumin 2.9 (L) 3.5 - 5.0 g/dL   AST 18 15 - 41 U/L   ALT 28 0 - 44 U/L   Alkaline Phosphatase 59 38 - 126 U/L   Total Bilirubin 1.0 0.3 - 1.2 mg/dL   GFR calc non Af Amer 31 (L) >60 mL/min   GFR calc Af Amer 35 (L) >60 mL/min    Comment: (NOTE) The eGFR has been calculated using the CKD EPI equation. This calculation has not been validated in all clinical situations. eGFR's persistently <60 mL/min signify possible Chronic Kidney Disease.    Anion gap 13 5 - 15    Comment: Performed at [facility] [address] [place],  [address]  CBC with Differential     Status: Abnormal   Collection Time: 05/03/18  9:57 AM  Result Value Ref Range   WBC 11.7 (H) 4.0 - 10.5 K/uL   RBC 4.67 4.22 - 5.81 MIL/uL   Hemoglobin 14.5 13.0 - 17.0 g/dL   HCT 44.8 39.0 - 52.0 %   MCV 95.9 78.0 - 100.0 fL   MCH 31.0 26.0 - 34.0 pg   MCHC 32.4 30.0 - 36.0 g/dL   RDW 12.7 11.5 - 15.5 %   Platelets 318 150 - 400 K/uL   Neutrophils Relative % 85 %   Neutro Abs 9.9 (H) 1.7 - 7.7 K/uL   Lymphocytes Relative 7 %   Lymphs Abs 0.8 0.7 - 4.0 K/uL   Monocytes Relative 7 %   Monocytes Absolute 0.9 0.1 - 1.0 K/uL   Eosinophils Relative 1 %   Eosinophils Absolute 0.1 0.0 - 0.7 K/uL   Basophils Relative 0 %   Basophils Absolute 0.0 0.0 - 0.1 K/uL   Immature Granulocytes 0 %   Abs Immature  Granulocytes 0.1 0.0 - 0.1 K/uL    Comment: Performed at [facility],  [address], [place], [place] [address]  Urinalysis, Routine w reflex microscopic     Status: Abnormal   Collection Time: 05/03/18  9:57 AM  Result Value Ref Range   Color, Urine STRAW (A) YELLOW   APPearance CLEAR CLEAR   Specific Gravity, Urine 1.023 1.005 - 1.030   pH 5.0 5.0 - 8.0   Glucose, UA NEGATIVE NEGATIVE mg/dL   Hgb urine dipstick SMALL (A) NEGATIVE   Bilirubin Urine NEGATIVE NEGATIVE   Ketones, ur NEGATIVE NEGATIVE mg/dL   Protein, ur NEGATIVE NEGATIVE mg/dL   Nitrite NEGATIVE NEGATIVE   Leukocytes, UA NEGATIVE NEGATIVE   RBC / HPF 0-5 0 - 5 RBC/hpf   WBC, UA 0-5 0 - 5 WBC/hpf   Bacteria, UA NONE SEEN NONE SEEN    Comment: Performed at [facility] [address], [place], [place] [address]  I-Stat CG4 Lactic Acid, ED     Status: None   Collection Time: 05/03/18 10:09 AM  Result Value Ref Range   Lactic Acid, Venous 1.50 0.5 - 1.9 mmol/L   Dg Chest 2 View  Result Date: 05/03/2018 CLINICAL DATA:  Fevers EXAM: CHEST - 2 VIEW COMPARISON:  10/03/2013 FINDINGS: Cardiac shadows within normal limits. The lungs are well aerated bilaterally. Mild degenerative changes of the thoracic spine are noted. IMPRESSION: No active cardiopulmonary disease. Electronically Signed   By: Inez Catalina M.D.   On: 05/03/2018 09:07   Ct Abdomen Pelvis W Contrast  Result Date: 05/03/2018 CLINICAL DATA:  72 year old male with history of left lower quadrant abdominal pain 1 week ago starting antibiotics yesterday. Fever. History of prostate cancer status post prostatectomy. Prior history of hernia repairs. EXAM: CT ABDOMEN AND PELVIS WITH CONTRAST TECHNIQUE: Multidetector CT imaging of the abdomen and pelvis was performed using the standard protocol following bolus administration of intravenous contrast. CONTRAST:  19m ISOVUE-300 IOPAMIDOL (ISOVUE-300) INJECTION 61% COMPARISON:  CT the abdomen and pelvis 10/17/2013.  FINDINGS: Lower chest: 2 mm subpleural pulmonary nodule in the right lower lobe, unchanged compared to the prior examination from 2015, considered definitively benign (presumably a subpleural lymph node). Hepatobiliary: No suspicious cystic or solid hepatic lesions. No intra or extrahepatic biliary ductal dilatation. Tiny calcified gallstones lying dependently in the gallbladder. No findings to suggest an acute cholecystitis at this time. Pancreas: No pancreatic mass. No pancreatic ductal dilatation. No pancreatic or peripancreatic fluid or inflammatory changes. Spleen: Unremarkable. Adrenals/Urinary Tract: 2.4 cm exophytic low-attenuation nonenhancing lesion in the upper pole of the left kidney, compatible with a simple cyst. Subcentimeter low-attenuation lesion in the upper pole the left kidney, too small to definitively characterize, but statistically also likely a tiny cyst. Right kidney and bilateral adrenal glands are normal in appearance. No hydroureteronephrosis. Urinary bladder is unremarkable in appearance. Stomach/Bowel: Surgical clips near the gastroesophageal junction. Stomach is otherwise normal in appearance. No pathologic dilatation of small bowel or colon. There is extensive colonic diverticulosis, most severe in the descending colon and sigmoid colon. In the mid sigmoid colon there are extensive surrounding inflammatory changes, indicative of an acute diverticulitis. In this region, there is at least one extraluminal fluid and gas collection, best appreciated on axial image 70 of series 2 and coronal image 57 of series 4, where this measures 2.8 x 4.7 x 2.5 cm. Immediately inferior to this there is a smaller extraluminal collection (axial image 73 of series 2 and coronal image 66 of series 4) measuring 1.7 x 2.4 x 2.2 cm which appears to contain some feculent  material. Surrounding these two collections are extensive inflammatory changes in the associated mesocolon and adjacent small mild mesentery,  as well as a small volume of fluid. Some of this fluid demonstrates rim enhancement (axial image 67 of series 2), which could simply reflect underlying peritonitis, or could indicate additional developing abscess(es). The appendix is not confidently identified. Vascular/Lymphatic: Aortic atherosclerosis, with mild aneurysmal dilatation of the right common iliac artery which measures up to 17 mm in diameter. No lymphadenopathy identified in the abdomen or pelvis. Reproductive: Status post radical prostatectomy. Other: Trace amount of free fluid in the low anatomic pelvis with enhancement of peritoneal membranes, indicative of peritonitis. Given the presence of extraluminal fluid and gas throughout the low anatomic pelvis, findings are concerning for perforated diverticulitis. Small right inguinal hernia containing only fat. Musculoskeletal: There are no aggressive appearing lytic or blastic lesions noted in the visualized portions of the skeleton. IMPRESSION: 1. Severe acute diverticulitis of the sigmoid colon, with evidence of perforation including what appear to be several small abscesses in the low anatomic pelvis as well as extensive peritonitis. Emergent surgical consultation is strongly recommended. 2. Cholelithiasis without evidence of acute cholecystitis at this time. 3. 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 4. Additional incidental findings, as above. Critical Value/emergent results were called by telephone at the time of interpretation on 05/03/2018 at 9:07 a.m. to Dr. Derinda Late, who verbally acknowledged these results. Per his direction, the patient was instructed to go to the [facility] Emergency Department as soon as possible. Electronically Signed   By: Vinnie Langton M.D.   On: 05/03/2018 09:08      Assessment/Plan Diverticulitis with contained perforation and multiple abscesses in the pelvis The  patient is currently asymptomatic despite his CT showing significant diverticulitis with contained microperforation and multiple pelvic abscesses.  He will be admitted for IV abx therapy, and IR has reviewed his imaging and said he is a candidate for a percutaneous drain to be placed in the largest collection.  He will have this done in the morning.  He will be NPO for now x ice chips and after drain placement tomorrow, can likely have his diet advanced.  Even though he is very stable with no pain, we did discuss that if he worsens, he may need a colectomy/colostomy.  he understands this.    HTN - resume home meds  Acute renal insufficiency - Cr 2.06.  Likely secondary to dehydration.  Check BMET in the am.  FEN - IVFs/NPO x ice VTE - SCDs/Lovenox to start tomorrow after drain placement ID - Zosyn 8/23 --[place], [facility] Surgery 05/03/2018, 12:54 PM Pager: [phone]

## 2018-05-03 NOTE — Consult Note (Signed)
Chief Complaint: Patient was seen in consultation today for intra-abdominal fluid collections  Referring Physician(s): Dr. Hulen Skains  Supervising Physician: Marybelle Killings  Patient Status: [facility] - In-pt  History of Present Illness: Troy [name] is a 72 y.o. male with past medical history of GERD, HTN, prostate cancer, and diverticulitis who presented to his PCP Wednesday with abdominal pain. He was sent for a CT Abdomen/Pelvis 05/03/18 which showed severe acute diverticulitis of the sigmoid colon, with evidence of perforation including what appear to be several small abscesses in the low anatomic pelvis as well as extensive peritonitis.  IR consulted for aspiration and drainage of intra-abdominal fluid collections at the request of Dr. Hulen Skains.  Patient has been admitted to [facility].  Case reviewed by Dr. Barbie Banner who approves patient for procedure.  Past Medical History:  Diagnosis Date  . Abdominal distention    hernia  . Anemia   . Arthritis    big toes   . Cancer [facility])    prostate   . GERD (gastroesophageal reflux disease)   . Hypertension   . Lumbar herniated disc    L5  . Rash    yeast infection from bladder sling surgery   . Sciatica of right side 09-01-11   right leg-tx. Tylenol    Past Surgical History:  Procedure Laterality Date  . HERNIA REPAIR  September 01, 2010  . INCONTINENCE SURGERY  Jan 31, 2011  . INSERTION OF MESH  03/10/2015   Procedure: INSERTION OF MESH;  Surgeon: Johnathan Hausen, MD;  Location: [facility];  Service: General;;  . LAPAROSCOPIC NISSEN FUNDOPLICATION N/A 3/84/5364   Procedure: LAPAROSCOPIC NISSEN FUNDOPLICATION AND HIATAL HERNIA REPAIR;  Surgeon: Johnathan Hausen, MD;  Location: [facility];  Service: General;  Laterality: N/A;  . PROSTATECTOMY  reb 9, 2010  . TONSILLECTOMY    . UPPER GI ENDOSCOPY N/A 03/10/2015   Procedure: UPPER GI ENDOSCOPY;  Surgeon: Johnathan Hausen, MD;  Location: [facility];  Service: General;  Laterality: N/A;  . VENTRAL HERNIA REPAIR   09/06/2011   Procedure: HERNIA REPAIR VENTRAL ADULT;  Surgeon: Pedro Earls, MD;  Location: [facility];  Service: General;  Laterality: N/A;  . VENTRAL HERNIA REPAIR  09/06/2011   Procedure: LAPAROSCOPIC VENTRAL HERNIA;  Surgeon: Pedro Earls, MD;  Location: [facility];  Service: General;  Laterality: N/A;    Allergies: Ciprofloxacin  Medications: Prior to Admission medications   Medication Sig Start Date End Date Taking? Authorizing Provider  ALPRAZolam Duanne Moron) 0.5 MG tablet Take 0.5 mg by mouth at bedtime as needed for anxiety. For sleep.   Yes [provider]  amLODipine (NORVASC) 5 MG tablet Take 5 mg by mouth every morning.   Yes [provider]  Cholecalciferol (VITAMIN D3) 2000 UNITS TABS Take 1 tablet by mouth every morning.   Yes [provider]  ezetimibe (ZETIA) 10 MG tablet Take 10 mg by mouth at bedtime.   Yes [provider]  hydrochlorothiazide (MICROZIDE) 12.5 MG capsule Take 12.5 mg by mouth every morning.   Yes [provider]  Javier Docker Oil 500 MG CAPS Take 500 mg by mouth daily.   Yes [provider]  losartan (COZAAR) 100 MG tablet Take 100 mg by mouth every morning.   Yes [provider]  metoprolol succinate (TOPROL-XL) 50 MG 24 hr tablet Take 50 mg by mouth daily. Take with or immediately following a meal.   Yes [provider]  nitroGLYCERIN (NITRODUR - DOSED IN MG/24 HR) 0.2 mg/hr  patch Place 1 patch (0.2 mg total) onto the skin daily. 11/22/16  Yes Dondra Prader R, NP  rosuvastatin (CRESTOR) 20 MG tablet Take 20 mg by mouth at bedtime.   Yes [provider]  TURMERIC PO Take 1,000 mg by mouth as needed.   Yes [provider]     Family History  Problem Relation Age of Onset  . Cancer Mother 73       lung     Social History   Socioeconomic History  . Marital status: Married    Spouse name: Not on file  . Number of children: Not on file  . Years of education: Not on file    . Highest education level: Not on file  Occupational History  . Not on file  Social Needs  . Financial resource strain: Not on file  . Food insecurity:    Worry: Not on file    Inability: Not on file  . Transportation needs:    Medical: Not on file    Non-medical: Not on file  Tobacco Use  . Smoking status: Never Smoker  . Smokeless tobacco: Never Used  Substance and Sexual Activity  . Alcohol use: Yes    Comment: occ. alcohol  . Drug use: No  . Sexual activity: Yes  Lifestyle  . Physical activity:    Days per week: Not on file    Minutes per session: Not on file  . Stress: Not on file  Relationships  . Social connections:    Talks on phone: Not on file    Gets together: Not on file    Attends religious service: Not on file    Active member of club or organization: Not on file    Attends meetings of clubs or organizations: Not on file    Relationship status: Not on file  Other Topics Concern  . Not on file  Social History Narrative  . Not on file     Review of Systems: A 12 point ROS discussed and pertinent positives are indicated in the HPI above.  All other systems are negative.  Review of Systems  Constitutional: Negative for fatigue and fever.  Respiratory: Negative for cough and shortness of breath.   Cardiovascular: Negative for chest pain.  Gastrointestinal: Positive for abdominal pain and diarrhea. Negative for nausea and vomiting.  Musculoskeletal: Negative for back pain.  Psychiatric/Behavioral: Negative for behavioral problems and confusion.    Vital Signs: BP 137/77 (BP Location: Left Arm)   Pulse 81   Temp 97.8 F (36.6 C) (Oral)   Resp 18   Ht '5\' 9"'  (1.753 m)   Wt 230 lb (104.3 kg)   SpO2 98%   BMI 33.97 kg/m   Physical Exam  Constitutional: He is oriented to person, place, and time. He appears well-developed. No distress.  Cardiovascular: Normal rate, regular rhythm and normal heart sounds.  Pulmonary/Chest: Effort normal and breath  sounds normal. No respiratory distress.  Abdominal: Soft. He exhibits no distension. There is no tenderness.  Neurological: He is alert and oriented to person, place, and time.  Skin: Skin is warm and dry. He is not diaphoretic.  Psychiatric: He has a normal mood and affect. His behavior is normal. Judgment and thought content normal.  Nursing note and vitals reviewed.    MD Evaluation Airway: WNL Heart: WNL Abdomen: WNL Chest/ Lungs: WNL ASA  Classification: 3 Mallampati/Airway Score: Two   Imaging: Dg Chest 2 View  Result Date: 05/03/2018 CLINICAL DATA:  Fevers  EXAM: CHEST - 2 VIEW COMPARISON:  10/03/2013 FINDINGS: Cardiac shadows within normal limits. The lungs are well aerated bilaterally. Mild degenerative changes of the thoracic spine are noted. IMPRESSION: No active cardiopulmonary disease. Electronically Signed   By: Inez Catalina M.D.   On: 05/03/2018 09:07   Mr Lumbar Spine Wo Contrast  Result Date: 04/10/2018 CLINICAL DATA:  Right buttock and posterior leg pain for 4 months. History of prostate cancer. EXAM: MRI LUMBAR SPINE WITHOUT CONTRAST TECHNIQUE: Multiplanar, multisequence MR imaging of the lumbar spine was performed. No intravenous contrast was administered. COMPARISON:  10/04/2011 FINDINGS: Segmentation:  Standard. Alignment:  Normal. Vertebrae: No fracture or suspicious osseous lesion. Mild multilevel degenerative endplate changes in the lower thoracic and lumbar spine. Conus medullaris and cauda equina: Conus extends to the L1 level. Conus and cauda equina appear normal. Paraspinal and other soft tissues: Partially visualized T2 hyperintense renal lesions with the largest measuring 2.3 cm in the left upper pole, likely cysts but incompletely evaluated. Similar appearance of bilateral common iliac artery ectasia. Disc levels: Disc desiccation and mild disc space narrowing throughout the lumbar spine. T11-12: Mild right facet arthrosis without disc herniation or stenosis,  unchanged. T12-L1: Minimal disc bulging and mild facet hypertrophy without stenosis, unchanged. L1-2: Mild disc bulging, increased from prior. New small left foraminal and extraforaminal disc protrusion. Mild bilateral facet hypertrophy. No significant stenosis. L2-3: Mild circumferential disc bulging and moderate facet and ligamentum flavum hypertrophy, stable to minimally progressed. Borderline spinal stenosis. Patent neural foramina. L3-4: Circumferential disc bulging, a new small to moderate-sized central disc extrusion, and moderate facet and ligamentum flavum hypertrophy result in increased, moderate spinal stenosis and mild bilateral neural foraminal stenosis. L4-5: Circumferential disc bulging and moderate facet and ligamentum flavum hypertrophy result in moderate spinal stenosis, mild right and mild-to-moderate left lateral recess stenosis, and minimal bilateral neural foraminal stenosis. The small right subarticular disc extrusion on the prior study has regressed with improved right lateral recess patency. L5-S1: A broad central disc protrusion approaches and may contact the S1 nerve roots (at least on the right) without evidence of neural compression or significant stenosis. The left paracentral component of the disc protrusion is slightly smaller than on the prior study. There is mild facet hypertrophy without neural foraminal stenosis. IMPRESSION: 1. New central disc extrusion at L3-4 with moderate spinal stenosis. 2. 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 Regression of right subarticular disc extrusion with improved right lateral recess patency. 3. New left foraminal/extraforaminal disc protrusion at L1-2 without stenosis. 4. Slightly decreased size of L5-S1 disc protrusion without stenosis. Electronically Signed   By: Logan Bores M.D.   On: 04/10/2018 09:00   Ct Abdomen Pelvis W Contrast  Result Date: 05/03/2018 CLINICAL DATA:  72 year old male with history of left lower quadrant abdominal  pain 1 week ago starting antibiotics yesterday. Fever. History of prostate cancer status post prostatectomy. Prior history of hernia repairs. EXAM: CT ABDOMEN AND PELVIS WITH CONTRAST TECHNIQUE: Multidetector CT imaging of the abdomen and pelvis was performed using the standard protocol following bolus administration of intravenous contrast. CONTRAST:  159m ISOVUE-300 IOPAMIDOL (ISOVUE-300) INJECTION 61% COMPARISON:  CT the abdomen and pelvis 10/17/2013. FINDINGS: Lower chest: 2 mm subpleural pulmonary nodule in the right lower lobe, unchanged compared to the prior examination from 2015, considered definitively benign (presumably a subpleural lymph node). Hepatobiliary: No suspicious cystic or solid hepatic lesions. No intra or extrahepatic biliary ductal dilatation. Tiny calcified gallstones lying dependently in the gallbladder. No findings to suggest an acute  cholecystitis at this time. Pancreas: No pancreatic mass. No pancreatic ductal dilatation. No pancreatic or peripancreatic fluid or inflammatory changes. Spleen: Unremarkable. Adrenals/Urinary Tract: 2.4 cm exophytic low-attenuation nonenhancing lesion in the upper pole of the left kidney, compatible with a simple cyst. Subcentimeter low-attenuation lesion in the upper pole the left kidney, too small to definitively characterize, but statistically also likely a tiny cyst. Right kidney and bilateral adrenal glands are normal in appearance. No hydroureteronephrosis. Urinary bladder is unremarkable in appearance. Stomach/Bowel: Surgical clips near the gastroesophageal junction. Stomach is otherwise normal in appearance. No pathologic dilatation of small bowel or colon. There is extensive colonic diverticulosis, most severe in the descending colon and sigmoid colon. In the mid sigmoid colon there are extensive surrounding inflammatory changes, indicative of an acute diverticulitis. In this region, there is at least one extraluminal fluid and gas collection, best  appreciated on axial image 70 of series 2 and coronal image 57 of series 4, where this measures 2.8 x 4.7 x 2.5 cm. Immediately inferior to this there is a smaller extraluminal collection (axial image 73 of series 2 and coronal image 66 of series 4) measuring 1.7 x 2.4 x 2.2 cm which appears to contain some feculent material. Surrounding these two collections are extensive inflammatory changes in the associated mesocolon and adjacent small mild mesentery, as well as a small volume of fluid. Some of this fluid demonstrates rim enhancement (axial image 67 of series 2), which could simply reflect underlying peritonitis, or could indicate additional developing abscess(es). The appendix is not confidently identified. Vascular/Lymphatic: Aortic atherosclerosis, with mild aneurysmal dilatation of the right common iliac artery which measures up to 17 mm in diameter. No lymphadenopathy identified in the abdomen or pelvis. Reproductive: Status post radical prostatectomy. Other: Trace amount of free fluid in the low anatomic pelvis with enhancement of peritoneal membranes, indicative of peritonitis. Given the presence of extraluminal fluid and gas throughout the low anatomic pelvis, findings are concerning for perforated diverticulitis. Small right inguinal hernia containing only fat. Musculoskeletal: There are no aggressive appearing lytic or blastic lesions noted in the visualized portions of the skeleton. IMPRESSION: 1. Severe acute diverticulitis of the sigmoid colon, with evidence of perforation including what appear to be several small abscesses in the low anatomic pelvis as well as extensive peritonitis. Emergent surgical consultation is strongly recommended. 2. Cholelithiasis without evidence of acute cholecystitis at this time. 3. 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 4. Additional incidental findings, as above. Critical  Value/emergent results were called by telephone at the time of interpretation on 05/03/2018 at 9:07 a.m. to Dr. Derinda Late, who verbally acknowledged these results. Per his direction, the patient was instructed to go to the [facility] Emergency Department as soon as possible. Electronically Signed   By: Vinnie Langton M.D.   On: 05/03/2018 09:08   Dg Inject Diag/thera/inc Needle/cath/plc Epi/lumb/sac W/img  Result Date: 04/16/2018 CLINICAL DATA:  Lumbosacral spondylosis without myelopathy. BILATERAL radicular symptoms. New disc extrusion at L3-L4. FLUOROSCOPY TIME:  38 seconds corresponding to a Dose Area Product of 75.73 Gy*m2 PROCEDURE: The procedure, risks, benefits, and alternatives were explained to the patient. Questions regarding the procedure were encouraged and answered. The patient understands and consents to the procedure. LUMBAR EPIDURAL INJECTION: An interlaminar approach was performed on RIGHT at L3-L4. The overlying skin was cleansed and anesthetized. A 20 gauge epidural needle was advanced using loss-of-resistance technique. DIAGNOSTIC EPIDURAL INJECTION: Injection of  Isovue-M 200 initially showed extra-spinal spread. Further advancement shows a good epidural pattern with spread above and below the level of needle placement, primarily on the RIGHT/midline, with no vascular opacification is seen. THERAPEUTIC EPIDURAL INJECTION: 120.0 mg of Depo-Medrol mixed with 2 mL 1% lidocaine were instilled. The procedure was well-tolerated, and the patient was discharged thirty minutes following the injection in good condition. COMPLICATIONS: None IMPRESSION: Technically successful epidural injection on the RIGHT/midline L3-4 # 1. Electronically Signed   By: Staci Righter M.D.   On: 04/16/2018 15:23    Labs:  CBC: Recent Labs    05/03/18 0957  WBC 11.7*  HGB 14.5  HCT 44.8  PLT 318    COAGS: No results for input(s): INR, APTT in the last 8760 hours.  BMP: Recent Labs     05/03/18 0957  NA 136  K 4.0  CL 99  CO2 24  GLUCOSE 120*  BUN 44*  CALCIUM 9.0  CREATININE 2.06*  GFRNONAA 31*  GFRAA 35*    LIVER FUNCTION TESTS: Recent Labs    05/03/18 0957  BILITOT 1.0  AST 18  ALT 28  ALKPHOS 59  PROT 6.5  ALBUMIN 2.9*    TUMOR MARKERS: No results for input(s): AFPTM, CEA, CA199, CHROMGRNA in the last 8760 hours.  Assessment and Plan: Perforated diverticulitis, multiple intra-abdominal fluid collections Patient admitted to [facility] with multiple fluid collections related to sigmoid diverticulitis.  IR consulted for aspiration and drainage.  Case reviewed and approved by Dr. Barbie Banner.  Patient stable, afebrile.  WBC elevated at 11.7 PA met with patient and wife who are agreeable to procedure.  Patient to be NPO after midnight tonight.  INR ordered.  Risks and benefits discussed with the patient including bleeding, infection, damage to adjacent structures, bowel perforation/fistula connection, and sepsis.  All of the patient's questions were answered, patient is agreeable to proceed. Consent signed and in chart.  Thank you for this interesting consult.  I greatly enjoyed meeting Troy [name] and look forward to participating in their care.  A copy of this report was sent to the requesting provider on this date.  Electronically Signed: Docia Barrier, PA 05/03/2018, 4:35 PM   I spent a total of 40 Minutes    in face to face in clinical consultation, greater than 50% of which was counseling/coordinating care for intra-abdominal fluid collections.

## 2018-05-03 NOTE — ED Triage Notes (Signed)
Patient sent from [facility] imaging for bowel perforation. Has hx of diverticulitis and has had pain that started a few days ago, none on arrival. Patient pale, alert and oriernted

## 2018-05-03 NOTE — ED Notes (Signed)
Call to [phone]  

## 2018-05-03 NOTE — Progress Notes (Signed)
Pharmacy Antibiotic Note  Troy [name] is a 72 y.o. male admitted on 05/03/2018 with intra-abdominal infection.  Pharmacy has been consulted for cefepime dosing. Also on Flagyl per MD. SCr 2.06 on admit (baseline 0.9-1.2 from last records 57yrs ago).  Plan: Cefepime 2g IV x 1; then 2g IV q24h Flagyl 500mg  IV q8h Monitor clinical progress, c/s, renal function F/u de-escalation plan/LOT     Temp (24hrs), Avg:97.6 F (36.4 C), Min:97.6 F (36.4 C), Max:97.6 F (36.4 C)  No results for input(s): WBC, CREATININE, LATICACIDVEN, VANCOTROUGH, VANCOPEAK, VANCORANDOM, GENTTROUGH, GENTPEAK, GENTRANDOM, TOBRATROUGH, TOBRAPEAK, TOBRARND, AMIKACINPEAK, AMIKACINTROU, AMIKACIN in the last 168 hours.  CrCl cannot be calculated (Patient's most recent lab result is older than the maximum 21 days allowed.).    Allergies  Allergen Reactions  . Ciprofloxacin Diarrhea and Nausea And Vomiting    Can take [place], PharmD, BCPS Clinical Pharmacist Clinical phone [phone] Please check AMION for all [employer] contact numbers 05/03/2018 9:57 AM

## 2018-05-03 NOTE — ED Provider Notes (Signed)
[facility] EMERGENCY DEPARTMENT Provider Note   CSN: [id number] Arrival date & time: 05/03/18  4098     History   Chief Complaint Chief Complaint  Patient presents with  . bowel perforation/from imaging    HPI Troy [name] is a 72 y.o. male history of previous abdominal hernia repair, prostate cancer status post surgery, here presenting with bowel perforation on CT.  Patient states that he has diffuse lower abdominal pain for the last week or so.  States that it is crampy and worse on the left side.  Patient denies being constipated but thought that he has gas pains.  Denies any nausea or vomiting.  Does have low-grade temperature about 100 at home.  He saw his doctor 2 days ago and was prescribed Levaquin and Flagyl and outpatient CT was ordered.  Had CT done today that showed diverticulitis with multiple abscesses and free air and patient was sent here for evaluation.   The history is provided by the patient.    Past Medical History:  Diagnosis Date  . Abdominal distention    hernia  . Anemia   . Arthritis    big toes   . Cancer [facility])    prostate   . GERD (gastroesophageal reflux disease)   . Hypertension   . Lumbar herniated disc    L5  . Rash    yeast infection from bladder sling surgery   . Sciatica of right side 09-01-11   right leg-tx. Tylenol    Patient Active Problem List   Diagnosis Date Noted  . Status post laparoscopic Nissen fundoplication and repair of large hiatus hernia June 2016 03/10/2015  . Ventral hernia-s/p repair x 2 09/07/2011  . History of prostate cancer-s/p prostatectomy 09/07/2011    Past Surgical History:  Procedure Laterality Date  . HERNIA REPAIR  September 01, 2010  . INCONTINENCE SURGERY  Jan 31, 2011  . INSERTION OF MESH  03/10/2015   Procedure: INSERTION OF MESH;  Surgeon: Johnathan Hausen, MD;  Location: [facility];  Service: General;;  . LAPAROSCOPIC NISSEN FUNDOPLICATION N/A 09/29/1476   Procedure: LAPAROSCOPIC  NISSEN FUNDOPLICATION AND HIATAL HERNIA REPAIR;  Surgeon: Johnathan Hausen, MD;  Location: [facility];  Service: General;  Laterality: N/A;  . PROSTATECTOMY  reb 9, 2010  . TONSILLECTOMY    . UPPER GI ENDOSCOPY N/A 03/10/2015   Procedure: UPPER GI ENDOSCOPY;  Surgeon: Johnathan Hausen, MD;  Location: [facility];  Service: General;  Laterality: N/A;  . VENTRAL HERNIA REPAIR  09/06/2011   Procedure: HERNIA REPAIR VENTRAL ADULT;  Surgeon: Pedro Earls, MD;  Location: [facility];  Service: General;  Laterality: N/A;  . VENTRAL HERNIA REPAIR  09/06/2011   Procedure: LAPAROSCOPIC VENTRAL HERNIA;  Surgeon: Pedro Earls, MD;  Location: [facility];  Service: General;  Laterality: N/A;        Home Medications    Prior to Admission medications   Medication Sig Start Date End Date Taking? Authorizing Provider  ALPRAZolam Duanne Moron) 0.5 MG tablet Take 0.5 mg by mouth at bedtime as needed for anxiety. For sleep.   Yes [provider]  amLODipine (NORVASC) 5 MG tablet Take 5 mg by mouth every morning.   Yes [provider]  Cholecalciferol (VITAMIN D3) 2000 UNITS TABS Take 1 tablet by mouth every morning.   Yes [provider]  ezetimibe (ZETIA) 10 MG tablet Take 10 mg by mouth at bedtime.   Yes [provider]  hydrochlorothiazide (MICROZIDE) 12.5 MG capsule  Take 12.5 mg by mouth every morning.   Yes [provider]  Javier Docker Oil 500 MG CAPS Take 500 mg by mouth daily.   Yes [provider]  losartan (COZAAR) 100 MG tablet Take 100 mg by mouth every morning.   Yes [provider]  metoprolol succinate (TOPROL-XL) 50 MG 24 hr tablet Take 50 mg by mouth daily. Take with or immediately following a meal.   Yes [provider]  nitroGLYCERIN (NITRODUR - DOSED IN MG/24 HR) 0.2 mg/hr patch Place 1 patch (0.2 mg total) onto the skin daily. 11/22/16  Yes Dondra Prader R, NP  rosuvastatin (CRESTOR) 20 MG tablet Take 20 mg by mouth at bedtime.   Yes [provider]  TURMERIC PO Take 1,000 mg by mouth as needed.   Yes [provider]  diclofenac sodium (VOLTAREN) 1 % GEL Apply 2 g topically 4 (four) times daily. Patient not taking: Reported on 05/03/2018 11/09/17   Leandrew Koyanagi, MD  tizanidine (ZANAFLEX) 2 MG capsule Take 1 capsule (2 mg total) by mouth 2 (two) times daily as needed for muscle spasms. Patient not taking: Reported on 05/03/2018 01/18/18   Aundra Dubin, PA-C    Family History Family History  Problem Relation Age of Onset  . Cancer Mother 47       lung     Social History Social History   Tobacco Use  . Smoking status: Never Smoker  . Smokeless tobacco: Never Used  Substance Use Topics  . Alcohol use: Yes    Comment: occ. alcohol  . Drug use: No     Allergies   Ciprofloxacin   Review of Systems Review of Systems  Constitutional: Positive for chills.  Gastrointestinal: Positive for abdominal pain.  All other systems reviewed and are negative.    Physical Exam Updated Vital Signs BP 107/77   Pulse 74   Temp 97.6 F (36.4 C) (Oral)   Resp (!) 21   SpO2 97%   Physical Exam  Constitutional: He is oriented to person, place, and time.  Uncomfortable   HENT:  Head: Normocephalic.  Eyes: Pupils are equal, round, and reactive to light. Conjunctivae and EOM are normal.  Neck: Normal range of motion. Neck supple.  Cardiovascular: Normal rate, regular rhythm and normal heart sounds.  Pulmonary/Chest: Effort normal and breath sounds normal. No stridor. No respiratory distress.  Abdominal:  Distended, + mild diffuse lower abdominal tenderness with mild guarding   Musculoskeletal: Normal range of motion.  Neurological: He is alert and oriented to person, place, and time. No cranial nerve deficit. Coordination normal.  Skin: Skin is warm.  Psychiatric: He has a normal mood and affect.  Nursing note and vitals reviewed.    ED Treatments / Results  Labs (all labs ordered are listed, but only  abnormal results are displayed) Labs Reviewed  COMPREHENSIVE METABOLIC PANEL - Abnormal; Notable for the following components:      Result Value   Glucose, Bld 120 (*)    BUN 44 (*)    Creatinine, Ser 2.06 (*)    Albumin 2.9 (*)    GFR calc non Af Amer 31 (*)    GFR calc Af Amer 35 (*)    All other components within normal limits  CBC WITH DIFFERENTIAL/PLATELET - Abnormal; Notable for the following components:   WBC 11.7 (*)    Neutro Abs 9.9 (*)    All other components within normal limits  URINALYSIS, ROUTINE W REFLEX MICROSCOPIC -  Abnormal; Notable for the following components:   Color, Urine STRAW (*)    Hgb urine dipstick SMALL (*)    All other components within normal limits  CULTURE, BLOOD (ROUTINE X 2)  CULTURE, BLOOD (ROUTINE X 2)  URINE CULTURE  I-STAT CG4 LACTIC ACID, ED    EKG EKG Interpretation  Date/Time:  Friday May 03 2018 11:32:57 EDT Ventricular Rate:  74 PR Interval:    QRS Duration: 91 QT Interval:  408 QTC Calculation: 453 R Axis:   27 Text Interpretation:  Sinus rhythm Low voltage, precordial leads Abnormal R-wave progression, early transition No significant change since last tracing Confirmed by Wandra Arthurs [phone]) on 05/03/2018 11:34:46 AM   Radiology Dg Chest 2 View  Result Date: 05/03/2018 CLINICAL DATA:  Fevers EXAM: CHEST - 2 VIEW COMPARISON:  10/03/2013 FINDINGS: Cardiac shadows within normal limits. The lungs are well aerated bilaterally. Mild degenerative changes of the thoracic spine are noted. IMPRESSION: No active cardiopulmonary disease. Electronically Signed   By: Inez Catalina M.D.   On: 05/03/2018 09:07   Ct Abdomen Pelvis W Contrast  Result Date: 05/03/2018 CLINICAL DATA:  72 year old male with history of left lower quadrant abdominal pain 1 week ago starting antibiotics yesterday. Fever. History of prostate cancer status post prostatectomy. Prior history of hernia repairs. EXAM: CT ABDOMEN AND PELVIS WITH CONTRAST TECHNIQUE:  Multidetector CT imaging of the abdomen and pelvis was performed using the standard protocol following bolus administration of intravenous contrast. CONTRAST:  161mL ISOVUE-300 IOPAMIDOL (ISOVUE-300) INJECTION 61% COMPARISON:  CT the abdomen and pelvis 10/17/2013. FINDINGS: Lower chest: 2 mm subpleural pulmonary nodule in the right lower lobe, unchanged compared to the prior examination from 2015, considered definitively benign (presumably a subpleural lymph node). Hepatobiliary: No suspicious cystic or solid hepatic lesions. No intra or extrahepatic biliary ductal dilatation. Tiny calcified gallstones lying dependently in the gallbladder. No findings to suggest an acute cholecystitis at this time. Pancreas: No pancreatic mass. No pancreatic ductal dilatation. No pancreatic or peripancreatic fluid or inflammatory changes. Spleen: Unremarkable. Adrenals/Urinary Tract: 2.4 cm exophytic low-attenuation nonenhancing lesion in the upper pole of the left kidney, compatible with a simple cyst. Subcentimeter low-attenuation lesion in the upper pole the left kidney, too small to definitively characterize, but statistically also likely a tiny cyst. Right kidney and bilateral adrenal glands are normal in appearance. No hydroureteronephrosis. Urinary bladder is unremarkable in appearance. Stomach/Bowel: Surgical clips near the gastroesophageal junction. Stomach is otherwise normal in appearance. No pathologic dilatation of small bowel or colon. There is extensive colonic diverticulosis, most severe in the descending colon and sigmoid colon. In the mid sigmoid colon there are extensive surrounding inflammatory changes, indicative of an acute diverticulitis. In this region, there is at least one extraluminal fluid and gas collection, best appreciated on axial image 70 of series 2 and coronal image 57 of series 4, where this measures 2.8 x 4.7 x 2.5 cm. Immediately inferior to this there is a smaller extraluminal collection (axial  image 73 of series 2 and coronal image 66 of series 4) measuring 1.7 x 2.4 x 2.2 cm which appears to contain some feculent material. Surrounding these two collections are extensive inflammatory changes in the associated mesocolon and adjacent small mild mesentery, as well as a small volume of fluid. Some of this fluid demonstrates rim enhancement (axial image 67 of series 2), which could simply reflect underlying peritonitis, or could indicate additional developing abscess(es). The appendix is not confidently identified. Vascular/Lymphatic: Aortic atherosclerosis, with mild aneurysmal  dilatation of the right common iliac artery which measures up to 17 mm in diameter. No lymphadenopathy identified in the abdomen or pelvis. Reproductive: Status post radical prostatectomy. Other: Trace amount of free fluid in the low anatomic pelvis with enhancement of peritoneal membranes, indicative of peritonitis. Given the presence of extraluminal fluid and gas throughout the low anatomic pelvis, findings are concerning for perforated diverticulitis. Small right inguinal hernia containing only fat. Musculoskeletal: There are no aggressive appearing lytic or blastic lesions noted in the visualized portions of the skeleton. IMPRESSION: 1. Severe acute diverticulitis of the sigmoid colon, with evidence of perforation including what appear to be several small abscesses in the low anatomic pelvis as well as extensive peritonitis. Emergent surgical consultation is strongly recommended. 2. Cholelithiasis without evidence of acute cholecystitis at this time. 3. 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 4. Additional incidental findings, as above. Critical Value/emergent results were called by telephone at the time of interpretation on 05/03/2018 at 9:07 a.m. to Dr. Derinda Late, who verbally acknowledged these results. Per his direction, the patient was  instructed to go to the [facility] Emergency Department as soon as possible. Electronically Signed   By: Vinnie Langton M.D.   On: 05/03/2018 09:08    Procedures Procedures (including critical care time)  CRITICAL CARE Performed by: Wandra Arthurs   Total critical care time:30 minutes  Critical care time was exclusive of separately billable procedures and treating other patients.  Critical care was necessary to treat or prevent imminent or life-threatening deterioration.  Critical care was time spent personally by me on the following activities: development of treatment plan with patient and/or surrogate as well as nursing, discussions with consultants, evaluation of patient's response to treatment, examination of patient, obtaining history from patient or surrogate, ordering and performing treatments and interventions, ordering and review of laboratory studies, ordering and review of radiographic studies, pulse oximetry and re-evaluation of patient's condition.   Medications Ordered in ED Medications  metroNIDAZOLE (FLAGYL) IVPB 500 mg (0 mg Intravenous Stopped 05/03/18 1124)  ceFEPIme (MAXIPIME) 2 g in sodium chloride 0.9 % 100 mL IVPB (has no administration in time range)  ceFEPIme (MAXIPIME) 2 g in sodium chloride 0.9 % 100 mL IVPB (0 g Intravenous Stopped 05/03/18 1144)  sodium chloride 0.9 % bolus 1,000 mL (0 mLs Intravenous Stopped 05/03/18 1055)    And  sodium chloride 0.9 % bolus 1,000 mL (0 mLs Intravenous Stopped 05/03/18 1055)    And  sodium chloride 0.9 % bolus 1,000 mL (0 mLs Intravenous Stopped 05/03/18 1144)     Initial Impression / Assessment and Plan / ED Course  I have reviewed the triage vital signs and the nursing notes.  Pertinent labs & imaging results that were available during my care of the patient were reviewed by me and considered in my medical decision making (see chart for details).     Troy [name] is a 72 y.o. male here with abdominal pain,  chills. Likely diverticulitis with perforation. Code sepsis initiated and I am concerned that he is septic from perforated diverticulitis . Will get CBC, CMP, lactate, cultures. Will give 30 cc/ kg bolus, cefepime/flagyl per protocol. Will consult surgery.   11:30 AM Lactate nl, WBC 11. Given cefepime, flagyl. Consulted general surgery to see patient.   12:44 PM Surgery saw patient and will admit for bowel perforation from diverticulitis.    Final Clinical Impressions(s) /  ED Diagnoses   Final diagnoses:  Diverticulitis of large intestine with perforation without bleeding    ED Discharge Orders    None       Drenda Freeze, MD 05/03/18 1244

## 2018-05-03 NOTE — ED Notes (Signed)
Dr. Hulen Skains and PA at the bedside.

## 2018-05-04 ENCOUNTER — Inpatient Hospital Stay (HOSPITAL_COMMUNITY): Payer: Medicare Other

## 2018-05-04 LAB — BASIC METABOLIC PANEL
Anion gap: 6 (ref 5–15)
BUN: 27 mg/dL — ABNORMAL HIGH (ref 8–23)
CALCIUM: 8.2 mg/dL — AB (ref 8.9–10.3)
CHLORIDE: 111 mmol/L (ref 98–111)
CO2: 26 mmol/L (ref 22–32)
Creatinine, Ser: 1.47 mg/dL — ABNORMAL HIGH (ref 0.61–1.24)
GFR calc non Af Amer: 46 mL/min — ABNORMAL LOW (ref >60)
GFR, EST AFRICAN AMERICAN: 53 mL/min — AB (ref >60)
GLUCOSE: 138 mg/dL — AB (ref 70–99)
POTASSIUM: 4.2 mmol/L (ref 3.5–5.1)
Sodium: 143 mmol/L (ref 135–145)

## 2018-05-04 LAB — CBC
HEMATOCRIT: 37.9 % — AB (ref 39.0–52.0)
HEMOGLOBIN: 12 g/dL — AB (ref 13.0–17.0)
MCH: 30.7 pg (ref 26.0–34.0)
MCHC: 31.7 g/dL (ref 30.0–36.0)
MCV: 96.9 fL (ref 78.0–100.0)
Platelets: 268 10*3/uL (ref 150–400)
RBC: 3.91 MIL/uL — AB (ref 4.22–5.81)
RDW: 13 % (ref 11.5–15.5)
WBC: 7.8 10*3/uL (ref 4.0–10.5)

## 2018-05-04 LAB — URINE CULTURE: Culture: NO GROWTH

## 2018-05-04 LAB — PROTIME-INR
INR: 1.23
Prothrombin Time: 15.4 seconds — ABNORMAL HIGH (ref 11.4–15.2)

## 2018-05-04 MED ORDER — MIDAZOLAM HCL 2 MG/2ML IJ SOLN
INTRAMUSCULAR | Status: AC
  Filled 2018-05-04: qty 6

## 2018-05-04 MED ORDER — SODIUM CHLORIDE 0.9% FLUSH
5.0000 mL | Freq: Three times a day (TID) | INTRAVENOUS | Status: DC
  Administered 2018-05-04 – 2018-05-06 (×5): 5 mL

## 2018-05-04 MED ORDER — FENTANYL CITRATE (PF) 100 MCG/2ML IJ SOLN
INTRAMUSCULAR | Status: AC | PRN
  Administered 2018-05-04 (×2): 50 ug via INTRAVENOUS

## 2018-05-04 MED ORDER — LIDOCAINE HCL (CARDIAC) PF 100 MG/5ML IV SOSY
PREFILLED_SYRINGE | INTRAVENOUS | Status: AC
  Filled 2018-05-04: qty 5

## 2018-05-04 MED ORDER — LIDOCAINE HCL 1 % IJ SOLN
INTRAMUSCULAR | Status: AC
  Filled 2018-05-04: qty 20

## 2018-05-04 MED ORDER — FENTANYL CITRATE (PF) 100 MCG/2ML IJ SOLN
INTRAMUSCULAR | Status: AC
  Filled 2018-05-04: qty 4

## 2018-05-04 MED ORDER — MIDAZOLAM HCL 2 MG/2ML IJ SOLN
INTRAMUSCULAR | Status: AC | PRN
  Administered 2018-05-04: 1 mg via INTRAVENOUS
  Administered 2018-05-04: 2 mg via INTRAVENOUS
  Administered 2018-05-04: 1 mg via INTRAVENOUS

## 2018-05-04 NOTE — Sedation Documentation (Signed)
Dr. Barbie Banner at bedside to discuss procedure with pt and wife.

## 2018-05-04 NOTE — Plan of Care (Cosign Needed)
Troy [name] is a 72 y.o. male patient. 1. Diverticulitis of large intestine with perforation without bleeding   2. Colonic diverticular abscess    Past Medical History:  Diagnosis Date   Abdominal distention    hernia   Anemia    Arthritis    big toes    Cancer (HCC)    prostate    GERD (gastroesophageal reflux disease)    Hypertension    Lumbar herniated disc    L5   Rash    yeast infection from bladder sling surgery    Sciatica of right side 09-01-11   right leg-tx. Tylenol   Current Facility-Administered Medications  Medication Dose Route Frequency Provider Last Rate Last Dose   acetaminophen (TYLENOL) tablet 1,000 mg  1,000 mg Oral Q6H PRN Saverio Danker, PA-C       ALPRAZolam Duanne Moron) tablet 0.5 mg  0.5 mg Oral QHS PRN Saverio Danker, PA-C       amLODipine (NORVASC) tablet 5 mg  5 mg Oral q morning - 10a Saverio Danker, PA-C   5 mg at 05/04/18 1031   dextrose 5 % and 0.45 % NaCl with KCl 20 mEq/L infusion   Intravenous Continuous Saverio Danker, PA-C 125 mL/hr at 05/04/18 1026     diphenhydrAMINE (BENADRYL) 12.5 MG/5ML elixir 12.5 mg  12.5 mg Oral Q6H PRN Saverio Danker, PA-C       Or   diphenhydrAMINE (BENADRYL) injection 12.5 mg  12.5 mg Intravenous Q6H PRN Saverio Danker, PA-C       enoxaparin (LOVENOX) injection 40 mg  40 mg Subcutaneous Q24H Saverio Danker, PA-C       ezetimibe (ZETIA) tablet 10 mg  10 mg Oral QHS Saverio Danker, PA-C   10 mg at 05/03/18 2135   hydrochlorothiazide (MICROZIDE) capsule 12.5 mg  12.5 mg Oral q morning - 10a Saverio Danker, PA-C   12.5 mg at 05/04/18 1031   lidocaine (XYLOCAINE) 1 % (with pres) injection            losartan (COZAAR) tablet 100 mg  100 mg Oral q morning - 10a Saverio Danker, PA-C   100 mg at 05/04/18 1031   metoprolol succinate (TOPROL-XL) 24 hr tablet 50 mg  50 mg Oral Daily Saverio Danker, PA-C   50 mg at 05/04/18 1031   morphine 2 MG/ML injection 1-2 mg  1-2 mg Intravenous Q3H PRN Saverio Danker, PA-C       ondansetron (ZOFRAN-ODT) disintegrating tablet 4 mg  4 mg Oral Q6H PRN Saverio Danker, PA-C       Or   ondansetron [facility]) injection 4 mg  4 mg Intravenous Q6H PRN Saverio Danker, PA-C       piperacillin-tazobactam (ZOSYN) IVPB 3.375 g  3.375 g Intravenous Q8H Saverio Danker, PA-C 12.5 mL/hr at 05/04/18 0521 3.375 g at 05/04/18 0521   polyethylene glycol (MIRALAX / GLYCOLAX) packet 17 g  17 g Oral Daily PRN Saverio Danker, PA-C       rosuvastatin (CRESTOR) tablet 20 mg  20 mg Oral QHS Saverio Danker, PA-C   20 mg at 05/03/18 2135   simethicone (MYLICON) chewable tablet 40 mg  40 mg Oral Q6H PRN Saverio Danker, PA-C       sodium chloride flush (NS) 0.9 % injection 5 mL  5 mL Intracatheter Q8H Hoss, Arthur, MD       traMADol Veatrice Bourbon) tablet 50 mg  50 mg Oral Q6H PRN Saverio Danker, PA-C       zolpidem (AMBIEN) tablet 5  mg  5 mg Oral QHS PRN Saverio Danker, PA-C   5 mg at 05/03/18 2135   Allergies  Allergen Reactions   Ciprofloxacin Diarrhea and Nausea And Vomiting    Can take Levaquin   Active Problems:   Diverticulitis  Blood pressure 127/79, pulse 62, temperature 97.9 F (36.6 C), temperature source Oral, resp. rate 17, height 5\' 9"  (1.753 m), weight 104.3 kg, SpO2 98 %.  Subjective Objective Assessment & Plan  Troy [name] 05/04/2018

## 2018-05-04 NOTE — Procedures (Signed)
RLQ 10 Fr diverticular abscess drain 5 cc pus EBL 0 Comp 0

## 2018-05-04 NOTE — Progress Notes (Signed)
Subjective/Chief Complaint: Just got back from IR. Drain in place. Denies pain. Very happy right now   Objective: Vital signs in last 24 hours: Temp:  [97.5 F (36.4 C)-98.4 F (36.9 C)] 98.4 F (36.9 C) (08/24 0555) Pulse Rate:  [62-88] 63 (08/24 0951) Resp:  [12-24] 15 (08/24 0951) BP: (107-137)/(62-96) 120/74 (08/24 0951) SpO2:  [95 %-99 %] 96 % (08/24 0951) Weight:  [104.3 kg] 104.3 kg (08/23 1509) Last BM Date: 05/03/18  Intake/Output from previous day: 08/23 0701 - 08/24 0700 In: 3493.9 [P.O.:120; I.V.:223.9; IV Piggyback:3150] Out: 1350 [Urine:1350] Intake/Output this shift: No intake/output data recorded.  General appearance: alert and cooperative Resp: clear to auscultation bilaterally Cardio: regular rate and rhythm GI: soft, nontender. small amount of pus in drain tube  Lab Results:  Recent Labs    05/03/18 0957 05/04/18 0505  WBC 11.7* 7.8  HGB 14.5 12.0*  HCT 44.8 37.9*  PLT 318 268   BMET Recent Labs    05/03/18 0957 05/04/18 0505  NA 136 143  K 4.0 4.2  CL 99 111  CO2 24 26  GLUCOSE 120* 138*  BUN 44* 27*  CREATININE 2.06* 1.47*  CALCIUM 9.0 8.2*   PT/INR Recent Labs    05/03/18 0957 05/04/18 0505  LABPROT 14.8 15.4*  INR 1.17 1.23   ABG No results for input(s): PHART, HCO3 in the last 72 hours.  Invalid input(s): PCO2, PO2  Studies/Results: Dg Chest 2 View  Result Date: 05/03/2018 CLINICAL DATA:  Fevers EXAM: CHEST - 2 VIEW COMPARISON:  10/03/2013 FINDINGS: Cardiac shadows within normal limits. The lungs are well aerated bilaterally. Mild degenerative changes of the thoracic spine are noted. IMPRESSION: No active cardiopulmonary disease. Electronically Signed   By: Inez Catalina M.D.   On: 05/03/2018 09:07   Ct Abdomen Pelvis W Contrast  Result Date: 05/03/2018 CLINICAL DATA:  72 year old male with history of left lower quadrant abdominal pain 1 week ago starting antibiotics yesterday. Fever. History of prostate cancer  status post prostatectomy. Prior history of hernia repairs. EXAM: CT ABDOMEN AND PELVIS WITH CONTRAST TECHNIQUE: Multidetector CT imaging of the abdomen and pelvis was performed using the standard protocol following bolus administration of intravenous contrast. CONTRAST:  169mL ISOVUE-300 IOPAMIDOL (ISOVUE-300) INJECTION 61% COMPARISON:  CT the abdomen and pelvis 10/17/2013. FINDINGS: Lower chest: 2 mm subpleural pulmonary nodule in the right lower lobe, unchanged compared to the prior examination from 2015, considered definitively benign (presumably a subpleural lymph node). Hepatobiliary: No suspicious cystic or solid hepatic lesions. No intra or extrahepatic biliary ductal dilatation. Tiny calcified gallstones lying dependently in the gallbladder. No findings to suggest an acute cholecystitis at this time. Pancreas: No pancreatic mass. No pancreatic ductal dilatation. No pancreatic or peripancreatic fluid or inflammatory changes. Spleen: Unremarkable. Adrenals/Urinary Tract: 2.4 cm exophytic low-attenuation nonenhancing lesion in the upper pole of the left kidney, compatible with a simple cyst. Subcentimeter low-attenuation lesion in the upper pole the left kidney, too small to definitively characterize, but statistically also likely a tiny cyst. Right kidney and bilateral adrenal glands are normal in appearance. No hydroureteronephrosis. Urinary bladder is unremarkable in appearance. Stomach/Bowel: Surgical clips near the gastroesophageal junction. Stomach is otherwise normal in appearance. No pathologic dilatation of small bowel or colon. There is extensive colonic diverticulosis, most severe in the descending colon and sigmoid colon. In the mid sigmoid colon there are extensive surrounding inflammatory changes, indicative of an acute diverticulitis. In this region, there is at least one extraluminal fluid and gas collection, best  appreciated on axial image 70 of series 2 and coronal image 57 of series 4, where  this measures 2.8 x 4.7 x 2.5 cm. Immediately inferior to this there is a smaller extraluminal collection (axial image 73 of series 2 and coronal image 66 of series 4) measuring 1.7 x 2.4 x 2.2 cm which appears to contain some feculent material. Surrounding these two collections are extensive inflammatory changes in the associated mesocolon and adjacent small mild mesentery, as well as a small volume of fluid. Some of this fluid demonstrates rim enhancement (axial image 67 of series 2), which could simply reflect underlying peritonitis, or could indicate additional developing abscess(es). The appendix is not confidently identified. Vascular/Lymphatic: Aortic atherosclerosis, with mild aneurysmal dilatation of the right common iliac artery which measures up to 17 mm in diameter. No lymphadenopathy identified in the abdomen or pelvis. Reproductive: Status post radical prostatectomy. Other: Trace amount of free fluid in the low anatomic pelvis with enhancement of peritoneal membranes, indicative of peritonitis. Given the presence of extraluminal fluid and gas throughout the low anatomic pelvis, findings are concerning for perforated diverticulitis. Small right inguinal hernia containing only fat. Musculoskeletal: There are no aggressive appearing lytic or blastic lesions noted in the visualized portions of the skeleton. IMPRESSION: 1. Severe acute diverticulitis of the sigmoid colon, with evidence of perforation including what appear to be several small abscesses in the low anatomic pelvis as well as extensive peritonitis. Emergent surgical consultation is strongly recommended. 2. Cholelithiasis without evidence of acute cholecystitis at this time. 3. 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 4. Additional incidental findings, as above. Critical Value/emergent results were called by telephone at the time of interpretation on 05/03/2018  at 9:07 a.m. to Dr. Derinda Late, who verbally acknowledged these results. Per his direction, the patient was instructed to go to the [facility] Emergency Department as soon as possible. Electronically Signed   By: Vinnie Langton M.D.   On: 05/03/2018 09:08    Anti-infectives: Anti-infectives (From admission, onward)   Start     Dose/Rate Route Frequency Ordered Stop   05/04/18 1100  ceFEPIme (MAXIPIME) 2 g in sodium chloride 0.9 % 100 mL IVPB  Status:  Discontinued     2 g 200 mL/hr over 30 Minutes Intravenous Every 24 hours 05/03/18 1145 05/03/18 1509   05/03/18 1515  piperacillin-tazobactam (ZOSYN) IVPB 3.375 g     3.375 g 12.5 mL/hr over 240 Minutes Intravenous Every 8 hours 05/03/18 1509     05/03/18 1000  ceFEPIme (MAXIPIME) 2 g in sodium chloride 0.9 % 100 mL IVPB     2 g 200 mL/hr over 30 Minutes Intravenous  Once 05/03/18 0945 05/03/18 1144   05/03/18 1000  metroNIDAZOLE (FLAGYL) IVPB 500 mg  Status:  Discontinued     500 mg 100 mL/hr over 60 Minutes Intravenous Every 8 hours 05/03/18 0945 05/03/18 1509      Assessment/Plan: s/p * No surgery found * Advance diet. Will allow clears since he has no pain Continue drain Continue zosyn  LOS: 1 day    TOTH III,PAUL S 05/04/2018

## 2018-05-05 ENCOUNTER — Other Ambulatory Visit: Payer: Self-pay

## 2018-05-05 MED ORDER — SACCHAROMYCES BOULARDII 250 MG PO CAPS
250.0000 mg | ORAL_CAPSULE | Freq: Two times a day (BID) | ORAL | Status: DC
  Administered 2018-05-05 – 2018-05-06 (×3): 250 mg via ORAL
  Filled 2018-05-05 (×3): qty 1

## 2018-05-05 MED ORDER — AMOXICILLIN-POT CLAVULANATE 875-125 MG PO TABS
1.0000 | ORAL_TABLET | Freq: Two times a day (BID) | ORAL | Status: DC
  Administered 2018-05-05 – 2018-05-06 (×3): 1 via ORAL
  Filled 2018-05-05 (×3): qty 1

## 2018-05-05 NOTE — Progress Notes (Signed)
Subjective/Chief Complaint: No complaints   Objective: Vital signs in last 24 hours: Temp:  [97.7 F (36.5 C)-98.4 F (36.9 C)] 98.2 F (36.8 C) (08/25 0444) Pulse Rate:  [54-72] 54 (08/25 0444) Resp:  [12-18] 18 (08/25 0444) BP: (109-138)/(62-87) 126/87 (08/25 0444) SpO2:  [95 %-98 %] 95 % (08/25 0444) Last BM Date: 05/03/18  Intake/Output from previous day: 08/24 0701 - 08/25 0700 In: 4868.7 [P.O.:420; I.V.:4198.7; IV Piggyback:250] Out: 275 [Urine:275] Intake/Output this shift: No intake/output data recorded.  General appearance: alert and cooperative Resp: clear to auscultation bilaterally Cardio: regular rate and rhythm GI: soft, nontender. drain output cloudy  Lab Results:  Recent Labs    05/03/18 0957 05/04/18 0505  WBC 11.7* 7.8  HGB 14.5 12.0*  HCT 44.8 37.9*  PLT 318 268   BMET Recent Labs    05/03/18 0957 05/04/18 0505  NA 136 143  K 4.0 4.2  CL 99 111  CO2 24 26  GLUCOSE 120* 138*  BUN 44* 27*  CREATININE 2.06* 1.47*  CALCIUM 9.0 8.2*   PT/INR Recent Labs    05/03/18 0957 05/04/18 0505  LABPROT 14.8 15.4*  INR 1.17 1.23   ABG No results for input(s): PHART, HCO3 in the last 72 hours.  Invalid input(s): PCO2, PO2  Studies/Results: Dg Chest 2 View  Result Date: 05/03/2018 CLINICAL DATA:  Fevers EXAM: CHEST - 2 VIEW COMPARISON:  10/03/2013 FINDINGS: Cardiac shadows within normal limits. The lungs are well aerated bilaterally. Mild degenerative changes of the thoracic spine are noted. IMPRESSION: No active cardiopulmonary disease. Electronically Signed   By: Inez Catalina M.D.   On: 05/03/2018 09:07   Ct Image Guided Drainage By Percutaneous Catheter  Result Date: 05/04/2018 INDICATION: Diverticular abscess EXAM: CT GUIDED DRAINAGE OF PELVIC ABSCESS MEDICATIONS: The patient is currently admitted to the hospital and receiving intravenous antibiotics. The antibiotics were administered within an appropriate time frame prior to the  initiation of the procedure. ANESTHESIA/SEDATION: One mg IV Versed 50 mcg IV Fentanyl Moderate Sedation Time:  10 minutes The patient was continuously monitored during the procedure by the interventional radiology nurse under my direct supervision. COMPLICATIONS: None immediate. TECHNIQUE: Informed written consent was obtained from the patient after a thorough discussion of the procedural risks, benefits and alternatives. All questions were addressed. Maximal Sterile Barrier Technique was utilized including caps, mask, sterile gowns, sterile gloves, sterile drape, hand hygiene and skin antiseptic. A timeout was performed prior to the initiation of the procedure. PROCEDURE: The lower abdomen was prepped with ChloraPrep in a sterile fashion, and a sterile drape was applied covering the operative field. A sterile gown and sterile gloves were used for the procedure. Local anesthesia was provided with 1% Lidocaine. Under CT guidance, an 18 gauge needle was advanced into the right pelvic abscess and removed over an Amplatz wire. Ten [place] dilator followed by a 10 [place] drain were inserted. It was looped and string fixed. 5 cc pus was aspirated. FINDINGS: Imaging documents placement of a 10 French drain into a pelvic abscess. IMPRESSION: Successful CT-guided 10 French pelvic abscess drain Electronically Signed   By: Marybelle Killings M.D.   On: 05/04/2018 11:24    Anti-infectives: Anti-infectives (From admission, onward)   Start     Dose/Rate Route Frequency Ordered Stop   05/05/18 1000  amoxicillin-clavulanate (AUGMENTIN) 875-125 MG per tablet 1 tablet     1 tablet Oral Every 12 hours 05/05/18 0852     05/04/18 1100  ceFEPIme (MAXIPIME) 2 g in sodium  chloride 0.9 % 100 mL IVPB  Status:  Discontinued     2 g 200 mL/hr over 30 Minutes Intravenous Every 24 hours 05/03/18 1145 05/03/18 1509   05/03/18 1515  piperacillin-tazobactam (ZOSYN) IVPB 3.375 g  Status:  Discontinued     3.375 g 12.5 mL/hr over 240 Minutes  Intravenous Every 8 hours 05/03/18 1509 05/05/18 0852   05/03/18 1000  ceFEPIme (MAXIPIME) 2 g in sodium chloride 0.9 % 100 mL IVPB     2 g 200 mL/hr over 30 Minutes Intravenous  Once 05/03/18 0945 05/03/18 1144   05/03/18 1000  metroNIDAZOLE (FLAGYL) IVPB 500 mg  Status:  Discontinued     500 mg 100 mL/hr over 60 Minutes Intravenous Every 8 hours 05/03/18 0945 05/03/18 1509      Assessment/Plan: s/p * No surgery found * Advance diet. Start fulls today Switch to oral augmentin Ambulate Diverticulitis with abscess  LOS: 2 days    TOTH III,Kitzia Camus S 05/05/2018

## 2018-05-06 ENCOUNTER — Other Ambulatory Visit: Payer: Self-pay | Admitting: General Surgery

## 2018-05-06 ENCOUNTER — Other Ambulatory Visit: Payer: Self-pay | Admitting: Radiology

## 2018-05-06 DIAGNOSIS — K651 Peritoneal abscess: Secondary | ICD-10-CM

## 2018-05-06 LAB — CBC WITH DIFFERENTIAL/PLATELET
Abs Immature Granulocytes: 0 10*3/uL (ref 0.0–0.1)
Basophils Absolute: 0 10*3/uL (ref 0.0–0.1)
Basophils Relative: 1 %
EOS ABS: 0.2 10*3/uL (ref 0.0–0.7)
EOS PCT: 3 %
HEMATOCRIT: 42.9 % (ref 39.0–52.0)
Hemoglobin: 13.8 g/dL (ref 13.0–17.0)
IMMATURE GRANULOCYTES: 1 %
LYMPHS ABS: 1 10*3/uL (ref 0.7–4.0)
Lymphocytes Relative: 16 %
MCH: 31 pg (ref 26.0–34.0)
MCHC: 32.2 g/dL (ref 30.0–36.0)
MCV: 96.4 fL (ref 78.0–100.0)
MONOS PCT: 8 %
Monocytes Absolute: 0.5 10*3/uL (ref 0.1–1.0)
NEUTROS PCT: 73 %
Neutro Abs: 4.5 10*3/uL (ref 1.7–7.7)
Platelets: 310 10*3/uL (ref 150–400)
RBC: 4.45 MIL/uL (ref 4.22–5.81)
RDW: 12.3 % (ref 11.5–15.5)
WBC: 6.2 10*3/uL (ref 4.0–10.5)

## 2018-05-06 MED ORDER — AMOXICILLIN-POT CLAVULANATE 875-125 MG PO TABS: 1.0000 | ORAL_TABLET | Freq: Two times a day (BID) | ORAL | 0 refills | Status: AC

## 2018-05-06 MED ORDER — TRAMADOL HCL 50 MG PO TABS: 50.0000 mg | ORAL_TABLET | Freq: Four times a day (QID) | ORAL | 0 refills | Status: DC | PRN

## 2018-05-06 MED ORDER — ACETAMINOPHEN 500 MG PO TABS: 1000.0000 mg | ORAL_TABLET | Freq: Four times a day (QID) | ORAL | 0 refills | Status: DC | PRN

## 2018-05-06 NOTE — Discharge Instructions (Signed)
Flush drain with 5cc of normal saline daily.  Record output and take it with you to your drain clinic appointment   Diverticulitis Diverticulitis is when small pockets in your large intestine (colon) get infected or swollen. This causes stomach pain and watery poop (diarrhea). These pouches are called diverticula. They form in people who have a condition called diverticulosis. Follow these instructions at home: Medicines  Take over-the-counter and prescription medicines only as told by your doctor. These include: ? Antibiotics. ? Pain medicines. ? Fiber pills. ? Probiotics. ? Stool softeners.  Do not drive or use heavy machinery while taking prescription pain medicine.  If you were prescribed an antibiotic, take it as told. Do not stop taking it even if you feel better. General instructions  Follow a diet as told by your doctor.  When you feel better, your doctor may tell you to change your diet. You may need to eat a lot of fiber. Fiber makes it easier to poop (have bowel movements). Healthy foods with fiber include: ? Berries. ? Beans. ? Lentils. ? Green vegetables.  Exercise 3 or more times a week. Aim for 30 minutes each time. Exercise enough to sweat and make your heart beat faster.  Keep all follow-up visits as told. This is important. You may need to have an exam of the large intestine. This is called a colonoscopy. Contact a doctor if:  Your pain does not get better.  You have a hard time eating or drinking.  You are not pooping like normal. Get help right away if:  Your pain gets worse.  Your problems do not get better.  Your problems get worse very fast.  You have a fever.  You throw up (vomit) more than one time.  You have poop that is: ? Bloody. ? Black. ? Tarry. Summary  Diverticulitis is when small pockets in your large intestine (colon) get infected or swollen.  Take medicines only as told by your doctor.  Follow a diet as told by your  doctor. This information is not intended to replace advice given to you by your health care provider. Make sure you discuss any questions you have with your health care provider. Document Released: 02/14/2008 Document Revised: 09/14/2016 Document Reviewed: 09/14/2016 Elsevier Interactive Patient Education  2017 [place].  Low-Fiber Diet, 4-6 weeks then switch to high fiber diet. Fiber is found in fruits, vegetables, and whole grains. A low-fiber diet restricts fibrous foods that are not digested in the small intestine. A diet containing about 10-15 grams of fiber per day is considered low fiber. Low-fiber diets may be used to:  Promote healing and rest the bowel during intestinal flare-ups.  Prevent blockage of a partially obstructed or narrowed gastrointestinal tract.  Reduce fecal weight and volume.  Slow the movement of feces.  You may be on a low-fiber diet as a transitional diet following surgery, after an injury (trauma), or because of a short (acute) or lifelong (chronic) illness. Your health care provider will determine the length of time you need to stay on this diet. What do I need to know about a low-fiber diet? Always check the fiber content on the packaging's Nutrition Facts label, especially on foods from the grains list. Ask your dietitian if you have questions about specific foods that are related to your condition, especially if the food is not listed below. In general, a low-fiber food will have less than 2 g of fiber. What foods can I eat? Grains All breads and crackers  made with white flour. Sweet rolls, doughnuts, waffles, pancakes, [place] toast, bagels. Pretzels, Melba toast, zwieback. Well-cooked cereals, such as cornmeal, farina, or cream cereals. Dry cereals that do not contain whole grains, fruit, or nuts, such as refined corn, wheat, rice, and oat cereals. Potatoes prepared any way without skins, plain pastas and noodles, refined white rice. Use white flour for  baking and making sauces. Use allowed list of grains for casseroles, dumplings, and puddings. Vegetables Strained tomato and vegetable juices. Fresh lettuce, cucumber, spinach. Well-cooked (no skin or pulp) or canned vegetables, such as asparagus, bean sprouts, beets, carrots, green beans, mushrooms, potatoes, pumpkin, spinach, yellow squash, tomato sauce/puree, turnips, yams, and zucchini. Keep servings limited to  cup. Fruits All fruit juices except prune juice. Cooked or canned fruits without skin and seeds, such as applesauce, apricots, cherries, fruit cocktail, grapefruit, grapes, mandarin oranges, melons, peaches, pears, pineapple, and plums. Fresh fruits without skin, such as apricots, avocados, bananas, melons, pineapple, nectarines, and peaches. Keep servings limited to  cup or 1 piece. Meat and Other Protein Sources Ground or well-cooked tender beef, ham, veal, lamb, pork, or poultry. Eggs, plain cheese. Fish, oysters, shrimp, lobster, and other seafood. Liver, organ meats. Smooth nut butters. Dairy All milk products and alternative dairy substitutes, such as soy, rice, almond, and coconut, not containing added whole nuts, seeds, or added fruit. Beverages Decaf coffee, fruit, and vegetable juices or smoothies (small amounts, with no pulp or skins, and with fruits from allowed list), sports drinks, herbal tea. Condiments Ketchup, mustard, vinegar, cream sauce, cheese sauce, cocoa powder. Spices in moderation, such as allspice, basil, bay leaves, celery powder or leaves, cinnamon, cumin powder, curry powder, ginger, mace, marjoram, onion or garlic powder, oregano, paprika, parsley flakes, ground pepper, rosemary, sage, savory, tarragon, thyme, and turmeric. Sweets and Desserts Plain cakes and cookies, pie made with allowed fruit, pudding, custard, cream pie. Gelatin, fruit, ice, sherbet, frozen ice pops. Ice cream, ice milk without nuts. Plain hard candy, honey, jelly, molasses, syrup, sugar,  chocolate syrup, gumdrops, marshmallows. Limit overall sugar intake. Fats and Oil Margarine, butter, cream, mayonnaise, salad oils, plain salad dressings made from allowed foods. Choose healthy fats such as olive oil, canola oil, and omega-3 fatty acids (such as found in salmon or tuna) when possible. Other Bouillon, broth, or cream soups made from allowed foods. Any strained soup. Casseroles or mixed dishes made with allowed foods. The items listed above may not be a complete list of recommended foods or beverages. Contact your dietitian for more options. What foods are not recommended? Grains All whole wheat and whole grain breads and crackers. Multigrains, rye, bran seeds, nuts, or coconut. Cereals containing whole grains, multigrains, bran, coconut, nuts, raisins. Cooked or dry oatmeal, steel-cut oats. Coarse wheat cereals, granola. Cereals advertised as high fiber. Potato skins. Whole grain pasta, wild or brown rice. Popcorn. Coconut flour. Bran, buckwheat, corn bread, multigrains, rye, wheat germ. Vegetables Fresh, cooked or canned vegetables, such as artichokes, asparagus, beet greens, broccoli, Brussels sprouts, cabbage, celery, cauliflower, corn, eggplant, kale, legumes or beans, okra, peas, and tomatoes. Avoid large servings of any vegetables, especially raw vegetables. Fruits Fresh fruits, such as apples with or without skin, berries, cherries, figs, grapes, grapefruit, guavas, kiwis, mangoes, oranges, papayas, pears, persimmons, pineapple, and pomegranate. Prune juice and juices with pulp, stewed or dried prunes. Dried fruits, dates, raisins. Fruit seeds or skins. Avoid large servings of all fresh fruits. Meats and Other Protein Sources Tough, fibrous meats with gristle. Chunky nut butter. Cheese  made with seeds, nuts, or other foods not recommended. Nuts, seeds, legumes (beans, including baked beans), dried peas, beans, lentils. Dairy Yogurt or cheese that contains nuts, seeds, or added  fruit. Beverages Fruit juices with high pulp, prune juice. Caffeinated coffee and teas. Condiments Coconut, maple syrup, pickles, olives. Sweets and Desserts Desserts, cookies, or candies that contain nuts or coconut, chunky peanut butter, dried fruits. Jams, preserves with seeds, marmalade. Large amounts of sugar and sweets. Any other dessert made with fruits from the not recommended list. Other Soups made from vegetables that are not recommended or that contain other foods not recommended. The items listed above may not be a complete list of foods and beverages to avoid. Contact your dietitian for more information. This information is not intended to replace advice given to you by your health care provider. Make sure you discuss any questions you have with your health care provider. Document Released: 02/17/2002 Document Revised: 02/03/2016 Document Reviewed: 07/21/2013 Elsevier Interactive Patient Education  2017 [place].   High-Fiber Diet Fiber, also called dietary fiber, is a type of carbohydrate found in fruits, vegetables, whole grains, and beans. A high-fiber diet can have many health benefits. Your health care provider may recommend a high-fiber diet to help:  Prevent constipation. Fiber can make your bowel movements more regular.  Lower your cholesterol.  Relieve hemorrhoids, uncomplicated diverticulosis, or irritable bowel syndrome.  Prevent overeating as part of a weight-loss plan.  Prevent heart disease, type 2 diabetes, and certain cancers.  What is my plan? The recommended daily intake of fiber includes:  38 grams for men under age 52.  63 grams for men over age 56.  36 grams for women under age 71.  90 grams for women over age 51.  You can get the recommended daily intake of dietary fiber by eating a variety of fruits, vegetables, grains, and beans. Your health care provider may also recommend a fiber supplement if it is not possible to get enough fiber  through your diet. What do I need to know about a high-fiber diet?  Fiber supplements have not been widely studied for their effectiveness, so it is better to get fiber through food sources.  Always check the fiber content on thenutrition facts label of any prepackaged food. Look for foods that contain at least 5 grams of fiber per serving.  Ask your dietitian if you have questions about specific foods that are related to your condition, especially if those foods are not listed in the following section.  Increase your daily fiber consumption gradually. Increasing your intake of dietary fiber too quickly may cause bloating, cramping, or gas.  Drink plenty of water. Water helps you to digest fiber. What foods can I eat? Grains Whole-grain breads. Multigrain cereal. Oats and oatmeal. Brown rice. Barley. Bulgur wheat. [place]. Bran muffins. Popcorn. Rye wafer crackers. Vegetables Sweet potatoes. Spinach. Kale. Artichokes. Cabbage. Broccoli. Green peas. Carrots. Squash. Fruits Berries. Pears. Apples. Oranges. Avocados. Prunes and raisins. Dried figs. Meats and Other Protein Sources [place], kidney, pinto, and soy beans. Split peas. Lentils. Nuts and seeds. Dairy Fiber-fortified yogurt. Beverages Fiber-fortified soy milk. Fiber-fortified orange juice. Other Fiber bars. The items listed above may not be a complete list of recommended foods or beverages. Contact your dietitian for more options. What foods are not recommended? Grains White bread. Pasta made with refined flour. White rice. Vegetables Fried potatoes. Canned vegetables. Well-cooked vegetables. Fruits Fruit juice. Cooked, strained fruit. Meats and Other Protein Sources Fatty cuts of meat. [place]  poultry or fried fish. Dairy Milk. Yogurt. Cream cheese. Sour cream. Beverages Soft drinks. Other Cakes and pastries. Butter and oils. The items listed above may not be a complete list of foods and beverages to avoid. Contact your  dietitian for more information. What are some tips for including high-fiber foods in my diet?  Eat a wide variety of high-fiber foods.  Make sure that half of all grains consumed each day are whole grains.  Replace breads and cereals made from refined flour or white flour with whole-grain breads and cereals.  Replace white rice with brown rice, bulgur wheat, or millet.  Start the day with a breakfast that is high in fiber, such as a cereal that contains at least 5 grams of fiber per serving.  Use beans in place of meat in soups, salads, or pasta.  Eat high-fiber snacks, such as berries, raw vegetables, nuts, or popcorn. This information is not intended to replace advice given to you by your health care provider. Make sure you discuss any questions you have with your health care provider. Document Released: 08/28/2005 Document Revised: 02/03/2016 Document Reviewed: 02/10/2014 Elsevier Interactive Patient Education  [employer].

## 2018-05-06 NOTE — Progress Notes (Signed)
Supervising Physician: Corrie Mckusick  Patient Status: [facility] - In-pt  History of Present Illness: Troy [name] is a 72 y.o. male with diverticular abscess s/p perc drain 8/24. Feeling better, sitting up in chair. Has tolerated full liquids. Planning for discharge today.  Past Medical History:  Diagnosis Date  . Abdominal distention    hernia  . Anemia   . Arthritis    big toes   . Cancer [facility])    prostate   . GERD (gastroesophageal reflux disease)   . Hypertension   . Lumbar herniated disc    L5  . Rash    yeast infection from bladder sling surgery   . Sciatica of right side 09-01-11   right leg-tx. Tylenol    Past Surgical History:  Procedure Laterality Date  . HERNIA REPAIR  September 01, 2010  . INCONTINENCE SURGERY  Jan 31, 2011  . INSERTION OF MESH  03/10/2015   Procedure: INSERTION OF MESH;  Surgeon: Johnathan Hausen, MD;  Location: [facility];  Service: General;;  . LAPAROSCOPIC NISSEN FUNDOPLICATION N/A 1/61/0960   Procedure: LAPAROSCOPIC NISSEN FUNDOPLICATION AND HIATAL HERNIA REPAIR;  Surgeon: Johnathan Hausen, MD;  Location: [facility];  Service: General;  Laterality: N/A;  . PROSTATECTOMY  reb 9, 2010  . TONSILLECTOMY    . UPPER GI ENDOSCOPY N/A 03/10/2015   Procedure: UPPER GI ENDOSCOPY;  Surgeon: Johnathan Hausen, MD;  Location: [facility];  Service: General;  Laterality: N/A;  . VENTRAL HERNIA REPAIR  09/06/2011   Procedure: HERNIA REPAIR VENTRAL ADULT;  Surgeon: Pedro Earls, MD;  Location: [facility];  Service: General;  Laterality: N/A;  . VENTRAL HERNIA REPAIR  09/06/2011   Procedure: LAPAROSCOPIC VENTRAL HERNIA;  Surgeon: Pedro Earls, MD;  Location: [facility];  Service: General;  Laterality: N/A;    Allergies: Ciprofloxacin  Medications:  Current Facility-Administered Medications:  .  acetaminophen (TYLENOL) tablet 1,000 mg, 1,000 mg, Oral, Q6H PRN, Saverio Danker, PA-C, 1,000 mg at 05/04/18 1201 .  ALPRAZolam Duanne Moron) tablet 0.5 mg, 0.5 mg, Oral, QHS  PRN, Saverio Danker, PA-C .  amLODipine (NORVASC) tablet 5 mg, 5 mg, Oral, q morning - 10a, Saverio Danker, PA-C, 5 mg at 05/06/18 1016 .  amoxicillin-clavulanate (AUGMENTIN) 875-125 MG per tablet 1 tablet, 1 tablet, Oral, Q12H, Autumn Messing III, MD, 1 tablet at 05/06/18 1016 .  dextrose 5 % and 0.45 % NaCl with KCl 20 mEq/L infusion, , Intravenous, Continuous, Autumn Messing III, MD, Last Rate: 50 mL/hr at 05/06/18 0543 .  diphenhydrAMINE (BENADRYL) 12.5 MG/5ML elixir 12.5 mg, 12.5 mg, Oral, Q6H PRN **OR** diphenhydrAMINE (BENADRYL) injection 12.5 mg, 12.5 mg, Intravenous, Q6H PRN, Saverio Danker, PA-C .  enoxaparin (LOVENOX) injection 40 mg, 40 mg, Subcutaneous, Q24H, Saverio Danker, PA-C, 40 mg at 05/05/18 1457 .  ezetimibe (ZETIA) tablet 10 mg, 10 mg, Oral, QHS, Saverio Danker, PA-C, 10 mg at 05/05/18 2229 .  hydrochlorothiazide (MICROZIDE) capsule 12.5 mg, 12.5 mg, Oral, q morning - 10a, Saverio Danker, PA-C, 12.5 mg at 05/06/18 1017 .  losartan (COZAAR) tablet 100 mg, 100 mg, Oral, q morning - 10a, Saverio Danker, PA-C, 100 mg at 05/06/18 1016 .  metoprolol succinate (TOPROL-XL) 24 hr tablet 50 mg, 50 mg, Oral, Daily, Saverio Danker, PA-C, 50 mg at 05/06/18 1017 .  morphine 2 MG/ML injection 1-2 mg, 1-2 mg, Intravenous, Q3H PRN, Saverio Danker, PA-C .  ondansetron (ZOFRAN-ODT) disintegrating tablet 4 mg, 4 mg, Oral, Q6H PRN **OR** ondansetron (ZOFRAN) injection 4 mg, 4 mg,  Intravenous, Q6H PRN, Saverio Danker, PA-C .  polyethylene glycol (MIRALAX / GLYCOLAX) packet 17 g, 17 g, Oral, Daily PRN, Saverio Danker, PA-C .  rosuvastatin (CRESTOR) tablet 20 mg, 20 mg, Oral, QHS, Saverio Danker, PA-C, 20 mg at 05/05/18 2230 .  saccharomyces boulardii (FLORASTOR) capsule 250 mg, 250 mg, Oral, BID, Autumn Messing III, MD, 250 mg at 05/06/18 1017 .  simethicone (MYLICON) chewable tablet 40 mg, 40 mg, Oral, Q6H PRN, Saverio Danker, PA-C .  sodium chloride flush (NS) 0.9 % injection 5 mL, 5 mL, Intracatheter, Q8H,  Hoss, Arthur, MD, 5 mL at 05/06/18 0542 .  traMADol (ULTRAM) tablet 50 mg, 50 mg, Oral, Q6H PRN, Saverio Danker, PA-C .  zolpidem (AMBIEN) tablet 5 mg, 5 mg, Oral, QHS PRN, Saverio Danker, PA-C, 5 mg at 05/05/18 2230    Family History  Problem Relation Age of Onset  . Cancer Mother 57       lung     Social History   Socioeconomic History  . Marital status: Married    Spouse name: Not on file  . Number of children: Not on file  . Years of education: Not on file  . Highest education level: Not on file  Occupational History  . Not on file  Social Needs  . Financial resource strain: Not on file  . Food insecurity:    Worry: Not on file    Inability: Not on file  . Transportation needs:    Medical: Not on file    Non-medical: Not on file  Tobacco Use  . Smoking status: Never Smoker  . Smokeless tobacco: Never Used  Substance and Sexual Activity  . Alcohol use: Yes    Comment: occ. alcohol  . Drug use: No  . Sexual activity: Yes  Lifestyle  . Physical activity:    Days per week: Not on file    Minutes per session: Not on file  . Stress: Not on file  Relationships  . Social connections:    Talks on phone: Not on file    Gets together: Not on file    Attends religious service: Not on file    Active member of club or organization: Not on file    Attends meetings of clubs or organizations: Not on file    Relationship status: Not on file  Other Topics Concern  . Not on file  Social History Narrative  . Not on file    Review of Systems: A 12 point ROS discussed and pertinent positives are indicated in the HPI above.  All other systems are negative.  Review of Systems  Vital Signs: BP (!) 141/82 (BP Location: Right Arm)   Pulse 62   Temp 98.8 F (37.1 C) (Oral)   Resp 16   Ht 5\' 9"  (1.753 m)   Wt 104.3 kg   SpO2 98%   BMI 33.97 kg/m   Physical Exam  Constitutional: He is oriented to person, place, and time. He appears well-developed. No distress.  HENT:    Head: Normocephalic.  Mouth/Throat: Oropharynx is clear and moist.  Neck: Normal range of motion. No JVD present.  Cardiovascular: Normal rate, regular rhythm and normal heart sounds.  Pulmonary/Chest: Effort normal and breath sounds normal. No respiratory distress.  Abdominal: Soft.  Drain intact, site c/d/i/ Output mix of serous fluid and debris   Neurological: He is alert and oriented to person, place, and time.  Skin: Skin is warm and dry.    Imaging: Dg Chest 2 View  Result Date: 05/03/2018 CLINICAL DATA:  Fevers EXAM: CHEST - 2 VIEW COMPARISON:  10/03/2013 FINDINGS: Cardiac shadows within normal limits. The lungs are well aerated bilaterally. Mild degenerative changes of the thoracic spine are noted. IMPRESSION: No active cardiopulmonary disease. Electronically Signed   By: Inez Catalina M.D.   On: 05/03/2018 09:07   Mr Lumbar Spine Wo Contrast  Result Date: 04/10/2018 CLINICAL DATA:  Right buttock and posterior leg pain for 4 months. History of prostate cancer. EXAM: MRI LUMBAR SPINE WITHOUT CONTRAST TECHNIQUE: Multiplanar, multisequence MR imaging of the lumbar spine was performed. No intravenous contrast was administered. COMPARISON:  10/04/2011 FINDINGS: Segmentation:  Standard. Alignment:  Normal. Vertebrae: No fracture or suspicious osseous lesion. Mild multilevel degenerative endplate changes in the lower thoracic and lumbar spine. Conus medullaris and cauda equina: Conus extends to the L1 level. Conus and cauda equina appear normal. Paraspinal and other soft tissues: Partially visualized T2 hyperintense renal lesions with the largest measuring 2.3 cm in the left upper pole, likely cysts but incompletely evaluated. Similar appearance of bilateral common iliac artery ectasia. Disc levels: Disc desiccation and mild disc space narrowing throughout the lumbar spine. T11-12: Mild right facet arthrosis without disc herniation or stenosis, unchanged. T12-L1: Minimal disc bulging and mild  facet hypertrophy without stenosis, unchanged. L1-2: Mild disc bulging, increased from prior. New small left foraminal and extraforaminal disc protrusion. Mild bilateral facet hypertrophy. No significant stenosis. L2-3: Mild circumferential disc bulging and moderate facet and ligamentum flavum hypertrophy, stable to minimally progressed. Borderline spinal stenosis. Patent neural foramina. L3-4: Circumferential disc bulging, a new small to moderate-sized central disc extrusion, and moderate facet and ligamentum flavum hypertrophy result in increased, moderate spinal stenosis and mild bilateral neural foraminal stenosis. L4-5: Circumferential disc bulging and moderate facet and ligamentum flavum hypertrophy result in moderate spinal stenosis, mild right and mild-to-moderate left lateral recess stenosis, and minimal bilateral neural foraminal stenosis. The small right subarticular disc extrusion on the prior study has regressed with improved right lateral recess patency. L5-S1: A broad central disc protrusion approaches and may contact the S1 nerve roots (at least on the right) without evidence of neural compression or significant stenosis. The left paracentral component of the disc protrusion is slightly smaller than on the prior study. There is mild facet hypertrophy without neural foraminal stenosis. IMPRESSION: 1. New central disc extrusion at L3-4 with moderate spinal stenosis. 2. 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 Regression of right subarticular disc extrusion with improved right lateral recess patency. 3. New left foraminal/extraforaminal disc protrusion at L1-2 without stenosis. 4. Slightly decreased size of L5-S1 disc protrusion without stenosis. Electronically Signed   By: Logan Bores M.D.   On: 04/10/2018 09:00   Ct Abdomen Pelvis W Contrast  Result Date: 05/03/2018 CLINICAL DATA:  72 year old male with history of left lower quadrant abdominal pain 1 week ago starting antibiotics yesterday.  Fever. History of prostate cancer status post prostatectomy. Prior history of hernia repairs. EXAM: CT ABDOMEN AND PELVIS WITH CONTRAST TECHNIQUE: Multidetector CT imaging of the abdomen and pelvis was performed using the standard protocol following bolus administration of intravenous contrast. CONTRAST:  141mL ISOVUE-300 IOPAMIDOL (ISOVUE-300) INJECTION 61% COMPARISON:  CT the abdomen and pelvis 10/17/2013. FINDINGS: Lower chest: 2 mm subpleural pulmonary nodule in the right lower lobe, unchanged compared to the prior examination from 2015, considered definitively benign (presumably a subpleural lymph node). Hepatobiliary: No suspicious cystic or solid hepatic lesions. No intra or extrahepatic biliary ductal dilatation. Tiny calcified gallstones lying dependently in the  gallbladder. No findings to suggest an acute cholecystitis at this time. Pancreas: No pancreatic mass. No pancreatic ductal dilatation. No pancreatic or peripancreatic fluid or inflammatory changes. Spleen: Unremarkable. Adrenals/Urinary Tract: 2.4 cm exophytic low-attenuation nonenhancing lesion in the upper pole of the left kidney, compatible with a simple cyst. Subcentimeter low-attenuation lesion in the upper pole the left kidney, too small to definitively characterize, but statistically also likely a tiny cyst. Right kidney and bilateral adrenal glands are normal in appearance. No hydroureteronephrosis. Urinary bladder is unremarkable in appearance. Stomach/Bowel: Surgical clips near the gastroesophageal junction. Stomach is otherwise normal in appearance. No pathologic dilatation of small bowel or colon. There is extensive colonic diverticulosis, most severe in the descending colon and sigmoid colon. In the mid sigmoid colon there are extensive surrounding inflammatory changes, indicative of an acute diverticulitis. In this region, there is at least one extraluminal fluid and gas collection, best appreciated on axial image 70 of series 2 and  coronal image 57 of series 4, where this measures 2.8 x 4.7 x 2.5 cm. Immediately inferior to this there is a smaller extraluminal collection (axial image 73 of series 2 and coronal image 66 of series 4) measuring 1.7 x 2.4 x 2.2 cm which appears to contain some feculent material. Surrounding these two collections are extensive inflammatory changes in the associated mesocolon and adjacent small mild mesentery, as well as a small volume of fluid. Some of this fluid demonstrates rim enhancement (axial image 67 of series 2), which could simply reflect underlying peritonitis, or could indicate additional developing abscess(es). The appendix is not confidently identified. Vascular/Lymphatic: Aortic atherosclerosis, with mild aneurysmal dilatation of the right common iliac artery which measures up to 17 mm in diameter. No lymphadenopathy identified in the abdomen or pelvis. Reproductive: Status post radical prostatectomy. Other: Trace amount of free fluid in the low anatomic pelvis with enhancement of peritoneal membranes, indicative of peritonitis. Given the presence of extraluminal fluid and gas throughout the low anatomic pelvis, findings are concerning for perforated diverticulitis. Small right inguinal hernia containing only fat. Musculoskeletal: There are no aggressive appearing lytic or blastic lesions noted in the visualized portions of the skeleton. IMPRESSION: 1. Severe acute diverticulitis of the sigmoid colon, with evidence of perforation including what appear to be several small abscesses in the low anatomic pelvis as well as extensive peritonitis. Emergent surgical consultation is strongly recommended. 2. Cholelithiasis without evidence of acute cholecystitis at this time. 3. 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 4. Additional incidental findings, as above. Critical Value/emergent results were called by telephone at the  time of interpretation on 05/03/2018 at 9:07 a.m. to Dr. Derinda Late, who verbally acknowledged these results. Per his direction, the patient was instructed to go to the [facility] Emergency Department as soon as possible. Electronically Signed   By: Vinnie Langton M.D.   On: 05/03/2018 09:08   Dg Inject Diag/thera/inc Needle/cath/plc Epi/lumb/sac W/img  Result Date: 04/16/2018 CLINICAL DATA:  Lumbosacral spondylosis without myelopathy. BILATERAL radicular symptoms. New disc extrusion at L3-L4. FLUOROSCOPY TIME:  38 seconds corresponding to a Dose Area Product of 75.73 Gy*m2 PROCEDURE: The procedure, risks, benefits, and alternatives were explained to the patient. Questions regarding the procedure were encouraged and answered. The patient understands and consents to the procedure. LUMBAR EPIDURAL INJECTION: An interlaminar approach was performed on RIGHT at L3-L4. The overlying skin was cleansed and anesthetized. A 20 gauge epidural needle was advanced using  loss-of-resistance technique. DIAGNOSTIC EPIDURAL INJECTION: Injection of Isovue-M 200 initially showed extra-spinal spread. Further advancement shows a good epidural pattern with spread above and below the level of needle placement, primarily on the RIGHT/midline, with no vascular opacification is seen. THERAPEUTIC EPIDURAL INJECTION: 120.0 mg of Depo-Medrol mixed with 2 mL 1% lidocaine were instilled. The procedure was well-tolerated, and the patient was discharged thirty minutes following the injection in good condition. COMPLICATIONS: None IMPRESSION: Technically successful epidural injection on the RIGHT/midline L3-4 # 1. Electronically Signed   By: Staci Righter M.D.   On: 04/16/2018 15:23   Ct Image Guided Drainage By Percutaneous Catheter  Result Date: 05/04/2018 INDICATION: Diverticular abscess EXAM: CT GUIDED DRAINAGE OF PELVIC ABSCESS MEDICATIONS: The patient is currently admitted to the hospital and receiving intravenous antibiotics.  The antibiotics were administered within an appropriate time frame prior to the initiation of the procedure. ANESTHESIA/SEDATION: One mg IV Versed 50 mcg IV Fentanyl Moderate Sedation Time:  10 minutes The patient was continuously monitored during the procedure by the interventional radiology nurse under my direct supervision. COMPLICATIONS: None immediate. TECHNIQUE: Informed written consent was obtained from the patient after a thorough discussion of the procedural risks, benefits and alternatives. All questions were addressed. Maximal Sterile Barrier Technique was utilized including caps, mask, sterile gowns, sterile gloves, sterile drape, hand hygiene and skin antiseptic. A timeout was performed prior to the initiation of the procedure. PROCEDURE: The lower abdomen was prepped with ChloraPrep in a sterile fashion, and a sterile drape was applied covering the operative field. A sterile gown and sterile gloves were used for the procedure. Local anesthesia was provided with 1% Lidocaine. Under CT guidance, an 18 gauge needle was advanced into the right pelvic abscess and removed over an Amplatz wire. Ten [place] dilator followed by a 10 [place] drain were inserted. It was looped and string fixed. 5 cc pus was aspirated. FINDINGS: Imaging documents placement of a 10 French drain into a pelvic abscess. IMPRESSION: Successful CT-guided 10 French pelvic abscess drain Electronically Signed   By: Marybelle Killings M.D.   On: 05/04/2018 11:24    Labs:  CBC: Recent Labs    05/03/18 0957 05/04/18 0505 05/06/18 0506  WBC 11.7* 7.8 6.2  HGB 14.5 12.0* 13.8  HCT 44.8 37.9* 42.9  PLT 318 268 310    COAGS: Recent Labs    05/03/18 0957 05/04/18 0505  INR 1.17 1.23    BMP: Recent Labs    05/03/18 0957 05/04/18 0505  NA 136 143  K 4.0 4.2  CL 99 111  CO2 24 26  GLUCOSE 120* 138*  BUN 44* 27*  CALCIUM 9.0 8.2*  CREATININE 2.06* 1.47*  GFRNONAA 31* 46*  GFRAA 35* 53*    LIVER FUNCTION TESTS: Recent  Labs    05/03/18 0957  BILITOT 1.0  AST 18  ALT 28  ALKPHOS 59  PROT 6.5  ALBUMIN 2.9*    TUMOR MARKERS: No results for input(s): AFPTM, CEA, CA199, CHROMGRNA in the last 8760 hours.  Assessment and Plan: S/p diverticular abscess drain 8/24 For discharge today Reviewed flushing instructions. Will set up outpt follow up CT and drain injection.  Thank you for this interesting consult.  I greatly enjoyed meeting WOFFORD STRATTON and look forward to participating in their care.  A copy of this report was sent to the requesting provider on this date.  Electronically Signed: Ascencion Dike, PA-C 05/06/2018, 10:17 AM   I spent a total of 15 minutes in face to  face in clinical consultation, greater than 50% of which was counseling/coordinating care for diverticular abscess drainage

## 2018-05-06 NOTE — Progress Notes (Signed)
Discharge home. homd ischarge instruction given, no question verbalized.

## 2018-05-06 NOTE — Discharge Summary (Signed)
[facility] Discharge Summary   Patient ID: Troy [name] MRN: [id number] DOB/AGE: 1945/10/22 72 y.o.  Admit date: 05/03/2018 Discharge date: 05/06/2018  Admitting Diagnosis: Diverticulitis with abscess  Discharge Diagnosis Patient Active Problem List   Diagnosis Date Noted  . Diverticulitis 05/03/2018  . Status post laparoscopic Nissen fundoplication and repair of large hiatus hernia June 2016 03/10/2015  . Ventral hernia-s/p repair x 2 09/07/2011  . History of prostate cancer-s/p prostatectomy 09/07/2011    Consultants Interventional radiology  Imaging: Ct Image Guided Drainage By Percutaneous Catheter  Result Date: 05/04/2018 INDICATION: Diverticular abscess EXAM: CT GUIDED DRAINAGE OF PELVIC ABSCESS MEDICATIONS: The patient is currently admitted to the hospital and receiving intravenous antibiotics. The antibiotics were administered within an appropriate time frame prior to the initiation of the procedure. ANESTHESIA/SEDATION: One mg IV Versed 50 mcg IV Fentanyl Moderate Sedation Time:  10 minutes The patient was continuously monitored during the procedure by the interventional radiology nurse under my direct supervision. COMPLICATIONS: None immediate. TECHNIQUE: Informed written consent was obtained from the patient after a thorough discussion of the procedural risks, benefits and alternatives. All questions were addressed. Maximal Sterile Barrier Technique was utilized including caps, mask, sterile gowns, sterile gloves, sterile drape, hand hygiene and skin antiseptic. A timeout was performed prior to the initiation of the procedure. PROCEDURE: The lower abdomen was prepped with ChloraPrep in a sterile fashion, and a sterile drape was applied covering the operative field. A sterile gown and sterile gloves were used for the procedure. Local anesthesia was provided with 1% Lidocaine. Under CT guidance, an 18 gauge needle was advanced into the right pelvic abscess and  removed over an Amplatz wire. Ten [place] dilator followed by a 10 [place] drain were inserted. It was looped and string fixed. 5 cc pus was aspirated. FINDINGS: Imaging documents placement of a 10 French drain into a pelvic abscess. IMPRESSION: Successful CT-guided 10 French pelvic abscess drain Electronically Signed   By: Marybelle Killings M.D.   On: 05/04/2018 11:24    Procedures Dr. Barbie Banner (05/04/18) - percutaneous drainage of intraabdominal abscess  Hospital Course:  Patient is a 72 year old male who presented to [facility] with abdominal pain.  Workup showed acute diverticulitis with multiple pelvic abscesses.  Patient was admitted and underwent procedure listed above.  Tolerated procedure well and was transferred to the floor.  Diet was advanced as tolerated.  On 05/06/18, the patient was voiding well, tolerating diet, ambulating well, pain well controlled, vital signs stable and felt stable for discharge home.  Patient will follow up in our office and the drain clinic and knows to call with questions or concerns.  He will call to confirm appointment date/time.    Physical Exam: General:  Alert, NAD, pleasant, comfortable Chest: RRR, no m/g/r, lungs CTAB Abd:  Soft, ND, no tenderness, +BS, drain with minimal drainage  Ext: distal pulses intact, no edema  Allergies as of 05/06/2018      Reactions   Ciprofloxacin Diarrhea, Nausea And Vomiting   Can take Levaquin      Medication List    TAKE these medications   acetaminophen 500 MG tablet Commonly known as:  TYLENOL Take 2 tablets (1,000 mg total) by mouth every 6 (six) hours as needed for mild pain, fever or headache.   ALPRAZolam 0.5 MG tablet Commonly known as:  XANAX Take 0.5 mg by mouth at bedtime as needed for anxiety. For sleep.   amLODipine 5 MG tablet Commonly known as:  NORVASC Take  5 mg by mouth every morning.   amoxicillin-clavulanate 875-125 MG tablet Commonly known as:  AUGMENTIN Take 1 tablet by mouth every 12 (twelve) hours  for 14 days.   ezetimibe 10 MG tablet Commonly known as:  ZETIA Take 10 mg by mouth at bedtime.   hydrochlorothiazide 12.5 MG capsule Commonly known as:  MICROZIDE Take 12.5 mg by mouth every morning.   Krill Oil 500 MG Caps Take 500 mg by mouth daily.   losartan 100 MG tablet Commonly known as:  COZAAR Take 100 mg by mouth every morning.   metoprolol succinate 50 MG 24 hr tablet Commonly known as:  TOPROL-XL Take 50 mg by mouth daily. Take with or immediately following a meal.   nitroGLYCERIN 0.2 mg/hr patch Commonly known as:  NITRODUR - Dosed in mg/24 hr Place 1 patch (0.2 mg total) onto the skin daily.   rosuvastatin 20 MG tablet Commonly known as:  CRESTOR Take 20 mg by mouth at bedtime.   traMADol 50 MG tablet Commonly known as:  ULTRAM Take 1 tablet (50 mg total) by mouth every 6 (six) hours as needed for severe pain.   TURMERIC PO Take 1,000 mg by mouth as needed.   Vitamin D3 2000 units Tabs Take 1 tablet by mouth every morning.        Follow-up Information    Judeth Horn, MD Follow up in 3 week(s).   Specialty:  General Surgery Contact information: [place] [address] [phone]        Marybelle Killings, MD Follow up in 1 week(s).   Specialties:  Interventional Radiology, Radiology Why:  their office will call you with appointment date and time for your follow up CT scan Contact information: [place] [address] [phone]           Signed: Brigid Re, [facility] 05/06/2018, 10:15 AM Pager: [phone] Consults: [phone] Mon-Fri 7:00 am-4:30 pm Sat-Sun 7:00 am-11:30 am

## 2018-05-08 LAB — CULTURE, BLOOD (ROUTINE X 2)
Culture: NO GROWTH
Culture: NO GROWTH
Special Requests: ADEQUATE
Special Requests: ADEQUATE

## 2018-05-09 ENCOUNTER — Telehealth: Payer: Self-pay | Admitting: Nurse Practitioner

## 2018-05-09 LAB — AEROBIC/ANAEROBIC CULTURE W GRAM STAIN (SURGICAL/DEEP WOUND)

## 2018-05-09 NOTE — Telephone Encounter (Signed)
Phone call from patient regarding recent diverticulitis hospitalization and appointment for epidural steroid injection (ESI) scheduled for 8/30. Per Dr. Jola Baptist, pt informed we will hold off on ESI until diverticulitis flare is resolved, drain is removed and antibiotics are complete. Pt verbalized understanding.

## 2018-05-10 ENCOUNTER — Other Ambulatory Visit: Payer: Medicare Other

## 2018-05-15 ENCOUNTER — Ambulatory Visit
Admission: RE | Admit: 2018-05-15 | Discharge: 2018-05-15 | Disposition: A | Discharge status: Home / Self Care | Payer: Medicare Other | Source: Ambulatory Visit | Attending: Radiology | Admitting: Radiology

## 2018-05-15 ENCOUNTER — Ambulatory Visit
Admission: RE | Admit: 2018-05-15 | Discharge: 2018-05-15 | Disposition: A | Discharge status: Home / Self Care | Payer: Medicare Other | Source: Ambulatory Visit | Attending: General Surgery | Admitting: General Surgery

## 2018-05-15 ENCOUNTER — Encounter: Payer: Self-pay | Admitting: Radiology

## 2018-05-15 DIAGNOSIS — K651 Peritoneal abscess: Secondary | ICD-10-CM

## 2018-05-15 HISTORY — PX: IR RADIOLOGIST EVAL & MGMT: IMG5224

## 2018-05-15 MED ORDER — IOPAMIDOL (ISOVUE-300) INJECTION 61%
100.0000 mL | Freq: Once | INTRAVENOUS | Status: AC | PRN
  Administered 2018-05-15: 100 mL via INTRAVENOUS

## 2018-05-15 NOTE — Progress Notes (Signed)
Referring Physician(s): Dr Chucky May  Chief Complaint: The patient is seen in follow up today s/p Successful CT-guided 10 French pelvic abscess drain 05/04/18- Dr Barbie Banner  History of present illness:    72 year old male with a history diverticular abscess, status post percutaneous drainage 05/04/2018. He presents today for his first CT follow-up.  Denies pain Denies fever-chills Output is minimal ; cloudy Flushing 5 cc daily  Still using Augmentin 2x/day To see [facility] approx 1-2 weeks   Past Medical History:  Diagnosis Date  . Abdominal distention    hernia  . Anemia   . Arthritis    big toes   . Cancer [facility])    prostate   . GERD (gastroesophageal reflux disease)   . Hypertension   . Lumbar herniated disc    L5  . Rash    yeast infection from bladder sling surgery   . Sciatica of right side 09-01-11   right leg-tx. Tylenol    Past Surgical History:  Procedure Laterality Date  . HERNIA REPAIR  September 01, 2010  . INCONTINENCE SURGERY  Jan 31, 2011  . INSERTION OF MESH  03/10/2015   Procedure: INSERTION OF MESH;  Surgeon: Johnathan Hausen, MD;  Location: [facility];  Service: General;;  . IR RADIOLOGIST EVAL & MGMT  05/15/2018  . LAPAROSCOPIC NISSEN FUNDOPLICATION N/A 0/24/0973   Procedure: LAPAROSCOPIC NISSEN FUNDOPLICATION AND HIATAL HERNIA REPAIR;  Surgeon: Johnathan Hausen, MD;  Location: [facility];  Service: General;  Laterality: N/A;  . PROSTATECTOMY  reb 9, 2010  . TONSILLECTOMY    . UPPER GI ENDOSCOPY N/A 03/10/2015   Procedure: UPPER GI ENDOSCOPY;  Surgeon: Johnathan Hausen, MD;  Location: [facility];  Service: General;  Laterality: N/A;  . VENTRAL HERNIA REPAIR  09/06/2011   Procedure: HERNIA REPAIR VENTRAL ADULT;  Surgeon: Pedro Earls, MD;  Location: [facility];  Service: General;  Laterality: N/A;  . VENTRAL HERNIA REPAIR  09/06/2011   Procedure: LAPAROSCOPIC VENTRAL HERNIA;  Surgeon: Pedro Earls, MD;  Location: [facility];  Service: General;  Laterality: N/A;     Allergies: Ciprofloxacin  Medications: Prior to Admission medications   Medication Sig Start Date End Date Taking? Authorizing Provider  acetaminophen (TYLENOL) 500 MG tablet Take 2 tablets (1,000 mg total) by mouth every 6 (six) hours as needed for mild pain, fever or headache. 05/06/18   Rayburn, Floyce Stakes, PA-C  ALPRAZolam (XANAX) 0.5 MG tablet Take 0.5 mg by mouth at bedtime as needed for anxiety. For sleep.    [provider]  amLODipine (NORVASC) 5 MG tablet Take 5 mg by mouth every morning.    [provider]  amoxicillin-clavulanate (AUGMENTIN) 875-125 MG tablet Take 1 tablet by mouth every 12 (twelve) hours for 14 days. 05/06/18 05/20/18  Rayburn, Floyce Stakes, PA-C  Cholecalciferol (VITAMIN D3) 2000 UNITS TABS Take 1 tablet by mouth every morning.    [provider]  ezetimibe (ZETIA) 10 MG tablet Take 10 mg by mouth at bedtime.    [provider]  hydrochlorothiazide (MICROZIDE) 12.5 MG capsule Take 12.5 mg by mouth every morning.    [provider]  Javier Docker Oil 500 MG CAPS Take 500 mg by mouth daily.    [provider]  losartan (COZAAR) 100 MG tablet Take 100 mg by mouth every morning.    [provider]  metoprolol succinate (TOPROL-XL) 50 MG 24 hr tablet Take 50 mg by mouth daily. Take with or immediately following a meal.  [provider]  nitroGLYCERIN (NITRODUR - DOSED IN MG/24 HR) 0.2 mg/hr patch Place 1 patch (0.2 mg total) onto the skin daily. 11/22/16   Suzan Slick, NP  rosuvastatin (CRESTOR) 20 MG tablet Take 20 mg by mouth at bedtime.    [provider]  traMADol (ULTRAM) 50 MG tablet Take 1 tablet (50 mg total) by mouth every 6 (six) hours as needed for severe pain. 05/06/18   Rayburn, Floyce Stakes, PA-C  TURMERIC PO Take 1,000 mg by mouth as needed.    [provider]     Family History  Problem Relation Age of Onset  . Cancer Mother 72       lung     Social History   Socioeconomic  History  . Marital status: Married    Spouse name: Not on file  . Number of children: Not on file  . Years of education: Not on file  . Highest education level: Not on file  Occupational History  . Not on file  Social Needs  . Financial resource strain: Not on file  . Food insecurity:    Worry: Not on file    Inability: Not on file  . Transportation needs:    Medical: Not on file    Non-medical: Not on file  Tobacco Use  . Smoking status: Never Smoker  . Smokeless tobacco: Never Used  Substance and Sexual Activity  . Alcohol use: Yes    Comment: occ. alcohol  . Drug use: No  . Sexual activity: Yes  Lifestyle  . Physical activity:    Days per week: Not on file    Minutes per session: Not on file  . Stress: Not on file  Relationships  . Social connections:    Talks on phone: Not on file    Gets together: Not on file    Attends religious service: Not on file    Active member of club or organization: Not on file    Attends meetings of clubs or organizations: Not on file    Relationship status: Not on file  Other Topics Concern  . Not on file  Social History Narrative  . Not on file     Vital Signs: BP 140/85   Pulse 64   Temp (!) 97.5 F (36.4 C)   SpO2 93%   Physical Exam  Constitutional: He is oriented to person, place, and time.  Neurological: He is alert and oriented to person, place, and time.  Skin: Skin is warm and dry.  Site is clean and dry NT no bleeding OP in bag is cloudy Scant Injection revealed NO FISTULA per Dr Earleen Newport   CT: Pigtail catheter is unchanged in position in the right lower quadrant with complete resolution of the previous abscess  Imaging: Ct Abdomen Pelvis W Contrast  Result Date: 05/15/2018 CLINICAL DATA:  72 year old male with a history diverticular abscess, status post percutaneous drainage 05/04/2018. He presents today for his first CT follow-up. EXAM: CT ABDOMEN AND PELVIS WITH CONTRAST TECHNIQUE: Multidetector CT imaging  of the abdomen and pelvis was performed using the standard protocol following bolus administration of intravenous contrast. CONTRAST:  134mL ISOVUE-300 IOPAMIDOL (ISOVUE-300) INJECTION 61% COMPARISON:  05/04/2018, 05/03/2018 FINDINGS: Lower chest: No acute finding of the lower chest Hepatobiliary: Similar appearance of liver parenchyma with ill-defined hypervascular focus in the right liver, segment 6. Gallbladder demonstrates hyperdense material layer dependently with no inflammatory changes. There does appear to be hyperdense material within the cystic duct with no  intrahepatic or extrahepatic biliary ductal dilatation. Pancreas: Unremarkable pancreas Spleen: Unremarkable spleen Adrenals/Urinary Tract: Unremarkable appearance of the adrenal glands. Right kidney without hydronephrosis or nephrolithiasis. Unremarkable course the right ureter. Redemonstration of low-density lesions of the posterior cortex left kidney the lateral lesion is too small to characterize. The medial lesion is compatible with a Bosniak 1 cyst. Stomach/Bowel: Surgical changes at the GE junction, likely of fundoplication. Otherwise unremarkable stomach. Unremarkable small bowel with no abnormal distention or transition point. Normal appendix. Redemonstration of diverticular change. Significantly improved inflammation within the fat adjacent to sigmoid colon. The pigtail drainage catheter remains in position with no residual fluid and gas collection. No focal wall thickening. Vascular/Lymphatic: Mild atherosclerotic changes of the abdominal aorta. Mesenteric arteries, bilateral renal arteries, bilateral iliac arteries and common femoral arteries are patent. Reproductive: Unremarkable appearance of pelvic structures. Other: Right sided fat containing inguinal hernia. Small fat containing umbilical hernia. Musculoskeletal: Multilevel degenerative changes of the visualized thoracolumbar spine. Vacuum disc phenomenon at L2-L3, L3-L4, L4-L5, L5-S1.  No bony canal narrowing. Degenerative changes of the hips. IMPRESSION: Pigtail catheter is unchanged in position in the right lower quadrant with complete resolution of the previous abscess. Redemonstration of diverticular changes of the colon, with significantly improved inflammation of the colon and adjacent fat. Ill-defined hypervascular focus of the right liver, better seen on today's study than the comparison CT, though was previously present. 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 Evidence of cholelithiasis and potentially choledocholithiasis without acute inflammation. Surgical changes of prior fundoplication. There are 2 left-sided renal lesions. The larger is compatible with Bosniak 1 cyst. The smaller is too small to characterize. Aortic Atherosclerosis (ICD10-I70.0). Electronically Signed   By: Corrie Mckusick D.O.   On: 05/15/2018 13:51   Ir Radiologist Eval & Mgmt  Result Date: 05/15/2018 Please refer to notes tab for details about interventional procedure. (Op Note)   Labs:  CBC: Recent Labs    05/03/18 0957 05/04/18 0505 05/06/18 0506  WBC 11.7* 7.8 6.2  HGB 14.5 12.0* 13.8  HCT 44.8 37.9* 42.9  PLT 318 268 310    COAGS: Recent Labs    05/03/18 0957 05/04/18 0505  INR 1.17 1.23    BMP: Recent Labs    05/03/18 0957 05/04/18 0505  NA 136 143  K 4.0 4.2  CL 99 111  CO2 24 26  GLUCOSE 120* 138*  BUN 44* 27*  CALCIUM 9.0 8.2*  CREATININE 2.06* 1.47*  GFRNONAA 31* 46*  GFRAA 35* 53*    LIVER FUNCTION TESTS: Recent Labs    05/03/18 0957  BILITOT 1.0  AST 18  ALT 28  ALKPHOS 59  PROT 6.5  ALBUMIN 2.9*    Assessment:  Diverticular abscess drain placed 8/24 Discharged 2 days later - afeb; on Augmentin  CT today shows resolution of abscess Drain Inj shows NO FISTULA to bowel Drain removed per Dr Earleen Newport Dressing placed Follow up with [facility] Finish  antibiotics He leaves with good understanding of this plan   Signed: Laquan Beier A, PA-C 05/15/2018, 2:01 PM   Please refer to Dr. Earleen Newport attestation of this note for management and plan.

## 2018-06-14 ENCOUNTER — Other Ambulatory Visit: Payer: Self-pay | Admitting: Family Medicine

## 2018-06-14 ENCOUNTER — Encounter: Payer: Self-pay | Admitting: Radiology

## 2018-06-14 DIAGNOSIS — K5792 Diverticulitis of intestine, part unspecified, without perforation or abscess without bleeding: Secondary | ICD-10-CM

## 2018-06-19 ENCOUNTER — Ambulatory Visit
Admission: RE | Admit: 2018-06-19 | Discharge: 2018-06-19 | Disposition: A | Discharge status: Home / Self Care | Payer: Medicare Other | Source: Ambulatory Visit | Attending: Family Medicine | Admitting: Family Medicine

## 2018-06-19 DIAGNOSIS — K5792 Diverticulitis of intestine, part unspecified, without perforation or abscess without bleeding: Secondary | ICD-10-CM

## 2018-06-19 MED ORDER — IOPAMIDOL (ISOVUE-300) INJECTION 61%
100.0000 mL | Freq: Once | INTRAVENOUS | Status: AC | PRN
  Administered 2018-06-19: 100 mL via INTRAVENOUS

## 2018-11-15 ENCOUNTER — Other Ambulatory Visit: Payer: Self-pay | Admitting: Family Medicine

## 2018-11-15 ENCOUNTER — Ambulatory Visit
Admission: RE | Admit: 2018-11-15 | Discharge: 2018-11-15 | Disposition: A | Discharge status: Home / Self Care | Payer: Medicare Other | Source: Ambulatory Visit | Attending: Family Medicine | Admitting: Family Medicine

## 2018-11-15 DIAGNOSIS — R0789 Other chest pain: Secondary | ICD-10-CM

## 2019-03-28 IMAGING — CT CT ABD-PELV W/ CM
1 of 3 series · 12 of 32 positions shown, 17 images · IV contrast (APPLIED)
Comparison: CT the abdomen and pelvis 10/17/2013.

CLINICAL DATA: 72-year-old male with history of left lower quadrant
abdominal pain 1 week ago starting antibiotics yesterday. Fever.
History of prostate cancer status post prostatectomy. Prior history
of hernia repairs.

EXAM:
CT ABDOMEN AND PELVIS WITH CONTRAST
TECHNIQUE: Multidetector CT imaging of the abdomen and pelvis was performed
using the standard protocol following bolus administration of
intravenous contrast.
CONTRAST:  100mL 0LYNDQ-AUU IOPAMIDOL (0LYNDQ-AUU) INJECTION 61%

[Series 2: abd/pelvis w/cm · axial · 0.88mm/px · z∈[-454,-59]mm · 12 of 93 slices shown, 17 images]
[im 7/93  soft-tissue]
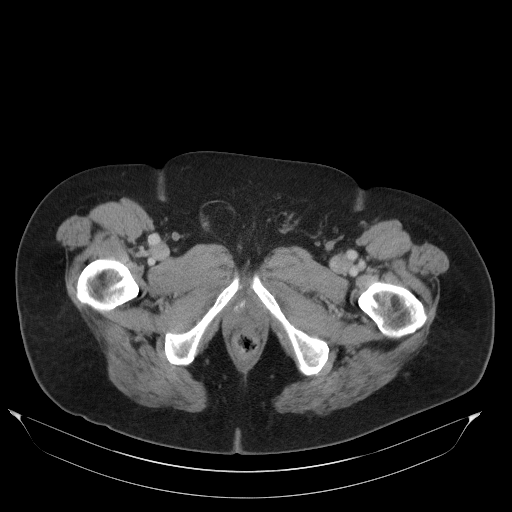
[im 7/93  bone]
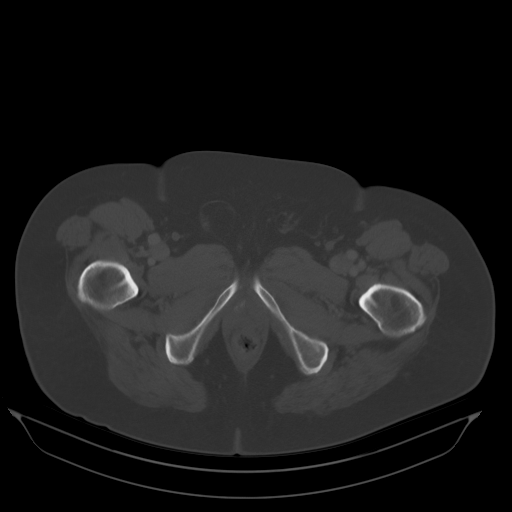
[im 14/93  soft-tissue]
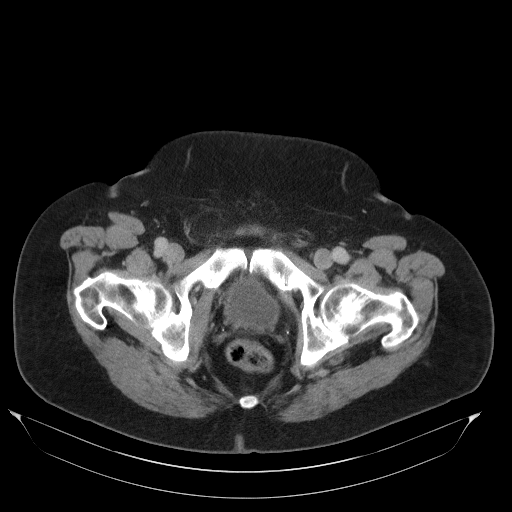
[im 20/93  soft-tissue]
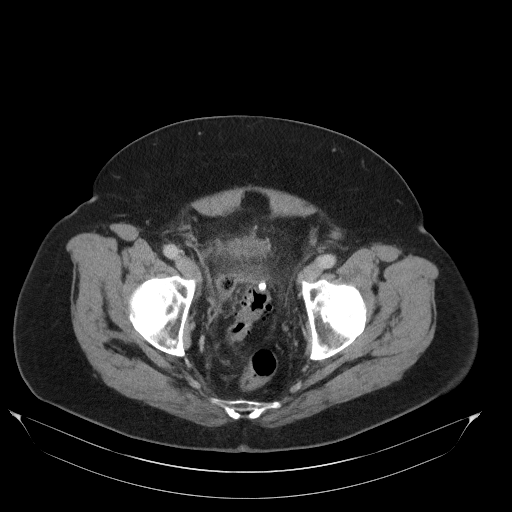
[im 33/93  soft-tissue]
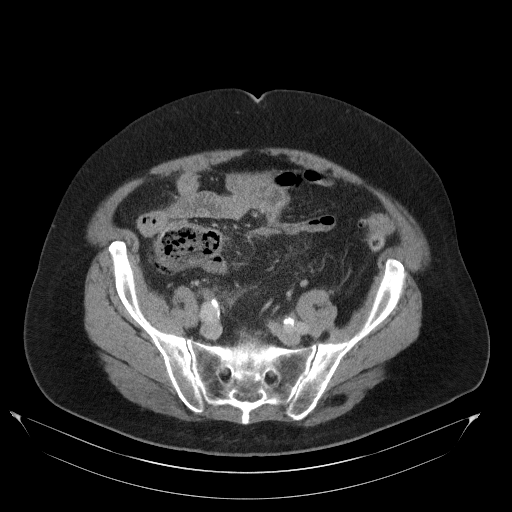
[im 40/93  soft-tissue]
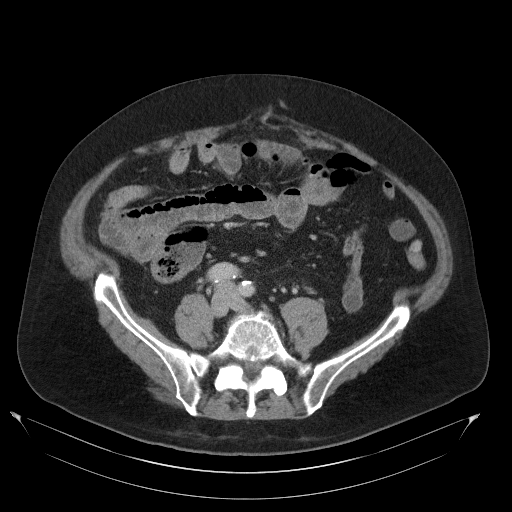
[im 47/93  soft-tissue]
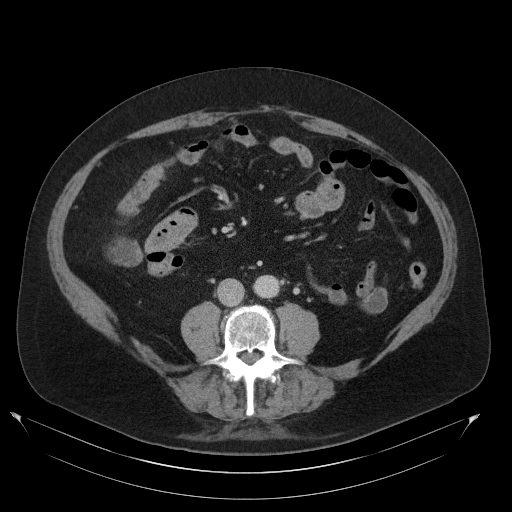
[im 53/93  soft-tissue]
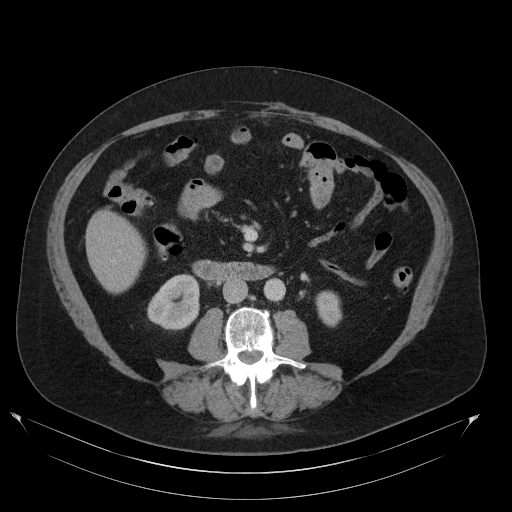
[im 60/93  soft-tissue]
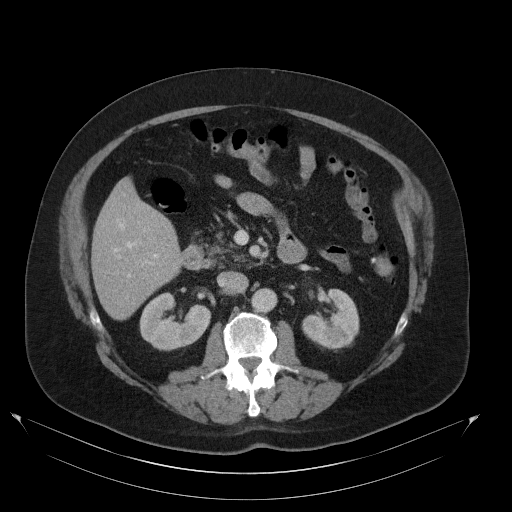
[im 66/93  lung]
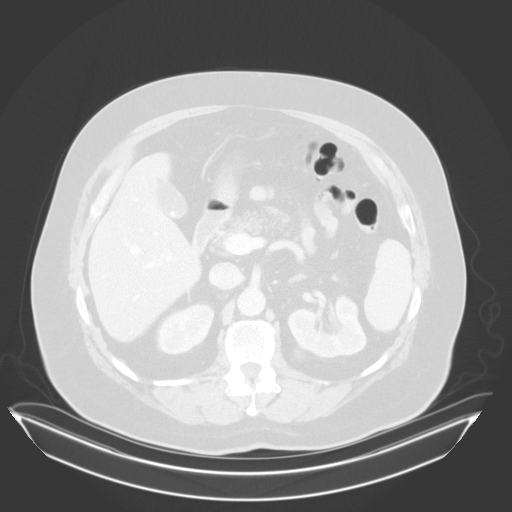
[im 73/93  soft-tissue]
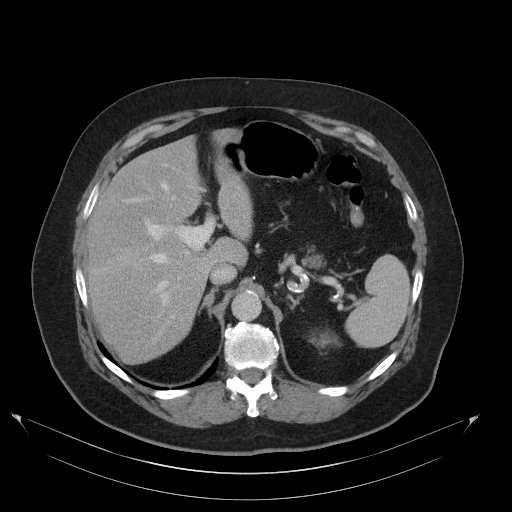
[im 73/93  lung]
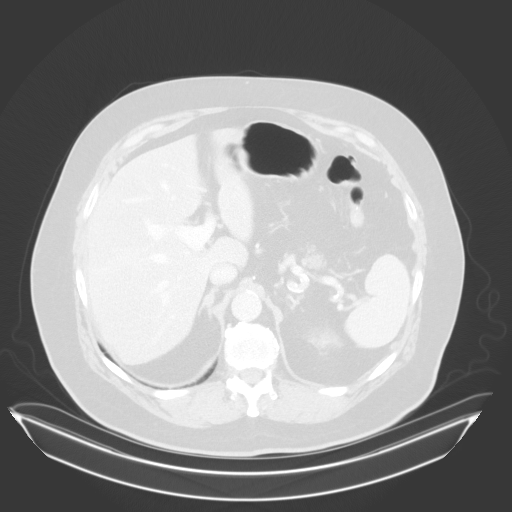
[im 73/93  bone]
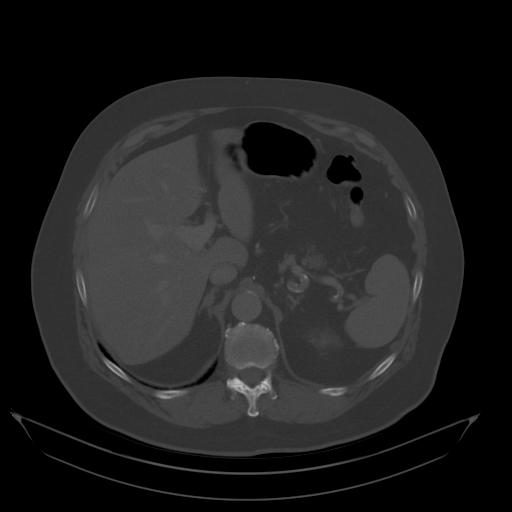
[im 79/93  soft-tissue]
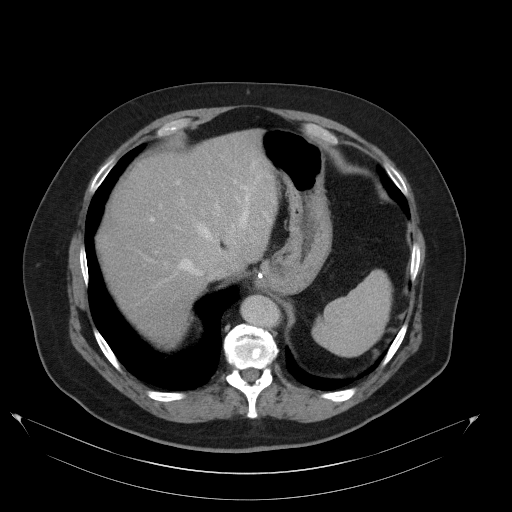
[im 79/93  lung]
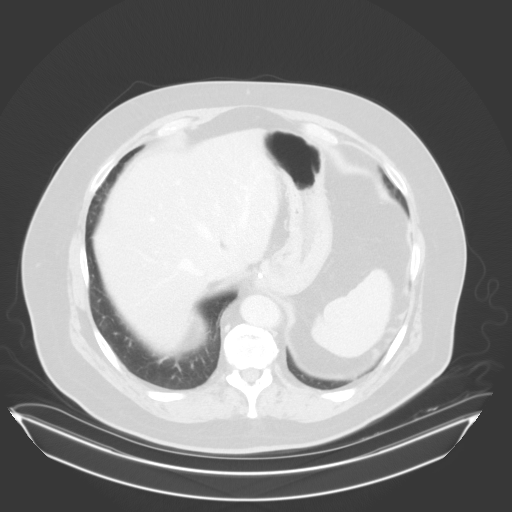
[im 86/93  soft-tissue]
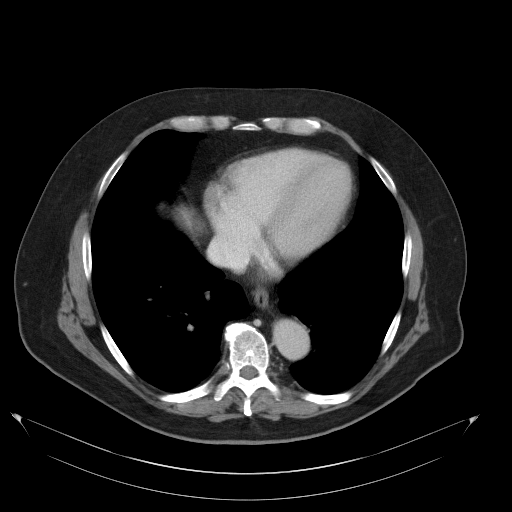
[im 86/93  lung]
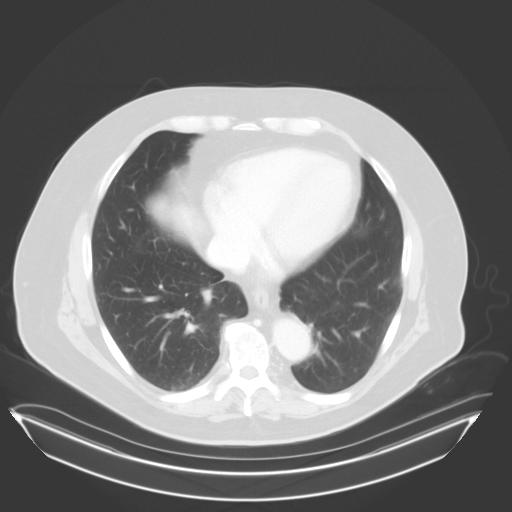

[12 of 32 positions shown; findings below may reference images not displayed]

FINDINGS: Lower chest: 2 mm subpleural pulmonary nodule in the right lower
lobe, unchanged compared to the prior examination from 6134,
considered definitively benign (presumably a subpleural lymph node).

Hepatobiliary: No suspicious cystic or solid hepatic lesions. No
intra or extrahepatic biliary ductal dilatation. Tiny calcified
gallstones lying dependently in the gallbladder. No findings to
suggest an acute cholecystitis at this time.

Pancreas: No pancreatic mass. No pancreatic ductal dilatation. No
pancreatic or peripancreatic fluid or inflammatory changes.

Spleen: Unremarkable.

Adrenals/Urinary Tract: 2.4 cm exophytic low-attenuation
nonenhancing lesion in the upper pole of the left kidney, compatible
with a simple cyst. Subcentimeter low-attenuation lesion in the
upper pole the left kidney, too small to definitively characterize,
but statistically also likely a tiny cyst. Right kidney and
bilateral adrenal glands are normal in appearance. No
hydroureteronephrosis. Urinary bladder is unremarkable in
appearance.

Stomach/Bowel: Surgical clips near the gastroesophageal junction.
Stomach is otherwise normal in appearance. No pathologic dilatation
of small bowel or colon. There is extensive colonic diverticulosis,
most severe in the descending colon and sigmoid colon. In the mid
sigmoid colon there are extensive surrounding inflammatory changes,
indicative of an acute diverticulitis. In this region, there is at
least one extraluminal fluid and gas collection, best appreciated on
axial image 70 of series 2 and coronal image 57 of series 4, where
this measures 2.8 x 4.7 x 2.5 cm. Immediately inferior to this there
is a smaller extraluminal collection (axial image 73 of series 2 and
coronal image 66 of series 4) measuring 1.7 x 2.4 x 2.2 cm which
appears to contain some feculent material. Surrounding these two
collections are extensive inflammatory changes in the associated
mesocolon and adjacent small mild mesentery, as well as a small
volume of fluid. Some of this fluid demonstrates rim enhancement
(axial image 67 of series 2), which could simply reflect underlying
peritonitis, or could indicate additional developing abscess(es).
The appendix is not confidently identified.

Vascular/Lymphatic: Aortic atherosclerosis, with mild aneurysmal
dilatation of the right common iliac artery which measures up to 17
mm in diameter. No lymphadenopathy identified in the abdomen or
pelvis.

Reproductive: Status post radical prostatectomy.

Other: Trace amount of free fluid in the low anatomic pelvis with
enhancement of peritoneal membranes, indicative of peritonitis.
Given the presence of extraluminal fluid and gas throughout the low
anatomic pelvis, findings are concerning for perforated
diverticulitis. Small right inguinal hernia containing only fat.

Musculoskeletal: There are no aggressive appearing lytic or blastic
lesions noted in the visualized portions of the skeleton.
IMPRESSION: 1. Severe acute diverticulitis of the sigmoid colon, with evidence
of perforation including what appear to be several small abscesses
in the low anatomic pelvis as well as extensive peritonitis.
Emergent surgical consultation is strongly recommended.
2. Cholelithiasis without evidence of acute cholecystitis at this
time.
3. Aortic atherosclerosis with mild aneurysmal dilatation of the
right common iliac artery which measures up to 17 mm in diameter.
4. Additional incidental findings, as above.

Critical Value/emergent results were called by telephone at the time
of interpretation on 05/03/2018 at [DATE] a.m. to Dr. ANTONIO CAMELO NAKAGAWA,
who verbally acknowledged these results. Per his direction, the
patient was instructed to go to the[HOSPITAL]Emergency
Department as soon as possible.

## 2019-06-16 ENCOUNTER — Encounter (HOSPITAL_BASED_OUTPATIENT_CLINIC_OR_DEPARTMENT_OTHER): Payer: Self-pay

## 2019-06-16 DIAGNOSIS — G4710 Hypersomnia, unspecified: Secondary | ICD-10-CM

## 2019-06-16 DIAGNOSIS — R0683 Snoring: Secondary | ICD-10-CM

## 2019-08-01 ENCOUNTER — Ambulatory Visit (HOSPITAL_BASED_OUTPATIENT_CLINIC_OR_DEPARTMENT_OTHER): Payer: Medicare Other

## 2019-08-01 ENCOUNTER — Other Ambulatory Visit: Payer: Self-pay

## 2019-10-03 ENCOUNTER — Other Ambulatory Visit: Payer: Self-pay

## 2019-10-03 ENCOUNTER — Ambulatory Visit (HOSPITAL_BASED_OUTPATIENT_CLINIC_OR_DEPARTMENT_OTHER): Payer: Medicare Other | Attending: Family Medicine | Admitting: Internal Medicine

## 2019-10-03 DIAGNOSIS — G4710 Hypersomnia, unspecified: Secondary | ICD-10-CM | POA: Insufficient documentation

## 2019-10-03 DIAGNOSIS — R0683 Snoring: Secondary | ICD-10-CM | POA: Diagnosis present

## 2019-10-03 DIAGNOSIS — G4736 Sleep related hypoventilation in conditions classified elsewhere: Secondary | ICD-10-CM | POA: Insufficient documentation

## 2019-10-03 DIAGNOSIS — G4733 Obstructive sleep apnea (adult) (pediatric): Secondary | ICD-10-CM | POA: Insufficient documentation

## 2019-10-04 ENCOUNTER — Ambulatory Visit: Payer: Medicare Other

## 2019-10-08 ENCOUNTER — Ambulatory Visit: Payer: Medicare Other

## 2019-10-11 DIAGNOSIS — R0683 Snoring: Secondary | ICD-10-CM

## 2019-10-11 NOTE — Procedures (Signed)
   Patient Name: Troy [name], Troy [name] Date: 10/04/2019 Gender: Male D.O.B: 1946-02-18 Age (years): 73 Referring Provider: Derinda Late Height (inches): 42 Interpreting Physician: Baird Lyons MD, ABSM Weight (lbs): 245 RPSGT: Jacolyn Reedy BMI: 36 MRN: [id number] Neck Size: 18.00  CLINICAL INFORMATION Sleep Study Type: HST Indication for sleep study: Excessive Daytime Sleepiness, Snoring Epworth Sleepiness Score: 18  SLEEP STUDY TECHNIQUE A multi-channel overnight portable sleep study was performed. The channels recorded were: nasal airflow, thoracic respiratory movement, and oxygen saturation with a pulse oximetry. Snoring was also monitored.  MEDICATIONS Patient self administered medications include: none reported.  SLEEP ARCHITECTURE Patient was studied for 364.7 minutes. The sleep efficiency was 96.4 % and the patient was supine for 85.5%. The arousal index was 0.0 per hour.  RESPIRATORY PARAMETERS The overall AHI was 51.3 per hour, with a central apnea index of 0.0 per hour. The oxygen nadir was 68% during sleep.  CARDIAC DATA Mean heart rate during sleep was 56.4 bpm.  IMPRESSIONS - Severe obstructive sleep apnea occurred during this study (AHI = 51.3/h). - No significant central sleep apnea occurred during this study (CAI = 0.0/h). - Oxygen desaturation was noted during this study (Min O2 = 68%). Mean sat 88%. - Patient snored.  DIAGNOSIS - Obstructive Sleep Apnea (327.23 [G47.33 ICD-10]) - Nocturnal Hypoxemia (327.26 [G47.36 ICD-10])  RECOMMENDATIONS - Suggest CPAP titration sleep study or autopap. Other options would be based on clinical judgment. - Be careful with alcohol, sedatives and other CNS depressants that may worsen sleep apnea and disrupt normal sleep architecture. - Sleep hygiene should be reviewed to assess factors that may improve sleep quality. - Weight management and regular exercise should be initiated or continued.  [Electronically  signed] 10/11/2019 12:48 PM  Baird Lyons MD, [place], American Board of Sleep Medicine   NPI: [id number]                          [facility]  ELECTRONICALLY SIGNED ON:  10/11/2019, 12:45 PM [place] PH: [phone]   FX: [phone] [place]

## 2019-10-17 ENCOUNTER — Ambulatory Visit: Payer: Medicare Other | Attending: Internal Medicine

## 2019-11-04 ENCOUNTER — Ambulatory Visit: Payer: Medicare Other | Admitting: Pulmonary Disease

## 2019-11-05 ENCOUNTER — Other Ambulatory Visit: Payer: Self-pay

## 2019-11-05 ENCOUNTER — Encounter: Payer: Self-pay | Admitting: Pulmonary Disease

## 2019-11-05 ENCOUNTER — Ambulatory Visit: Payer: Medicare Other | Admitting: Pulmonary Disease

## 2019-11-05 VITALS — BP 136/70 | HR 62 | Temp 97.6°F | Ht 68.75 in | Wt 259.8 lb

## 2019-11-05 DIAGNOSIS — G4733 Obstructive sleep apnea (adult) (pediatric): Secondary | ICD-10-CM

## 2019-11-05 DIAGNOSIS — G4730 Sleep apnea, unspecified: Secondary | ICD-10-CM

## 2019-11-05 DIAGNOSIS — E669 Obesity, unspecified: Secondary | ICD-10-CM | POA: Diagnosis not present

## 2019-11-05 DIAGNOSIS — G4734 Idiopathic sleep related nonobstructive alveolar hypoventilation: Secondary | ICD-10-CM | POA: Diagnosis not present

## 2019-11-05 NOTE — Progress Notes (Signed)
[facility]  Chief Complaint  Patient presents with  . Consult    Sleep apnea    Constitutional:  BP 136/70 (BP Location: Left Arm, Patient Position: Sitting, Cuff Size: Large)   Pulse 62   Temp 97.6 F (36.4 C)   Ht 5' 8.75" (1.746 m)   Wt 259 lb 12.8 oz (117.8 kg)   SpO2 95% Comment: on room air  BMI 38.65 kg/m   Past Medical History:  Coronary atherosclerosis, HTN, GERD, HH s/p Nissen fundoplication, Prostate cancer 2009 s/p prostatectomy, ED, Diverticulitis, Lumbar spinal stenosis, OA, Anxiety, Insomnia, Gout, Gallstones  Brief Summary:  ABDIRAHIM KICHLINE is a 74 y.o. male with obstructive sleep apnea.  He works as a Pharmacist, community.  He was seen by PCP recently and had screening question for sleep apnea.  Based on this he had a home sleep study which showed severe obstructive sleep apnea with oxygen desaturation.  He has snored for years.  He uses bathroom frequently at night.  Not history of bruxism or restless legs.    He doesn't use anything to help stay awake.  Occasionally uses melatonin or xanax to help sleep.  Had tonsils out as a child.  He thinks his father had sleep apnea, but never tested.  Epworth score is 20 out of 24.   Physical Exam:   Appearance - well kempt   ENMT - clear nasal mucosa, midline nasal  septum, no oral exudates, no LAN, trachea midline, MP 3, poor dentition  Respiratory - normal chest wall, normal respiratory effort, no accessory muscle use, no wheeze/rales  CV - s1s2 regular rate and rhythm, no murmurs, no peripheral edema, radial pulses symmetric  GI - soft, non tender, no masses  Lymph - no adenopathy noted in neck and axillary areas  MSK - normal gait  Ext - no cyanosis, clubbing, or joint inflammation noted  Skin - no rashes, lesions, or ulcers  Neuro - normal strength, oriented x 3  Psych - normal mood and affect  Discussion:  He has snoring, sleep disruption, apnea, and daytime  sleepiness.  He has history of hypertension and anxiety.  He reports frequent episodes of nocturia.  His recent home sleep study showed severe obstructive sleep apnea with oxygen desaturation.    Assessment/Plan:   Snoring with excessive daytime sleepiness from obstructive sleep apnea. - reviewed his sleep study results with him - various therapies for treatment were reviewed: CPAP, oral appliance, and surgical interventions - will arrange for auto CPAP set up first and get overnight oximetry on auto CPAP - depending on his response will determine if he needs to have in lab titration study - discussed techniques to improve mask fit and compliance with CPAP - reviewed cleaning options for CPAP set up - discussed pros/cons of purchasing CPAP machine and supplies on his own versus running through insurance  Obesity. - discussed how weight can impact sleep and risk for sleep disordered breathing - discussed options to assist with weight loss: combination of diet modification, cardiovascular and strength training exercises  Cardiovascular risk. - had an extensive discussion regarding the adverse health consequences related to untreated sleep disordered breathing - specifically discussed the risks for hypertension, coronary artery disease, cardiac dysrhythmias, cerebrovascular disease, and diabetes - lifestyle modification discussed  Safe driving practices. - discussed how sleep disruption can increase risk of accidents, particularly when driving - safe driving practices were discussed   Patient Instructions  Will arrange for auto CPAP set up  and overnight oximetry with CPAP  Follow up in 2 months   Time spent 47 minutes  Chesley Mires, MD [facility] Pager: [phone] 11/05/2019, 3:19 PM  Flow Sheet     Pulmonary tests:  Spirometry 05/16/19 >> FEV1 2.22 (83%), FEV1% 65  Sleep tests:  HST 10/04/19 >> AHI 51.3, SpO2 low 68%  Medications:   Allergies as of  11/05/2019      Reactions   Ciprofloxacin Diarrhea, Nausea And Vomiting   Can take Levaquin      Medication List       Accurate as of November 05, 2019  3:19 PM. If you have any questions, ask your nurse or doctor.        STOP taking these medications   acetaminophen 500 MG tablet Commonly known as: TYLENOL Stopped by: Chesley Mires, MD   amLODipine 5 MG tablet Commonly known as: NORVASC Stopped by: Chesley Mires, MD   hydrochlorothiazide 12.5 MG capsule Commonly known as: MICROZIDE Stopped by: Chesley Mires, MD   Krill Oil 500 MG Caps Stopped by: Chesley Mires, MD   traMADol 50 MG tablet Commonly known as: ULTRAM Stopped by: Chesley Mires, MD   TURMERIC PO Stopped by: Chesley Mires, MD     TAKE these medications   ALPRAZolam 0.5 MG tablet Commonly known as: XANAX Take 0.5 mg by mouth at bedtime as needed for anxiety. For sleep.   ezetimibe 10 MG tablet Commonly known as: ZETIA Take 10 mg by mouth at bedtime.   losartan 100 MG tablet Commonly known as: COZAAR Take 100 mg by mouth every morning.   LUTEIN PO Take by mouth.   Melatonin 10 MG Tabs Take by mouth daily.   metoprolol succinate 50 MG 24 hr tablet Commonly known as: TOPROL-XL Take 50 mg by mouth daily. Take with or immediately following a meal.   nitroGLYCERIN 0.2 mg/hr patch Commonly known as: NITRODUR - Dosed in mg/24 hr Place 1 patch (0.2 mg total) onto the skin daily.   rosuvastatin 20 MG tablet Commonly known as: CRESTOR Take 20 mg by mouth at bedtime.   Vitamin D3 50 MCG (2000 UT) Tabs Take 1 tablet by mouth every morning.   zinc gluconate 50 MG tablet Take 50 mg by mouth daily.       Past Surgical History:  He  has a past surgical history that includes Hernia repair (September 01, 2010); Incontinence surgery (Jan 31, 2011); Prostatectomy (reb 9, 2010); Ventral hernia repair (09/06/2011); Ventral hernia repair (09/06/2011); Tonsillectomy; Laparoscopic Nissen fundoplication (N/A,  0000000); Upper gi endoscopy (N/A, 03/10/2015); Insertion of mesh (03/10/2015); and IR Radiologist Eval & Mgmt (05/15/2018).  Family History:  His family history includes Bladder Cancer in his father; Cervical cancer in his sister; Coronary artery disease in his mother; Dementia in his father; Diabetes in his father; Lung cancer in his mother; Stroke in his father.  Social History:  He  reports that he has never smoked. He has never used smokeless tobacco. He reports current alcohol use. He reports that he does not use drugs.

## 2019-11-05 NOTE — Patient Instructions (Signed)
Will arrange for auto CPAP set up and overnight oximetry with CPAP  Follow up in 2 months

## 2019-12-08 ENCOUNTER — Telehealth: Payer: Self-pay | Admitting: Pulmonary Disease

## 2019-12-08 NOTE — Telephone Encounter (Signed)
Received results from patient's ONO on room air from 12/04/19 from [place]. Patient has an appt with VS on 01/02/20 at [facility]. Will place results in VS' review folder.

## 2019-12-22 ENCOUNTER — Telehealth: Payer: Self-pay | Admitting: Pulmonary Disease

## 2019-12-22 NOTE — Telephone Encounter (Signed)
ONO with RA 12/05/19 >> test time 9 hrs 40 min.  Baseline SpO2 90%, low SpO2 79%.  Spent 2 hrs 36 min with SpO2 < 88%.   Please tell the DME company that performed this test that they messed up the order.  The order from 11/05/19 states that the ONO was to be performed WITH CPAP and NOT on room air.  They need to repeat this test as he order stated from 11/05/19, and should void the charge for test performed on 12/05/19.

## 2019-12-26 NOTE — Telephone Encounter (Signed)
Called [employer] and spoke with [place]. She confirmed that the ONO was done on room air instead of on cpap. She stated that she will work to get the ONO repeated correctly and will reach out to the patient. She apologizes for the miscommunication.   Nothing further needed at time of call.

## 2019-12-30 ENCOUNTER — Telehealth: Payer: Self-pay | Admitting: Pulmonary Disease

## 2019-12-30 NOTE — Telephone Encounter (Signed)
Email brought to me by Rodena Piety, [facility] which was sent to her by Jeneen Rinks with [employer] from [place].  This is in regards to the ONO pt recently had done.  Primitivo Gauze stated that the ONO was ordered for ONO on CPAP. Primitivo Gauze stated that she verified with pt and pt stated that the ONO was done on his CPAP. In [employer], the results say that the ONO was done on Room Air but the order as well as Cassidy's notes state that it was done on CPAP.  Primitivo Gauze stated that this was a mistake as she entered in Psychologist, forensic.  Primitivo Gauze and Jeneen Rinks with [employer] are wanting to know what VS wants them to do since pt did the ONO correctly on his CPAP. Primitivo Gauze stated that she could contact [employer] and see if they could adjust the results stating that it was done on pt's CPAP.  Dr. Halford Chessman, please advise what you want Primitivo Gauze with [employer] to do.

## 2019-12-31 NOTE — Telephone Encounter (Signed)
ONO with CPAP 12/05/19 >> test time 9 hrs 40 min.  Baseline SpO2 90%, low SpO2 79%.  Spent 2 hrs 36 min with SpO2 < 88%.   Please let pt know his overnight oxygen test shows low oxygen in spite of using CPAP.  He needs to have in lab CPAP titration study to determine if he needs supplemental oxygen with CPAP at night.

## 2020-01-01 NOTE — Telephone Encounter (Signed)
Left message for patient to call back.   Patient is scheduled for an OV tomorrow with Dr. Halford Chessman.

## 2020-01-02 ENCOUNTER — Encounter: Payer: Self-pay | Admitting: Pulmonary Disease

## 2020-01-02 ENCOUNTER — Other Ambulatory Visit: Payer: Self-pay

## 2020-01-02 ENCOUNTER — Ambulatory Visit: Payer: Medicare Other | Admitting: Pulmonary Disease

## 2020-01-02 VITALS — BP 138/72 | HR 57 | Temp 97.6°F | Ht 70.0 in | Wt 262.0 lb

## 2020-01-02 DIAGNOSIS — E669 Obesity, unspecified: Secondary | ICD-10-CM | POA: Diagnosis not present

## 2020-01-02 DIAGNOSIS — G4733 Obstructive sleep apnea (adult) (pediatric): Secondary | ICD-10-CM

## 2020-01-02 DIAGNOSIS — G4730 Sleep apnea, unspecified: Secondary | ICD-10-CM

## 2020-01-02 NOTE — Patient Instructions (Signed)
Follow up in 1 year.

## 2020-01-02 NOTE — Progress Notes (Signed)
[facility]  Chief Complaint  Patient presents with  . 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  Subjective:   He has been sleeping better since starting CPAP.  Not using bathroom as much at night.  Has hybrid mask.  Gets occasional mask leak.  Has dry nose and mouth in the morning, but mild.   Physical Exam:   Appearance - well kempt   ENMT - no sinus tenderness, no oral exudate, no LAN, Mallampati 3 airway, no stridor  Respiratory - equal breath sounds bilaterally, no wheezing or rales  CV - s1s2 regular rate and rhythm, no murmurs  Ext - no clubbing, no edema  Skin - no rashes  Psych - normal mood and affect   Assessment/Plan:   Obstructive sleep apnea. - he is compliant with CPAP and reports benefit from therapy - continue auto CPAP - discussed options to help with mask fit  Obesity. - importance of weight loss efforts has been reviewed with him  A total of  22 minutes spent addressing patient care issues on day of visit.   Follow up:   Patient Instructions  Follow up in 1 year   Signature:  Chesley Mires, MD [place] Pager: [phone] 01/02/2020, 9:59 AM  Flow Sheet     Pulmonary tests:  Spirometry 05/16/19 >> FEV1 2.22 (83%), FEV1% 65  Sleep tests:  HST 10/04/19 >> AHI 51.3, SpO2 low 68% Auto CPAP 12/03/19 to 01/01/20 >> used on 30 of 30 nights with average 7 hrs 37 min.  Average  AHI 10 with median CPAP 10 and 95 th percentile CPAP 13 cm H2O.  Air leak.  Medications:   Allergies as of 01/02/2020      Reactions   Ciprofloxacin Diarrhea, Nausea And Vomiting   Can take Levaquin      Medication List       Accurate as of January 02, 2020  9:59 AM. If you have any questions, ask your nurse or doctor.        STOP taking these medications   nitroGLYCERIN 0.2 mg/hr patch Commonly known as: NITRODUR - Dosed in mg/24 hr Stopped by: Chesley Mires, MD     TAKE these medications   ALPRAZolam 0.5 MG tablet Commonly known as: XANAX Take 0.5 mg by mouth at bedtime as needed for anxiety. For sleep.   ezetimibe 10 MG tablet Commonly known as: ZETIA Take 10 mg by mouth at bedtime.   losartan 100 MG tablet Commonly known as: COZAAR Take 100 mg by mouth every morning.   LUTEIN PO Take by mouth.   Melatonin 10 MG Tabs Take by mouth daily.   metoprolol succinate 50 MG 24 hr tablet Commonly known as: TOPROL-XL Take 50 mg by mouth daily. Take with or immediately following a meal.   rosuvastatin 20 MG tablet Commonly known as: CRESTOR Take 20 mg by mouth at bedtime.  Vitamin D3 50 MCG (2000 UT) Tabs Take 1 tablet by mouth every morning.   zinc gluconate 50 MG tablet Take 50 mg by mouth daily.       Past Surgical History:  He  has a past surgical history that includes Hernia repair (September 01, 2010); Incontinence surgery (Jan 31, 2011); Prostatectomy (reb 9, 2010); Ventral hernia repair (09/06/2011); Ventral hernia repair (09/06/2011); Tonsillectomy; Laparoscopic Nissen fundoplication (N/A, 0000000); Upper gi endoscopy (N/A, 03/10/2015); Insertion of mesh (03/10/2015); and IR Radiologist Eval & Mgmt (05/15/2018).  Family History:  His family history includes Bladder Cancer in his father; Cervical cancer in his sister; Coronary artery disease in his mother; Dementia in his father; Diabetes in his father; Lung cancer in his mother; Stroke in his father.  Social  History:  He  reports that he has never smoked. He has never used smokeless tobacco. He reports current alcohol use. He reports that he does not use drugs.

## 2020-03-29 ENCOUNTER — Telehealth: Payer: Self-pay | Admitting: Orthopaedic Surgery

## 2020-03-29 NOTE — Telephone Encounter (Signed)
As long as not diabetic

## 2020-03-29 NOTE — Telephone Encounter (Signed)
Pt called stating he has an appt on 04/07/20 for a cortisone Knee injection and wanted to know if he could get injections in both knees?

## 2020-03-29 NOTE — Telephone Encounter (Signed)
Called patient. He was unavailable. Just need to advise on message below.

## 2020-03-30 ENCOUNTER — Telehealth: Payer: Self-pay | Admitting: Orthopaedic Surgery

## 2020-03-30 NOTE — Telephone Encounter (Signed)
Spoke with patient  Read note from Mendel Ryder to him concerning getting injections in both knees    Patient advised he is not diabetic

## 2020-03-30 NOTE — Telephone Encounter (Signed)
They advised on previous message

## 2020-04-07 ENCOUNTER — Encounter: Payer: Self-pay | Admitting: Orthopaedic Surgery

## 2020-04-07 ENCOUNTER — Ambulatory Visit: Payer: Medicare Other | Admitting: Orthopaedic Surgery

## 2020-04-07 VITALS — Ht 69.0 in | Wt 250.0 lb

## 2020-04-07 DIAGNOSIS — M1712 Unilateral primary osteoarthritis, left knee: Secondary | ICD-10-CM

## 2020-04-07 MED ORDER — METHYLPREDNISOLONE ACETATE 40 MG/ML IJ SUSP
40.0000 mg | INTRAMUSCULAR | Status: AC | PRN
  Administered 2020-04-07: 40 mg via INTRA_ARTICULAR

## 2020-04-07 MED ORDER — LIDOCAINE HCL 1 % IJ SOLN
2.0000 mL | INTRAMUSCULAR | Status: AC | PRN
  Administered 2020-04-07: 2 mL

## 2020-04-07 MED ORDER — BUPIVACAINE HCL 0.5 % IJ SOLN
2.0000 mL | INTRAMUSCULAR | Status: AC | PRN
  Administered 2020-04-07: 2 mL via INTRA_ARTICULAR

## 2020-04-07 NOTE — Progress Notes (Signed)
Office Visit Note   Patient: Troy [name]           Date of Birth: 1946/08/04           MRN: [id number] Visit Date: 04/07/2020              Requested by: Derinda Late, MD [address] [place],  [place] [address] PCP: Derinda Late, MD   Assessment & Plan: Visit Diagnoses:  1. Primary osteoarthritis of left knee     Plan: We repeated the left knee injection today.  Patient tolerated this well.  We will see him back as needed.  Follow-Up Instructions: Return if symptoms worsen or fail to improve.   Orders:  No orders of the defined types were placed in this encounter.  No orders of the defined types were placed in this encounter.     Procedures: Large Joint Inj: L knee on 04/07/2020 1:28 PM Details: 22 G needle Medications: 2 mL bupivacaine 0.5 %; 2 mL lidocaine 1 %; 40 mg methylPREDNISolone acetate 40 MG/ML Outcome: tolerated well, no immediate complications Patient was prepped and draped in the usual sterile fashion.       Clinical Data: No additional findings.   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  We saw him about 2 years ago and he had a cortisone injection which gave him really good relief until recently.  He is open to repeat the injection today.  Denies any medical changes.   Review of Systems   Objective: Vital Signs: Ht 5\' 9"  (1.753 m)    Wt (!) 250 lb (113.4 kg)    BMI 36.92 kg/m   Physical Exam  Ortho Exam Left knee shows no joint effusion.  Moderate crepitus with range of motion. Specialty Comments:  No specialty comments available.  Imaging: No results found.   PMFS History: Patient Active Problem List   Diagnosis Date Noted   Diverticulitis 05/03/2018   Status post laparoscopic Nissen fundoplication and repair of large hiatus hernia June 2016 03/10/2015    Ventral hernia-s/p repair x 2 09/07/2011   History of prostate cancer-s/p prostatectomy 09/07/2011   Past Medical History:  Diagnosis Date   Abdominal distention    hernia   Anemia    Arthritis    big toes    Cancer ([place])    prostate    GERD (gastroesophageal reflux disease)    Hypertension    Lumbar herniated disc    L5   Rash    yeast infection from bladder sling surgery    Sciatica of right side 09-01-11   right leg-tx. Tylenol    Family History  Problem Relation Age of Onset   Lung cancer Mother    Coronary artery disease Mother    Stroke Father    Diabetes Father    Bladder Cancer Father    Dementia Father    Cervical cancer Sister     Past Surgical History:  Procedure Laterality Date   HERNIA REPAIR  September 01, 2010   INCONTINENCE SURGERY  Jan 31, 2011   INSERTION OF MESH  03/10/2015   Procedure: INSERTION OF MESH;  Surgeon: Johnathan Hausen, MD;  Location: [facility];  Service: General;;   IR RADIOLOGIST EVAL & MGMT  05/15/2018   LAPAROSCOPIC NISSEN FUNDOPLICATION N/A 06/01/1940  Procedure: LAPAROSCOPIC NISSEN FUNDOPLICATION AND HIATAL HERNIA REPAIR;  Surgeon: Johnathan Hausen, MD;  Location: [facility];  Service: General;  Laterality: N/A;   PROSTATECTOMY  reb 9, 2010   TONSILLECTOMY     UPPER GI ENDOSCOPY N/A 03/10/2015   Procedure: UPPER GI ENDOSCOPY;  Surgeon: Johnathan Hausen, MD;  Location: [facility];  Service: General;  Laterality: N/A;   VENTRAL HERNIA REPAIR  09/06/2011   Procedure: HERNIA REPAIR VENTRAL ADULT;  Surgeon: Pedro Earls, MD;  Location: [facility];  Service: General;  Laterality: N/A;   VENTRAL HERNIA REPAIR  09/06/2011   Procedure: LAPAROSCOPIC VENTRAL HERNIA;  Surgeon: Pedro Earls, MD;  Location: [facility];  Service: General;  Laterality: N/A;   Social History   Occupational History   Not on file  Tobacco Use   Smoking status: Never Smoker   Smokeless tobacco: Never Used  Substance and Sexual Activity   Alcohol use:  Yes    Comment: occ. alcohol   Drug use: No   Sexual activity: Yes

## 2020-04-23 ENCOUNTER — Ambulatory Visit: Payer: Medicare Other | Admitting: Orthopaedic Surgery

## 2020-05-05 ENCOUNTER — Ambulatory Visit: Payer: Self-pay

## 2020-05-05 ENCOUNTER — Encounter: Payer: Self-pay | Admitting: Orthopaedic Surgery

## 2020-05-05 ENCOUNTER — Ambulatory Visit: Payer: Medicare Other | Admitting: Orthopaedic Surgery

## 2020-05-05 DIAGNOSIS — M19012 Primary osteoarthritis, left shoulder: Secondary | ICD-10-CM

## 2020-05-05 NOTE — Progress Notes (Signed)
Office Visit Note   Patient: Troy [name]           Date of Birth: [birth year]           MRN: [id number] Visit Date: 05/05/2020              Requested by: Derinda Late, MD [address] [place],  [place] [address] PCP: Derinda Late, MD   Assessment & Plan: Visit Diagnoses:  1. Primary osteoarthritis of left shoulder     Plan: Impression is advanced left shoulder glenohumeral osteoarthritis.  Based on his options he would like to try cortisone injection today.  This was done by Dr. Junius Roads under ultrasound guidance.  I have also made a referral to outpatient physical therapy to hopefully improve his strength and range of motion.  He understands that the definitive treatment would be a shoulder replacement.  Follow-up as needed.  Follow-Up Instructions: Return if symptoms worsen or fail to improve.   Orders:  Orders Placed This Encounter  Procedures  . XR Shoulder Left  . US Guided Needle Placement - No Linked Charges  . Ambulatory referral to Physical Therapy   No orders of the defined types were placed in this encounter.     Procedures: No procedures performed   Clinical Data: No additional findings.   Subjective: Chief Complaint  Patient presents with  . 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  He states that he is getting worsening limitation range of motion and decreased strength due to favoring it due to the pain.  He has tried over-the-counter NSAIDs without significant relief.  He is interested in doing cortisone injection per our previous conversation.   Review of Systems  Constitutional: Negative.   All other systems reviewed and are negative.    Objective: Vital Signs: There were no vitals taken for this visit.  Physical Exam Vitals and nursing note reviewed.  Constitutional:      Appearance: He is  well-developed.  Pulmonary:     Effort: Pulmonary effort is normal.  Abdominal:     Palpations: Abdomen is soft.  Skin:    General: Skin is warm.  Neurological:     Mental Status: He is alert and oriented to person, place, and time.  Psychiatric:        Behavior: Behavior normal.        Thought Content: Thought content normal.        Judgment: Judgment normal.     Ortho Exam Left shoulder shows limited forward flexion and external rotation with moderate pain.  Neurovascular intact. Specialty Comments:  No specialty comments available.  Imaging: US Guided Needle Placement - No Linked Charges  Result Date: 05/05/2020 Ultrasound-guided left glenohumeral injection: After sterile prep with Betadine, injected 8 cc 1% lidocaine without epinephrine and 40 mg methylprednisolone using a 22-gauge spinal needle, passing the needle from posterior approach into the glenohumeral joint.  Injectate seen filling joint capsule.  Good pain relief, but no change in ROM.   XR Shoulder Left  Result Date: 05/05/2020 Advanced osteoarthritis of the glenohumeral joint.    PMFS History: Patient Active Problem List   Diagnosis Date Noted  . Primary osteoarthritis of left shoulder 05/05/2020  . Diverticulitis 05/03/2018  . Status post laparoscopic Nissen fundoplication and repair of large hiatus hernia June 2016 03/10/2015  . Ventral hernia-s/p repair x  2 09/07/2011  . History of prostate cancer-s/p prostatectomy 09/07/2011   Past Medical History:  Diagnosis Date  . Abdominal distention    hernia  . Anemia   . Arthritis    big toes   . Cancer [facility])    prostate   . GERD (gastroesophageal reflux disease)   . Hypertension   . Lumbar herniated disc    L5  . Rash    yeast infection from bladder sling surgery   . Sciatica of right side 09-01-11   right leg-tx. Tylenol    Family History  Problem Relation Age of Onset  . Lung cancer Mother   . Coronary artery disease Mother   . Stroke Father    . Diabetes Father   . Bladder Cancer Father   . Dementia Father   . Cervical cancer Sister     Past Surgical History:  Procedure Laterality Date  . HERNIA REPAIR  September 01, 2010  . INCONTINENCE SURGERY  Jan 31, 2011  . INSERTION OF MESH  03/10/2015   Procedure: INSERTION OF MESH;  Surgeon: Johnathan Hausen, MD;  Location: [facility];  Service: General;;  . IR RADIOLOGIST EVAL & MGMT  05/15/2018  . LAPAROSCOPIC NISSEN FUNDOPLICATION N/A 1/77/1165   Procedure: LAPAROSCOPIC NISSEN FUNDOPLICATION AND HIATAL HERNIA REPAIR;  Surgeon: Johnathan Hausen, MD;  Location: [facility];  Service: General;  Laterality: N/A;  . PROSTATECTOMY  reb 9, 2010  . TONSILLECTOMY    . UPPER GI ENDOSCOPY N/A 03/10/2015   Procedure: UPPER GI ENDOSCOPY;  Surgeon: Johnathan Hausen, MD;  Location: [facility];  Service: General;  Laterality: N/A;  . VENTRAL HERNIA REPAIR  09/06/2011   Procedure: HERNIA REPAIR VENTRAL ADULT;  Surgeon: Pedro Earls, MD;  [facility]: [facility];  Service: General;  Laterality: N/A;  . VENTRAL HERNIA REPAIR  09/06/2011   Procedure: LAPAROSCOPIC VENTRAL HERNIA;  Surgeon: Pedro Earls, MD;  Location: [facility];  Service: General;  Laterality: N/A;   Social History   Occupational History  . Not on file  Tobacco Use  . Smoking status: Never Smoker  . Smokeless tobacco: Never Used  Substance and Sexual Activity  . Alcohol use: Yes    Comment: occ. alcohol  . Drug use: No  . Sexual activity: Yes

## 2020-05-05 NOTE — Progress Notes (Signed)
Subjective: Patient is here for ultrasound-guided intra-articular left glenohumeral injection.  Pain and stiffness due to DJD.  Objective:  Limited active ROM.  Procedure: Ultrasound-guided left glenohumeral injection: After sterile prep with Betadine, injected 8 cc 1% lidocaine without epinephrine and 40 mg methylprednisolone using a 22-gauge spinal needle, passing the needle from posterior approach into the glenohumeral joint.  Injectate seen filling joint capsule.  Good pain relief, but no change in ROM.  PT referral to [facility] also given.

## 2020-05-07 ENCOUNTER — Ambulatory Visit (INDEPENDENT_AMBULATORY_CARE_PROVIDER_SITE_OTHER): Payer: Medicare Other | Admitting: Rehabilitative and Restorative Service Providers"

## 2020-05-07 ENCOUNTER — Encounter: Payer: Self-pay | Admitting: Rehabilitative and Restorative Service Providers"

## 2020-05-07 ENCOUNTER — Other Ambulatory Visit: Payer: Self-pay

## 2020-05-07 DIAGNOSIS — M25612 Stiffness of left shoulder, not elsewhere classified: Secondary | ICD-10-CM | POA: Diagnosis not present

## 2020-05-07 DIAGNOSIS — G8929 Other chronic pain: Secondary | ICD-10-CM

## 2020-05-07 DIAGNOSIS — M6281 Muscle weakness (generalized): Secondary | ICD-10-CM

## 2020-05-07 DIAGNOSIS — M25512 Pain in left shoulder: Secondary | ICD-10-CM

## 2020-05-07 NOTE — Therapy (Signed)
[place], [place], [address] Phone: [phone]   Fax:  [phone]  Physical Therapy Evaluation  Patient Details  Name: Troy [name] MRN: [id number] Date of Birth: 18-Aug-1946 Referring Provider (PT): Leandrew Koyanagi MD   Encounter Date: 05/07/2020   PT End of Session - 05/07/20 1452    Visit Number 1    Number of Visits 16    PT Start Time 0850    PT Stop Time 0930    PT Time Calculation (min) 40 min    Activity Tolerance Patient tolerated treatment well;No increased pain    Behavior During Therapy WFL for tasks assessed/performed           Past Medical History:  Diagnosis Date   Abdominal distention    hernia   Anemia    Arthritis    big toes    Cancer (HCC)    prostate    GERD (gastroesophageal reflux disease)    Hypertension    Lumbar herniated disc    L5   Rash    yeast infection from bladder sling surgery    Sciatica of right side 09-01-11   right leg-tx. Tylenol    Past Surgical History:  Procedure Laterality Date   HERNIA REPAIR  September 01, 2010   INCONTINENCE SURGERY  Jan 31, 2011   INSERTION OF MESH  03/10/2015   Procedure: INSERTION OF MESH;  Surgeon: Johnathan Hausen, MD;  Location: [facility];  Service: General;;   IR RADIOLOGIST EVAL & MGMT  05/15/2018   LAPAROSCOPIC NISSEN FUNDOPLICATION N/A 3/76/2831   Procedure: LAPAROSCOPIC NISSEN FUNDOPLICATION AND HIATAL HERNIA REPAIR;  Surgeon: Johnathan Hausen, MD;  Location: [facility];  Service: General;  Laterality: N/A;   PROSTATECTOMY  reb 9, 2010   TONSILLECTOMY     UPPER GI ENDOSCOPY N/A 03/10/2015   Procedure: UPPER GI ENDOSCOPY;  Surgeon: Johnathan Hausen, MD;  [facility]: [facility];  Service: General;  Laterality: N/A;   VENTRAL HERNIA REPAIR  09/06/2011   Procedure: HERNIA REPAIR VENTRAL ADULT;  Surgeon: Pedro Earls, MD;  [facility]: [facility];  Service: General;  Laterality: N/A;   VENTRAL HERNIA REPAIR  09/06/2011   Procedure:  LAPAROSCOPIC VENTRAL HERNIA;  Surgeon: Pedro Earls, MD;  Location: [facility];  Service: General;  Laterality: N/A;    There were no vitals filed for this visit.    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  Pain and stiffness limit his ability to complete many basic ADLs involving reaching or overhead function.    Limitations Lifting    Patient Stated Goals Improve pain-free AROM and strength of L shoulder.    Currently in Pain? Yes    Pain Score 3     Pain Location Shoulder    Pain Orientation Left    Pain Descriptors / Indicators Sore;Aching    Pain Type Chronic pain    Pain Onset More than a month ago    Pain Frequency Constant    Aggravating Factors  Overuse    Pain Relieving Factors Recent cortisone shot    Effect of Pain on Daily Activities AROM, strength and L shoulder function are limited              [facility] PT Assessment - 05/07/20 0001      Assessment   Medical Diagnosis Severe L shoulder OA    Referring Provider (  PT) Leandrew Koyanagi MD    Onset Date/Surgical Date --   Chronic     Balance Screen   Has the patient fallen in the past 6 months No    Has the patient had a decrease in activity level because of a fear of falling?  No    Is the patient reluctant to leave their home because of a fear of falling?  No      Cognition   Overall Cognitive Status Within Functional Limits for tasks assessed      ROM / Strength   AROM / PROM / Strength AROM;Strength      AROM   Overall AROM  Deficits    AROM Assessment Site Shoulder    Right/Left Shoulder Left;Right    Right Shoulder Flexion 165 Degrees    Right Shoulder Internal Rotation 45 Degrees    Right Shoulder External Rotation 90 Degrees    Right Shoulder Horizontal  ADduction 25 Degrees    Left Shoulder Flexion 115 Degrees    Left Shoulder Internal Rotation 15 Degrees    Left Shoulder External Rotation 40  Degrees    Left Shoulder Horizontal ADduction 5 Degrees      Strength   Overall Strength Deficits    Strength Assessment Site Shoulder    Right/Left Shoulder Left;Right    Right Shoulder Internal Rotation 4/5    Right Shoulder External Rotation 4-/5    Left Shoulder Internal Rotation 3-/5    Left Shoulder External Rotation 3-/5                      Objective measurements completed on examination: See above findings.       [facility] Adult PT Treatment/Exercise - 05/07/20 0001      Exercises   Exercises Shoulder      Shoulder Exercises: Supine   Protraction Strengthening;Left;20 reps   3 seconds   External Rotation AROM;Left;10 reps   10 seconds   Internal Rotation AROM;Left;10 reps   10 seconds     Shoulder Exercises: Seated   Row Strengthening;Both;10 reps   5 seconds shoulder blade pinches     Shoulder Exercises: Standing   Other Standing Exercises Posterior capsule stretch 10X 10 seconds                  PT Education - 05/07/20 1451    Education Details Discussed shoulder anatomy, non-surgical goals (increased AROM, strength and function) and prescribed/reviewed an appropriate HEP to address impairments noted at today's evaluation.    Person(s) Educated Patient    Methods Explanation;Demonstration;Tactile cues;Verbal cues;Handout    Comprehension Verbal cues required;Need further instruction;Verbalized understanding;Tactile cues required               PT Long Term Goals - 05/07/20 1457      PT LONG TERM GOAL #1   Title Nishaan will report L shoulder pain consistently 0-3/10 on the Numeric Pain Rating Scale.    Baseline Can be 5/10    Time 8    Period Weeks    Status New    Target Date 07/02/20      PT LONG TERM GOAL #2   Title Improve L shoulder AROM for flexion to 150; ER to 70; IR to 50 and HA to 30 degrees.    Baseline 115; 40; 15; 5 respectively    Time 8    Period Weeks    Status New    Target Date 07/02/20  PT LONG TERM GOAL  #3   Title Jhonathan will be able to sleep 4-5 hours uninterrupted without shoulder pain.    Baseline Shoulder frequently interrupts sleep.    Time 8    Period Weeks    Status New    Target Date 07/02/20      PT LONG TERM GOAL #4   Title Braidyn will be independent with his updated long-term maintenance HEP at DC.    Time 8    Period Weeks    Status New    Target Date 07/02/20                  Plan - 05/07/20 1453    Clinical Impression Statement Jakarie has severe L glenohumeral arthritis that is limiting his ability to reach and use his L arm for functional activities.  Sleep is also affected.  He would like to move better with less pain and improved strength.  RA is expected in 4 weeks to see if he may be a candidate for TSA or reverse TSA, based on progress with PT and MD recommendations.    Personal Factors and Comorbidities Comorbidity 1    Comorbidities Severe L shoulder OA    Examination-Activity Limitations Bathing;Dressing;Sleep;Hygiene/Grooming;Bed Mobility;Reach Overhead;Carry    Examination-Participation Restrictions Occupation    Stability/Clinical Decision Making Stable/Uncomplicated    Clinical Decision Making Low    Rehab Potential Good    PT Frequency 2x / week    PT Duration 8 weeks    PT Treatment/Interventions ADLs/Self Care Home Management;Moist Heat;Cryotherapy;Therapeutic activities;Therapeutic exercise;Neuromuscular re-education;Patient/family education;Manual techniques;Passive range of motion;Joint Manipulations    PT Next Visit Plan Continued AROM, capsular stretching, scapular and RTC strength work.    PT Home Exercise Plan Access Code: [email or web address]    Consulted and Agree with Plan of Care Patient           Patient will benefit from skilled therapeutic intervention in order to improve the following deficits and impairments:  Decreased range of motion, Decreased strength, Impaired UE functional use, Pain  Visit Diagnosis: Stiffness of left shoulder, not  elsewhere classified  Chronic left shoulder pain  Muscle weakness (generalized)     Problem List Patient Active Problem List   Diagnosis Date Noted   Primary osteoarthritis of left shoulder 05/05/2020   Diverticulitis 05/03/2018   Status post laparoscopic Nissen fundoplication and repair of large hiatus hernia June 2016 03/10/2015   Ventral hernia-s/p repair x 2 09/07/2011   History of prostate cancer-s/p prostatectomy 09/07/2011    Farley Ly PT, MPT 05/07/2020, 3:02 PM  [facility] [address] [place], [place], [address] Phone: [phone]   Fax:  [phone]  Name: MELCHOR KIRCHGESSNER MRN: [id number] Date of Birth: 09/16/1945

## 2020-05-07 NOTE — Patient Instructions (Addendum)
Access Code: [email or web address] URL: [email or web address] Date: 05/07/2020 Prepared by: Vista Mink  Exercises Supine Scapular Protraction in Flexion with Dumbbells - 2-3 x daily - 7 x weekly - 1 sets - 10-20 reps - 3 seconds hold Standing Scapular Retraction - 5 x daily - 7 x weekly - 1 sets - 5 reps - 5 hold Supine Shoulder External Rotation Stretch - 2-3 x daily - 7 x weekly - 1 sets - 10-20 reps - 10 seconds hold Supine Shoulder Internal Rotation Stretch - 2-3 x daily - 7 x weekly - 1 sets - 10-20 reps - 10 secondsinutes hold Standing Shoulder Posterior Capsule Stretch - 2-3 x daily - 7 x weekly - 1 sets - 10 reps - 10 hold

## 2020-05-26 ENCOUNTER — Ambulatory Visit: Payer: Medicare Other | Admitting: Rehabilitative and Restorative Service Providers"

## 2020-05-26 ENCOUNTER — Other Ambulatory Visit: Payer: Self-pay

## 2020-05-26 ENCOUNTER — Encounter: Payer: Self-pay | Admitting: Rehabilitative and Restorative Service Providers"

## 2020-05-26 ENCOUNTER — Ambulatory Visit (INDEPENDENT_AMBULATORY_CARE_PROVIDER_SITE_OTHER): Payer: Medicare Other | Admitting: Rehabilitative and Restorative Service Providers"

## 2020-05-26 DIAGNOSIS — M25612 Stiffness of left shoulder, not elsewhere classified: Secondary | ICD-10-CM | POA: Diagnosis not present

## 2020-05-26 DIAGNOSIS — M25512 Pain in left shoulder: Secondary | ICD-10-CM | POA: Diagnosis not present

## 2020-05-26 DIAGNOSIS — G8929 Other chronic pain: Secondary | ICD-10-CM | POA: Diagnosis not present

## 2020-05-26 DIAGNOSIS — M6281 Muscle weakness (generalized): Secondary | ICD-10-CM | POA: Diagnosis not present

## 2020-05-26 NOTE — Therapy (Signed)
[facility] [address] [place], [place], [address] Phone: [phone]   Fax:  [phone]  Physical Therapy Treatment  Patient Details  Name: Troy [name] MRN: [id number] Date of Birth: [birth year] Referring Provider (PT): Troy Koyanagi MD   Encounter Date: 05/26/2020   PT End of Session - 05/26/20 0855    Visit Number 2    Number of Visits 16    Progress Note Due on Visit 10    PT Start Time 0851    PT Stop Time 0930    PT Time Calculation (min) 39 min    Activity Tolerance Patient tolerated treatment well    Behavior During Therapy [facility] for tasks assessed/performed           Past Medical History:  Diagnosis Date   Abdominal distention    hernia   Anemia    Arthritis    big toes    Cancer ([place])    prostate    GERD (gastroesophageal reflux disease)    Hypertension    Lumbar herniated disc    L5   Rash    yeast infection from bladder sling surgery    Sciatica of right side 09-01-11   right leg-tx. Tylenol    Past Surgical History:  Procedure Laterality Date   HERNIA REPAIR  September 01, 2010   INCONTINENCE SURGERY  Jan 31, 2011   INSERTION OF MESH  03/10/2015   Procedure: INSERTION OF MESH;  Surgeon: Troy Hausen, MD;  [facility];  Service: General;;   IR RADIOLOGIST EVAL & MGMT  05/15/2018   LAPAROSCOPIC NISSEN FUNDOPLICATION N/A 2/56/3893   Procedure: LAPAROSCOPIC NISSEN FUNDOPLICATION AND HIATAL HERNIA REPAIR;  Surgeon: Troy Hausen, MD;  [facility];  Service: General;  Laterality: N/A;   PROSTATECTOMY  reb 9, 2010   TONSILLECTOMY     UPPER GI ENDOSCOPY N/A 03/10/2015   Procedure: UPPER GI ENDOSCOPY;  Surgeon: Troy Hausen, MD;  [facility];  Service: General;  Laterality: N/A;   VENTRAL HERNIA REPAIR  09/06/2011   Procedure: HERNIA REPAIR VENTRAL ADULT;  Surgeon: Troy Earls, MD;  [facility];  Service: General;  Laterality: N/A;   VENTRAL HERNIA REPAIR  09/06/2011    Procedure: LAPAROSCOPIC VENTRAL HERNIA;  Surgeon: Troy Earls, MD;  [facility];  Service: General;  Laterality: N/A;    There were no vitals filed for this visit.   Subjective Assessment - 05/26/20 0854    Subjective Pt. indicated feeling like he has made some improvements in ability and symptoms.  Less complaints noted at night.  Still noticing with some random movements.    Limitations Lifting    Patient Stated Goals Improve pain-free AROM and strength of L shoulder.    Currently in Pain? No/denies    Pain Score 0-No pain    Pain Onset More than a month ago                             [facility] Adult PT Treatment/Exercise - 05/26/20 0001      Shoulder Exercises: Supine   Other Supine Exercises supine wand flexion, er 5 sec hold x 15 each way      Shoulder Exercises: Seated   Row --    Theraband Level (Shoulder Row) --      Shoulder Exercises: Standing   External Rotation Left   2 x 10   Theraband Level (Shoulder External Rotation) Level  2 (Red)    Internal Rotation Left   3 x 10   Theraband Level (Shoulder Internal Rotation) Level 2 (Red)    Row Both;Other (comment)   3 x 15   Theraband Level (Shoulder Row) Level 3 (Green)      Shoulder Exercises: ROM/Strengthening   UBE (Upper Arm Bike) Lvl 2 4 mins fwd/back each way      Shoulder Exercises: Isometric Strengthening   Other Isometric Exercises demo of er/ir isometric at wall 5 sec hold                       PT Long Term Goals - 05/07/20 1457      PT LONG TERM GOAL #1   Title [name] will report L shoulder pain consistently 0-3/10 on the Numeric Pain Rating Scale.    Baseline Can be 5/10    Time 8    Period Weeks    Status New    Target Date 07/02/20      PT LONG TERM GOAL #2   Title Improve L shoulder AROM for flexion to 150; ER to 70; IR to 50 and HA to 30 degrees.    Baseline 115; 40; 15; 5 respectively    Time 8    Period Weeks    Status New    Target Date 07/02/20       PT LONG TERM GOAL #3   Title [name] will be able to sleep 4-5 hours uninterrupted without shoulder pain.    Baseline Shoulder frequently interrupts sleep.    Time 8    Period Weeks    Status New    Target Date 07/02/20      PT LONG TERM GOAL #4   Title [name] will be independent with his updated long-term maintenance HEP at DC.    Time 8    Period Weeks    Status New    Target Date 07/02/20                 Plan - 05/26/20 0932    Clinical Impression Statement Joint crepitus noted and restriction in passive and active mobility for [facility] jt as well as strength deficits limiting for daily activity.  Adjusted HEP to promote end range movmeent within tolerance.    Personal Factors and Comorbidities Comorbidity 1    Comorbidities Severe L shoulder OA    Examination-Activity Limitations Bathing;Dressing;Sleep;Hygiene/Grooming;Bed Mobility;Reach Overhead;Carry    Examination-Participation Restrictions Occupation    Stability/Clinical Decision Making Stable/Uncomplicated    Rehab Potential Good    PT Frequency 2x / week    PT Duration 8 weeks    PT Treatment/Interventions ADLs/Self Care Home Management;Moist Heat;Cryotherapy;Therapeutic activities;Therapeutic exercise;Neuromuscular re-education;Patient/family education;Manual techniques;Passive range of motion;Joint Manipulations    PT Next Visit Plan Continued AROM, capsular stretching, scapular and RTC strength work.    PT Home Exercise Plan Access Code: [email or web address]    Consulted and Agree with Plan of Care Patient           Patient will benefit from skilled therapeutic intervention in order to improve the following deficits and impairments:  Decreased range of motion, Decreased strength, Impaired UE functional use, Pain  Visit Diagnosis: Chronic left shoulder pain  Stiffness of left shoulder, not elsewhere classified  Muscle weakness (generalized)     Problem List Patient Active Problem List   Diagnosis Date Noted    Primary osteoarthritis of left shoulder 05/05/2020   Diverticulitis 05/03/2018   Status post laparoscopic Nissen fundoplication  and repair of large hiatus hernia June 2016 03/10/2015   Ventral hernia-s/p repair x 2 09/07/2011   History of prostate cancer-s/p prostatectomy 09/07/2011    Scot Jun, PT, DPT, OCS, ATC 05/26/20  9:35 AM    [facility] [address] [name], [place], [address] Phone: [phone]   Fax:  [phone]  Name: Troy [name] MRN: [id number] Date of Birth: Oct 12, 1945

## 2020-06-09 ENCOUNTER — Other Ambulatory Visit: Payer: Self-pay

## 2020-06-09 ENCOUNTER — Encounter: Payer: Self-pay | Admitting: Rehabilitative and Restorative Service Providers"

## 2020-06-09 ENCOUNTER — Ambulatory Visit (INDEPENDENT_AMBULATORY_CARE_PROVIDER_SITE_OTHER): Payer: Medicare Other | Admitting: Rehabilitative and Restorative Service Providers"

## 2020-06-09 DIAGNOSIS — M25612 Stiffness of left shoulder, not elsewhere classified: Secondary | ICD-10-CM

## 2020-06-09 DIAGNOSIS — M25512 Pain in left shoulder: Secondary | ICD-10-CM

## 2020-06-09 DIAGNOSIS — G8929 Other chronic pain: Secondary | ICD-10-CM | POA: Diagnosis not present

## 2020-06-09 DIAGNOSIS — M6281 Muscle weakness (generalized): Secondary | ICD-10-CM

## 2020-06-09 NOTE — Therapy (Signed)
[name], [place], [address] Phone: [phone]   Fax:  [phone]  Physical Therapy Treatment  Patient Details  Name: Troy [name] MRN: [id number] Date of Birth: 1945-11-30 Referring Provider (PT): Leandrew Koyanagi MD   Encounter Date: 06/09/2020   PT End of Session - 06/09/20 1222    Visit Number 3    Number of Visits 16    Progress Note Due on Visit 10    PT Start Time 0801    PT Stop Time 3646    PT Time Calculation (min) 43 min    Activity Tolerance Patient tolerated treatment well    Behavior During Therapy [facility] for tasks assessed/performed           Past Medical History:  Diagnosis Date  . Abdominal distention    hernia  . Anemia   . Arthritis    big toes   . Cancer [facility])    prostate   . GERD (gastroesophageal reflux disease)   . Hypertension   . Lumbar herniated disc    L5  . Rash    yeast infection from bladder sling surgery   . Sciatica of right side 09-01-11   right leg-tx. Tylenol    Past Surgical History:  Procedure Laterality Date  . HERNIA REPAIR  September 01, 2010  . INCONTINENCE SURGERY  Jan 31, 2011  . INSERTION OF MESH  03/10/2015   Procedure: INSERTION OF MESH;  Surgeon: Johnathan Hausen, MD;  [facility]: [facility];  Service: General;;  . IR RADIOLOGIST EVAL & MGMT  05/15/2018  . LAPAROSCOPIC NISSEN FUNDOPLICATION N/A 04/13/2121   Procedure: LAPAROSCOPIC NISSEN FUNDOPLICATION AND HIATAL HERNIA REPAIR;  Surgeon: Johnathan Hausen, MD;  [facility];  Service: General;  Laterality: N/A;  . PROSTATECTOMY  reb 9, 2010  . TONSILLECTOMY    . UPPER GI ENDOSCOPY N/A 03/10/2015   Procedure: UPPER GI ENDOSCOPY;  Surgeon: Johnathan Hausen, MD;  [facility];  Service: General;  Laterality: N/A;  . VENTRAL HERNIA REPAIR  09/06/2011   Procedure: HERNIA REPAIR VENTRAL ADULT;  Surgeon: Pedro Earls, MD;  [facility];  Service: General;  Laterality: N/A;  . VENTRAL HERNIA REPAIR  09/06/2011    Procedure: LAPAROSCOPIC VENTRAL HERNIA;  Surgeon: Pedro Earls, MD;  [facility];  Service: General;  Laterality: N/A;    There were no vitals filed for this visit.   Subjective Assessment - 06/09/20 1213    Subjective [name] notes more AROM and less pain since starting PT.    Limitations Lifting    Patient Stated Goals Improve pain-free AROM and strength of L shoulder.    Currently in Pain? No/denies    Pain Onset More than a month ago    Aggravating Factors  Overuse    Pain Relieving Factors Exercises    Effect of Pain on Daily Activities Still limited AROM and strength    Multiple Pain Sites No                             [facility] Adult PT Treatment/Exercise - 06/09/20 0001      Exercises   Exercises Shoulder      Shoulder Exercises: Supine   Protraction Strengthening;Left;20 reps   3 seconds   Protraction Weight (lbs) 5#    External Rotation AROM;Left;10 reps   10 seconds   Internal Rotation AROM;Left;10 reps   10 seconds   Flexion AROM;Left;10  reps   10 seconds     Shoulder Exercises: Seated   Row Strengthening;Both;10 reps   5 seconds shoulder blade pinches     Shoulder Exercises: Standing   Internal Rotation AROM;Left;10 reps   10 seconds thumb up the back stretch   Other Standing Exercises Posterior capsule stretch 10X 10 seconds                  PT Education - 06/09/20 1214    Education Details Reviewed HEP and added new AROM activities.    Person(s) Educated Patient    Methods Explanation;Demonstration;Verbal cues;Handout;Tactile cues    Comprehension Need further instruction;Returned demonstration;Verbalized understanding;Tactile cues required;Verbal cues required               PT Long Term Goals - 06/09/20 1215      PT LONG TERM GOAL #1   Title [name] will report L shoulder pain consistently 0-3/10 on the Numeric Pain Rating Scale.    Baseline Can be 5/10    Time 8    Period Weeks    Status Partially Met      PT LONG  TERM GOAL #2   Title Improve L shoulder AROM for flexion to 150; ER to 70; IR to 50 and HA to 30 degrees.    Baseline 115; 40; 15; 5 respectively    Time 8    Period Weeks    Status On-going      PT LONG TERM GOAL #3   Title [name] will be able to sleep 4-5 hours uninterrupted without shoulder pain.    Baseline Shoulder frequently interrupts sleep.    Time 8    Period Weeks    Status Achieved      PT LONG TERM GOAL #4   Title [name] will be independent with his updated long-term maintenance HEP at DC.    Time 8    Period Weeks    Status On-going                 Plan - 06/09/20 1222    Clinical Impression Statement Almir reports "about 1X/day" HEP compliance.  He notes improvements in sleep quality and he is no longer taking pain meds.  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  Continue AROM emphasis to meet LTGs.    Personal Factors and Comorbidities Comorbidity 1    Comorbidities Severe L shoulder OA    Examination-Activity Limitations Bathing;Dressing;Sleep;Hygiene/Grooming;Bed Mobility;Reach Overhead;Carry    Examination-Participation Restrictions Occupation    Stability/Clinical Decision Making Stable/Uncomplicated    Rehab Potential Good    PT Frequency 2x / week    PT Duration 8 weeks    PT Treatment/Interventions ADLs/Self Care Home Management;Moist Heat;Cryotherapy;Therapeutic activities;Therapeutic exercise;Neuromuscular re-education;Patient/family education;Manual techniques;Passive range of motion;Joint Manipulations    PT Next Visit Plan Continued AROM, capsular stretching, scapular and RTC strength work.    PT Home Exercise Plan Access Code: [email or web address]    Consulted and Agree with Plan of Care Patient           Patient will benefit from skilled therapeutic intervention in order to improve the following deficits and impairments:  Decreased range of motion, Decreased  strength, Impaired UE functional use, Pain  Visit Diagnosis: Chronic left shoulder pain  Stiffness of left shoulder, not elsewhere classified  Muscle weakness (generalized)     Problem List Patient Active Problem List   Diagnosis Date  Noted  . Primary osteoarthritis of left shoulder 05/05/2020  . Diverticulitis 05/03/2018  . Status post laparoscopic Nissen fundoplication and repair of large hiatus hernia June 2016 03/10/2015  . Ventral hernia-s/p repair x 2 09/07/2011  . History of prostate cancer-s/p prostatectomy 09/07/2011    Farley Ly PT, MPT 06/09/2020, 12:25 PM  [facility] [address] [place], [place], [address] Phone: [phone]   Fax:  [phone]  Name: DANILO CAPPIELLO MRN: [id number] Date of Birth: 01/26/1946

## 2020-06-09 NOTE — Patient Instructions (Signed)
Access Code: [email or web address] URL: [email or web address] Date: 06/09/2020 Prepared by: Vista Mink  Exercises Supine Shoulder Flexion AAROM with Hands Clasped - 2-3 x daily - 7 x weekly - 1 sets - 10-20 reps - 10 seconds hold Standing Shoulder Internal Rotation Stretch with Hands Behind Back - 2-3 x daily - 7 x weekly - 1 sets - 10 reps - 10 hold

## 2020-06-18 ENCOUNTER — Other Ambulatory Visit: Payer: Self-pay

## 2020-06-18 ENCOUNTER — Ambulatory Visit (INDEPENDENT_AMBULATORY_CARE_PROVIDER_SITE_OTHER): Payer: Medicare Other | Admitting: Rehabilitative and Restorative Service Providers"

## 2020-06-18 ENCOUNTER — Encounter: Payer: Self-pay | Admitting: Rehabilitative and Restorative Service Providers"

## 2020-06-18 DIAGNOSIS — M25612 Stiffness of left shoulder, not elsewhere classified: Secondary | ICD-10-CM

## 2020-06-18 DIAGNOSIS — G8929 Other chronic pain: Secondary | ICD-10-CM

## 2020-06-18 DIAGNOSIS — M25512 Pain in left shoulder: Secondary | ICD-10-CM | POA: Diagnosis not present

## 2020-06-18 DIAGNOSIS — M6281 Muscle weakness (generalized): Secondary | ICD-10-CM | POA: Diagnosis not present

## 2020-06-18 NOTE — Therapy (Signed)
Troy [name] [facility] [address] [place], [place], [address] Phone: [phone]   Fax:  [phone]  Physical Therapy Treatment/Reassessment  Patient Details  Name: Troy [name] MRN: [id number] Date of Birth: [birth year] Referring Provider (PT): Leandrew Koyanagi MD   Encounter Date: 06/18/2020   PT End of Session - 06/18/20 1703    Visit Number 4    Number of Visits 16    Progress Note Due on Visit 10    PT Start Time 0850    PT Stop Time 0930    PT Time Calculation (min) 40 min    Activity Tolerance Patient tolerated treatment well;No increased pain    Behavior During Therapy WFL for tasks assessed/performed           Past Medical History:  Diagnosis Date  . Abdominal distention    hernia  . Anemia   . Arthritis    big toes   . Cancer [facility])    prostate   . GERD (gastroesophageal reflux disease)   . Hypertension   . Lumbar herniated disc    L5  . Rash    yeast infection from bladder sling surgery   . Sciatica of right side 09-01-11   right leg-tx. Tylenol    Past Surgical History:  Procedure Laterality Date  . HERNIA REPAIR  September 01, 2010  . INCONTINENCE SURGERY  Jan 31, 2011  . INSERTION OF MESH  03/10/2015   Procedure: INSERTION OF MESH;  Surgeon: Johnathan Hausen, MD;  Location: [facility];  Service: General;;  . IR RADIOLOGIST EVAL & MGMT  05/15/2018  . LAPAROSCOPIC NISSEN FUNDOPLICATION N/A 8/52/7782   Procedure: LAPAROSCOPIC NISSEN FUNDOPLICATION AND HIATAL HERNIA REPAIR;  Surgeon: Johnathan Hausen, MD;  Location: [facility];  Service: General;  Laterality: N/A;  . PROSTATECTOMY  reb 9, 2010  . TONSILLECTOMY    . UPPER GI ENDOSCOPY N/A 03/10/2015   Procedure: UPPER GI ENDOSCOPY;  Surgeon: Johnathan Hausen, MD;  Location: [facility];  Service: General;  Laterality: N/A;  . VENTRAL HERNIA REPAIR  09/06/2011   Procedure: HERNIA REPAIR VENTRAL ADULT;  Surgeon: Pedro Earls, MD;  Location: [facility];  Service: General;  Laterality: N/A;  .  VENTRAL HERNIA REPAIR  09/06/2011   Procedure: LAPAROSCOPIC VENTRAL HERNIA;  Surgeon: Pedro Earls, MD;  Location: [facility];  Service: General;  Laterality: N/A;    There were no vitals filed for this visit.   Subjective [name] - 06/18/20 1701    Subjective Troy [name] notes more AROM and less pain since starting PT.  Objective measures today reflect this.    Limitations Lifting    Patient Stated Goals Improve pain-free AROM and strength of L shoulder.    Currently in Pain? No/denies    Pain Onset More than a month ago    Pain Frequency Intermittent    Aggravating Factors  Overuse and certain end-range postures.    Pain Relieving Factors Exercises    Effect of Pain on Daily Activities Troy [name] would like more flexibility and strength to participate in more activities without apprehension.              Troy [name] - 06/18/20 0001      AROM   Left Shoulder Flexion 130 Degrees    Left Shoulder Internal Rotation 25 Degrees    Left Shoulder External Rotation 50 Degrees    Left Shoulder Horizontal ADduction 20 Degrees  Troy [name] Adult PT Treatment/Exercise - 06/18/20 0001      Exercises   Exercises Shoulder      Shoulder Exercises: Supine   Protraction Strengthening;Left;20 reps   3 seconds   Protraction Weight (lbs) 6#    External Rotation AROM;Left;10 reps   10 seconds   Internal Rotation AROM;Left;10 reps   10 seconds   Flexion AROM;Left;10 reps   10 seconds     Shoulder Exercises: Seated   Row Strengthening;Both;10 reps   5 seconds shoulder blade pinches     Shoulder Exercises: Standing   Internal Rotation AROM;Left;10 reps   10 seconds thumb up the back stretch   Other Standing Exercises Posterior capsule stretch 10X 10 seconds                  PT Education - 06/18/20 1702    Education Details Reviewed RA findings and HEP.    Person(s) Educated Patient    Methods Explanation;Demonstration;Tactile cues;Verbal cues     Comprehension Verbalized understanding;Tactile cues required;Need further instruction;Returned demonstration;Verbal cues required               PT Long Term Goals - 06/18/20 1702      PT LONG TERM GOAL #1   Title Troy [name] will report L shoulder pain consistently 0-3/10 on the Numeric Pain Rating Scale.    Baseline Can be 5/10    Time 8    Period Weeks    Status Achieved      PT LONG TERM GOAL #2   Title Improve L shoulder AROM for flexion to 150; ER to 70; IR to 50 and HA to 30 degrees.    Baseline 130; 50; 25; 20 respectively    Time 8    Period Weeks    Status On-going      PT LONG TERM GOAL #3   Title Troy [name] will be able to sleep 4-5 hours uninterrupted without shoulder pain.    Baseline Shoulder frequently interrupts sleep.    Time 8    Period Weeks    Status Achieved      PT LONG TERM GOAL #4   Title Troy [name] will be independent with his updated long-term maintenance HEP at [place].    Time 8    Period Weeks    Status On-going                 Plan - 06/18/20 1704    Clinical Impression Statement Troy [name] is very happy with his physical therapy progress.  Sleep is improved and he reports "significantly less pain."  Objective AROM is better.  Troy [name] was given the option of independent rehabilitation but he would like to continue supervised PT as he would like to gain more AROM and strength to be more confident with his L shoulder use.    Personal Factors and Comorbidities Comorbidity 1    Comorbidities Severe L shoulder OA    Examination-Activity Limitations Bathing;Dressing;Sleep;Hygiene/Grooming;Bed Mobility;Reach Overhead;Carry    Examination-Participation Restrictions Occupation    Stability/Clinical Decision Making Stable/Uncomplicated    Rehab Potential Good    PT Frequency 2x / week    PT Duration 8 weeks    PT Treatment/Interventions ADLs/Self Care Home Management;Moist Heat;Cryotherapy;Therapeutic activities;Therapeutic exercise;Neuromuscular  re-education;Patient/family education;Manual techniques;Passive range of motion;Joint Manipulations    PT Next Visit Plan Continued AROM, capsular stretching, scapular and RTC strength work.    PT Home Exercise Plan Access Code: [email or web address]    Consulted and Agree with Plan of Care Patient  Patient will benefit from skilled therapeutic intervention in order to improve the following deficits and impairments:  Decreased range of motion, Decreased strength, Impaired UE functional use, Pain  Visit Diagnosis: Chronic left shoulder pain  Stiffness of left shoulder, not elsewhere classified  Muscle weakness (generalized)     Problem List Patient Active Problem List   Diagnosis Date Noted  . Primary osteoarthritis of left shoulder 05/05/2020  . Diverticulitis 05/03/2018  . Status post laparoscopic Nissen fundoplication and repair of large hiatus hernia June 2016 03/10/2015  . Ventral hernia-s/p repair x 2 09/07/2011  . History of prostate cancer-s/p prostatectomy 09/07/2011    Troy [name] PT, MPT 06/18/2020, 5:07 PM  [facility] [address] [place], [place], [address] Phone: [phone]   Fax:  [phone]  Name: Troy [name] MRN: [id number] Date of Birth: 1946-05-07

## 2020-06-25 ENCOUNTER — Encounter: Payer: Medicare Other | Admitting: Rehabilitative and Restorative Service Providers"

## 2020-07-02 ENCOUNTER — Encounter: Payer: Medicare Other | Admitting: Physical Therapy

## 2020-07-14 ENCOUNTER — Other Ambulatory Visit: Payer: Self-pay

## 2020-07-14 ENCOUNTER — Ambulatory Visit (INDEPENDENT_AMBULATORY_CARE_PROVIDER_SITE_OTHER): Payer: Medicare Other | Admitting: Physical Therapy

## 2020-07-14 DIAGNOSIS — M25512 Pain in left shoulder: Secondary | ICD-10-CM

## 2020-07-14 DIAGNOSIS — G8929 Other chronic pain: Secondary | ICD-10-CM | POA: Diagnosis not present

## 2020-07-14 DIAGNOSIS — M25612 Stiffness of left shoulder, not elsewhere classified: Secondary | ICD-10-CM

## 2020-07-14 DIAGNOSIS — M6281 Muscle weakness (generalized): Secondary | ICD-10-CM

## 2020-07-14 NOTE — Therapy (Signed)
[facility] [address] [place], [place], [address] Phone: [phone]   Fax:  [phone]  Physical Therapy Treatment/Discharge  PHYSICAL THERAPY DISCHARGE SUMMARY  Visits from Start of Care: 5  Current functional level related to goals / functional outcomes: See below   Remaining deficits: See below   Education / Equipment: HEP  Plan: Patient agrees to discharge.  Patient goals were met. Patient is being discharged due to being pleased with the current functional level.  ?????       Patient Details  Name: Troy [name] MRN: [id number] Date of Birth: 1946-04-21 Referring Provider (PT): Leandrew Koyanagi MD   Encounter Date: 07/14/2020   PT End of Session - 07/14/20 0851    Visit Number 5    Number of Visits 16    Progress Note Due on Visit 10    PT Start Time 0805    PT Stop Time 2549    PT Time Calculation (min) 42 min    Activity Tolerance Patient tolerated treatment well;No increased pain    Behavior During Therapy WFL for tasks assessed/performed           Past Medical History:  Diagnosis Date   Abdominal distention    hernia   Anemia    Arthritis    big toes    Cancer (HCC)    prostate    GERD (gastroesophageal reflux disease)    Hypertension    Lumbar herniated disc    L5   Rash    yeast infection from bladder sling surgery    Sciatica of right side 09-01-11   right leg-tx. Tylenol    Past Surgical History:  Procedure Laterality Date   HERNIA REPAIR  September 01, 2010   INCONTINENCE SURGERY  Jan 31, 2011   INSERTION OF MESH  03/10/2015   Procedure: INSERTION OF MESH;  Surgeon: Johnathan Hausen, MD;  [facility]: [facility];  Service: General;;   IR RADIOLOGIST EVAL & MGMT  05/15/2018   LAPAROSCOPIC NISSEN FUNDOPLICATION N/A 05/06/4157   Procedure: LAPAROSCOPIC NISSEN FUNDOPLICATION AND HIATAL HERNIA REPAIR;  Surgeon: Johnathan Hausen, MD;  Location: [facility];  Service: General;  Laterality: N/A;    PROSTATECTOMY  reb 9, 2010   TONSILLECTOMY     UPPER GI ENDOSCOPY N/A 03/10/2015   Procedure: UPPER GI ENDOSCOPY;  Surgeon: Johnathan Hausen, MD;  Location: [facility];  Service: General;  Laterality: N/A;   VENTRAL HERNIA REPAIR  09/06/2011   Procedure: HERNIA REPAIR VENTRAL ADULT;  Surgeon: Pedro Earls, MD;  [facility]: [facility];  Service: General;  Laterality: N/A;   VENTRAL HERNIA REPAIR  09/06/2011   Procedure: LAPAROSCOPIC VENTRAL HERNIA;  Surgeon: Pedro Earls, MD;  Location: [facility];  Service: General;  Laterality: N/A;    There were no vitals filed for this visit.   Subjective Assessment - 07/14/20 0820    Subjective he notes more ROM and this is helping his shoulder. No pain at rest upon arrival but still gets pain with reaching to end range    Limitations Lifting    Patient Stated Goals Improve pain-free AROM and strength of L shoulder.    Pain Onset More than a month ago                             [facility] Adult PT Treatment/Exercise - 07/14/20 0001      Shoulder Exercises: Standing   Other Standing Exercises wall ladder 10  reps holding 5 sec in flexion    Other Standing Exercises Rows and extensions green X 20 ea. IR and ER with red band 3X10 ea on Lt      Shoulder Exercises: Pulleys   Flexion 3 minutes    ABduction 3 minutes      Shoulder Exercises: Stretch   Cross Chest Stretch Limitations post capsule stretch 20 sec X 4 reps      Manual Therapy   Manual therapy comments Lt shoulder PROM, [place] mobs                  PT Education - 07/14/20 0850    Education Details HEP progression    Person(s) Educated Patient    Methods Explanation;Demonstration;Verbal cues;Handout    Comprehension Verbalized understanding;Returned demonstration               PT Long Term Goals - 07/14/20 0854      PT LONG TERM GOAL #1   Title Bellamy will report L shoulder pain consistently 0-3/10 on the Numeric Pain Rating Scale.    Time 8    Period Weeks     Status Achieved      PT LONG TERM GOAL #2   Title Improve L shoulder AROM for flexion to 150; ER to 70; IR to 50 and HA to 30 degrees.    Time 8    Period Weeks    Status Achieved      PT LONG TERM GOAL #3   Title Tallis will be able to sleep 4-5 hours uninterrupted without shoulder pain.    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    Time 8    Period Weeks    Status Achieved                 Plan - 07/14/20 0851    Clinical Impression Statement He is pleased with his progress and feels ready to discharge to HEP. HEP was progressed for him now that he will not be returning to PT. He had no further questions or concerns and was encouraged to return in the event he regresses.    Personal Factors and Comorbidities Comorbidity 1    Comorbidities Severe L shoulder OA    Examination-Activity Limitations Bathing;Dressing;Sleep;Hygiene/Grooming;Bed Mobility;Reach Overhead;Carry    Examination-Participation Restrictions Occupation    Stability/Clinical Decision Making Stable/Uncomplicated    Rehab Potential Good    PT Frequency 2x / week    PT Duration 8 weeks    PT Treatment/Interventions ADLs/Self Care Home Management;Moist Heat;Cryotherapy;Therapeutic activities;Therapeutic exercise;Neuromuscular re-education;Patient/family education;Manual techniques;Passive range of motion;Joint Manipulations    PT Next Visit Plan Continued AROM, capsular stretching, scapular and RTC strength work.    PT Home Exercise Plan Access Code: [email or web address]    Consulted and Agree with Plan of Care Patient           Patient will benefit from skilled therapeutic intervention in order to improve the following deficits and impairments:  Decreased range of motion, Decreased strength, Impaired UE functional use, Pain  Visit Diagnosis: Chronic left shoulder pain  Stiffness of left shoulder, not  elsewhere classified  Muscle weakness (generalized)     Problem List Patient Active Problem List   Diagnosis Date Noted   Primary osteoarthritis of left shoulder 05/05/2020   Diverticulitis 05/03/2018   Status post  laparoscopic Nissen fundoplication and repair of large hiatus hernia June 2016 03/10/2015   Ventral hernia-s/p repair x 2 09/07/2011   History of prostate cancer-s/p prostatectomy 09/07/2011    Troy [name] 07/14/2020, 8:55 AM  [facility] [address] [place], [place], [address] Phone: [phone]   Fax:  [phone]  Name: Troy [name] MRN: [id number] Date of Birth: September 06, 1946

## 2021-03-25 ENCOUNTER — Encounter: Payer: Self-pay | Admitting: Pulmonary Disease

## 2021-03-25 ENCOUNTER — Other Ambulatory Visit: Payer: Self-pay

## 2021-03-25 ENCOUNTER — Ambulatory Visit: Payer: Medicare Other | Admitting: Pulmonary Disease

## 2021-03-25 VITALS — BP 124/76 | HR 66 | Temp 97.1°F | Ht 68.0 in | Wt 243.2 lb

## 2021-03-25 DIAGNOSIS — G4730 Sleep apnea, unspecified: Secondary | ICD-10-CM | POA: Diagnosis not present

## 2021-03-25 DIAGNOSIS — E669 Obesity, unspecified: Secondary | ICD-10-CM

## 2021-03-25 DIAGNOSIS — G4733 Obstructive sleep apnea (adult) (pediatric): Secondary | ICD-10-CM | POA: Diagnosis not present

## 2021-03-25 NOTE — Patient Instructions (Signed)
Follow up in 1 year.

## 2021-03-25 NOTE — Progress Notes (Signed)
[facility]  Chief Complaint  Patient presents with   Follow-up    Patient here to follow up on OSA.     Constitutional:  BP 124/76 (BP Location: Right Arm, Patient Position: Sitting, Cuff Size: Large)   Pulse 66   Temp (!) 97.1 F (36.2 C) (Oral)   Ht 5\' 8"  (1.727 m)   Wt 243 lb 3.2 oz (110.3 kg)   SpO2 97%   BMI 36.98 kg/m   Past Medical History:  Coronary atherosclerosis, HTN, GERD, HH s/p Nissen fundoplication, Prostate cancer 2009 s/p prostatectomy, ED, Diverticulitis, Lumbar spinal stenosis, OA, Anxiety, Insomnia, Gout, Gallstones  Past Surgical History:  He  has a past surgical history that includes Hernia repair (September 01, 2010); Incontinence surgery (Jan 31, 2011); Prostatectomy (reb 9, 2010); Ventral hernia repair (09/06/2011); Ventral hernia repair (09/06/2011); Tonsillectomy; Laparoscopic Nissen fundoplication (N/A, 1/61/0960); Upper gi endoscopy (N/A, 03/10/2015); Insertion of mesh (03/10/2015); and IR Radiologist Eval & Mgmt (05/15/2018).  Brief Summary:  Troy [name] is a 75 y.o. male with obstructive sleep apnea.      Subjective:   He works as a Pharmacist, community.  Uses CPAP nightly.  Has full face mask.  Gets some dryness.  Occasionally has mask leak and then wakes up.  Intermittently uses benadryl to help sleep.  Sometimes has trouble clearing his mind before going to bed.  Physical Exam:   Appearance - well kempt   ENMT - no sinus tenderness, no oral exudate, no LAN, Mallampati 3 airway, no stridor  Respiratory - equal breath sounds bilaterally, no wheezing or rales  CV - s1s2 regular rate and rhythm, no murmurs  Ext - no clubbing, no edema  Skin - no rashes  Psych - normal mood and affect   Pulmonary testing:  Spirometry 05/16/19 >> FEV1 2.22 (83%), FEV1% 65  Sleep Tests:  HST 10/04/19 >> AHI 51.3, SpO2 low 68% Auto CPAP 02/22/21 to 03/23/21 >> used on 30 of 30 nights with average 7 hrs 35 min.  Average AHI  2.5 with median CPAP 7 and 95 th percentile CPAP 10 cm H2O  Social History:  He  reports that he has never smoked. He has never used smokeless tobacco. He reports current alcohol use. He reports that he does not use drugs.  Family History:  His family history includes Bladder Cancer in his father; Cervical cancer in his sister; Coronary artery disease in his mother; Dementia in his father; Diabetes in his father; Lung cancer in his mother; Stroke in his father.     Assessment/Plan:   Obstructive sleep apnea. - he is compliant with CPAP and reports benefit from therapy - he uses [employer] for his DME - will adjust auto CPAP to 5 to 9 cm H2O to see if this decreases frequency of mask leak - discussed alternative therapies for sleep apnea  Intermittent insomnia - discussed CBT techniques - uses benadryl prn  Obesity. - he is aware of how his weight impacts sleep apnea  Time Spent Involved in Patient Care on Day of Examination:  32 minutes  Follow up:   Patient Instructions  Follow up in 1 year  Medication List:   Allergies as of 03/25/2021       Reactions   Ciprofloxacin Diarrhea, Nausea And Vomiting   Can take Levaquin        Medication List        Accurate as of March 25, 2021 12:01 PM. If you have any  questions, ask your nurse or doctor.          ALPRAZolam 0.5 MG tablet Commonly known as: XANAX Take 0.5 mg by mouth at bedtime as needed for anxiety. For sleep.   ezetimibe 10 MG tablet Commonly known as: ZETIA Take 10 mg by mouth at bedtime.   losartan 100 MG tablet Commonly known as: COZAAR Take 100 mg by mouth every morning.   LUTEIN PO Take by mouth.   Melatonin 10 MG Tabs Take by mouth daily.   metoprolol succinate 50 MG 24 hr tablet Commonly known as: TOPROL-XL Take 50 mg by mouth daily. Take with or immediately following a meal.   rosuvastatin 20 MG tablet Commonly known as: CRESTOR Take 20 mg by mouth at bedtime.   Vitamin D3 50 MCG  (2000 UT) Tabs Take 1 tablet by mouth every morning.   zinc gluconate 50 MG tablet Take 50 mg by mouth daily.        Signature:  Chesley Mires, MD [place] Pager - [phone] 03/25/2021, 12:01 PM

## 2022-05-24 ENCOUNTER — Encounter (HOSPITAL_BASED_OUTPATIENT_CLINIC_OR_DEPARTMENT_OTHER): Payer: Self-pay | Admitting: Obstetrics and Gynecology

## 2022-05-24 ENCOUNTER — Other Ambulatory Visit: Payer: Self-pay

## 2022-05-24 ENCOUNTER — Encounter (HOSPITAL_COMMUNITY): Payer: Self-pay

## 2022-05-24 ENCOUNTER — Emergency Department (HOSPITAL_BASED_OUTPATIENT_CLINIC_OR_DEPARTMENT_OTHER): Payer: Medicare Other

## 2022-05-24 ENCOUNTER — Inpatient Hospital Stay (HOSPITAL_BASED_OUTPATIENT_CLINIC_OR_DEPARTMENT_OTHER)
Admission: EM | Admit: 2022-05-24 | Discharge: 2022-05-31 | DRG: 348 | Disposition: A | Discharge status: Home / Self Care | Payer: Medicare Other | Attending: Internal Medicine | Admitting: Internal Medicine

## 2022-05-24 DIAGNOSIS — M1990 Unspecified osteoarthritis, unspecified site: Secondary | ICD-10-CM | POA: Diagnosis not present

## 2022-05-24 DIAGNOSIS — R1013 Epigastric pain: Secondary | ICD-10-CM | POA: Diagnosis present

## 2022-05-24 DIAGNOSIS — N329 Bladder disorder, unspecified: Secondary | ICD-10-CM | POA: Diagnosis present

## 2022-05-24 DIAGNOSIS — K5710 Diverticulosis of small intestine without perforation or abscess without bleeding: Secondary | ICD-10-CM | POA: Diagnosis present

## 2022-05-24 DIAGNOSIS — Z8546 Personal history of malignant neoplasm of prostate: Secondary | ICD-10-CM | POA: Diagnosis not present

## 2022-05-24 DIAGNOSIS — E876 Hypokalemia: Secondary | ICD-10-CM | POA: Diagnosis present

## 2022-05-24 DIAGNOSIS — M5431 Sciatica, right side: Secondary | ICD-10-CM | POA: Diagnosis present

## 2022-05-24 DIAGNOSIS — N2889 Other specified disorders of kidney and ureter: Secondary | ICD-10-CM | POA: Diagnosis present

## 2022-05-24 DIAGNOSIS — K219 Gastro-esophageal reflux disease without esophagitis: Secondary | ICD-10-CM | POA: Diagnosis present

## 2022-05-24 DIAGNOSIS — Z881 Allergy status to other antibiotic agents status: Secondary | ICD-10-CM

## 2022-05-24 DIAGNOSIS — E872 Acidosis, unspecified: Secondary | ICD-10-CM | POA: Diagnosis present

## 2022-05-24 DIAGNOSIS — M1712 Unilateral primary osteoarthritis, left knee: Secondary | ICD-10-CM | POA: Diagnosis present

## 2022-05-24 DIAGNOSIS — Z6834 Body mass index (BMI) 34.0-34.9, adult: Secondary | ICD-10-CM | POA: Diagnosis not present

## 2022-05-24 DIAGNOSIS — I10 Essential (primary) hypertension: Secondary | ICD-10-CM | POA: Diagnosis present

## 2022-05-24 DIAGNOSIS — K5651 Intestinal adhesions [bands], with partial obstruction: Principal | ICD-10-CM | POA: Diagnosis present

## 2022-05-24 DIAGNOSIS — M19072 Primary osteoarthritis, left ankle and foot: Secondary | ICD-10-CM | POA: Diagnosis present

## 2022-05-24 DIAGNOSIS — Z9079 Acquired absence of other genital organ(s): Secondary | ICD-10-CM

## 2022-05-24 DIAGNOSIS — M5126 Other intervertebral disc displacement, lumbar region: Secondary | ICD-10-CM | POA: Diagnosis present

## 2022-05-24 DIAGNOSIS — K4090 Unilateral inguinal hernia, without obstruction or gangrene, not specified as recurrent: Secondary | ICD-10-CM | POA: Diagnosis present

## 2022-05-24 DIAGNOSIS — R310 Gross hematuria: Secondary | ICD-10-CM | POA: Diagnosis not present

## 2022-05-24 DIAGNOSIS — Z8249 Family history of ischemic heart disease and other diseases of the circulatory system: Secondary | ICD-10-CM

## 2022-05-24 DIAGNOSIS — E669 Obesity, unspecified: Secondary | ICD-10-CM | POA: Diagnosis present

## 2022-05-24 DIAGNOSIS — Z20822 Contact with and (suspected) exposure to covid-19: Secondary | ICD-10-CM | POA: Diagnosis present

## 2022-05-24 DIAGNOSIS — E785 Hyperlipidemia, unspecified: Secondary | ICD-10-CM | POA: Diagnosis present

## 2022-05-24 DIAGNOSIS — M19071 Primary osteoarthritis, right ankle and foot: Secondary | ICD-10-CM | POA: Diagnosis present

## 2022-05-24 DIAGNOSIS — Z8619 Personal history of other infectious and parasitic diseases: Secondary | ICD-10-CM

## 2022-05-24 DIAGNOSIS — Z79899 Other long term (current) drug therapy: Secondary | ICD-10-CM

## 2022-05-24 DIAGNOSIS — N3289 Other specified disorders of bladder: Secondary | ICD-10-CM

## 2022-05-24 DIAGNOSIS — K56609 Unspecified intestinal obstruction, unspecified as to partial versus complete obstruction: Secondary | ICD-10-CM | POA: Diagnosis not present

## 2022-05-24 DIAGNOSIS — F419 Anxiety disorder, unspecified: Secondary | ICD-10-CM | POA: Diagnosis present

## 2022-05-24 LAB — CBC
HCT: 45.9 % (ref 39.0–52.0)
Hemoglobin: 16.2 g/dL (ref 13.0–17.0)
MCH: 31.4 pg (ref 26.0–34.0)
MCHC: 35.3 g/dL (ref 30.0–36.0)
MCV: 89 fL (ref 80.0–100.0)
Platelets: 326 10*3/uL (ref 150–400)
RBC: 5.16 MIL/uL (ref 4.22–5.81)
RDW: 12.5 % (ref 11.5–15.5)
WBC: 9.1 10*3/uL (ref 4.0–10.5)
nRBC: 0 % (ref 0.0–0.2)

## 2022-05-24 LAB — URINALYSIS, ROUTINE W REFLEX MICROSCOPIC
Bilirubin Urine: NEGATIVE
Glucose, UA: NEGATIVE mg/dL
Hgb urine dipstick: NEGATIVE
Ketones, ur: NEGATIVE mg/dL
Leukocytes,Ua: NEGATIVE
Nitrite: NEGATIVE
Protein, ur: NEGATIVE mg/dL
Specific Gravity, Urine: 1.028 (ref 1.005–1.030)
pH: 6 (ref 5.0–8.0)

## 2022-05-24 LAB — COMPREHENSIVE METABOLIC PANEL
ALT: 17 U/L (ref 0–44)
AST: 14 U/L — ABNORMAL LOW (ref 15–41)
Albumin: 4.8 g/dL (ref 3.5–5.0)
Alkaline Phosphatase: 70 U/L (ref 38–126)
Anion gap: 13 (ref 5–15)
BUN: 23 mg/dL (ref 8–23)
CO2: 25 mmol/L (ref 22–32)
Calcium: 10.2 mg/dL (ref 8.9–10.3)
Chloride: 100 mmol/L (ref 98–111)
Creatinine, Ser: 1.09 mg/dL (ref 0.61–1.24)
GFR, Estimated: >60 mL/min (ref >60)
Glucose, Bld: 161 mg/dL — ABNORMAL HIGH (ref 70–99)
Potassium: 3.7 mmol/L (ref 3.5–5.1)
Sodium: 138 mmol/L (ref 135–145)
Total Bilirubin: 1.5 mg/dL — ABNORMAL HIGH (ref 0.3–1.2)
Total Protein: 7.8 g/dL (ref 6.5–8.1)

## 2022-05-24 LAB — RESP PANEL BY RT-PCR (FLU A&B, COVID) ARPGX2
Influenza A by PCR: NEGATIVE
Influenza B by PCR: NEGATIVE
SARS Coronavirus 2 by RT PCR: NEGATIVE

## 2022-05-24 LAB — LACTIC ACID, PLASMA
Lactic Acid, Venous: 2.3 mmol/L (ref 0.5–1.9)
Lactic Acid, Venous: 2.5 mmol/L (ref 0.5–1.9)
Lactic Acid, Venous: 1.5 mmol/L (ref 0.5–1.9)
Lactic Acid, Venous: 1.5 mmol/L (ref 0.5–1.9)

## 2022-05-24 LAB — LIPASE, BLOOD: Lipase: <10 U/L — ABNORMAL LOW (ref 11–51)

## 2022-05-24 MED ORDER — MORPHINE SULFATE (PF) 2 MG/ML IV SOLN
2.0000 mg | INTRAVENOUS | Status: DC | PRN
  Administered 2022-05-24 – 2022-05-26 (×4): 2 mg via INTRAVENOUS
  Filled 2022-05-24 (×4): qty 1

## 2022-05-24 MED ORDER — HYDRALAZINE HCL 20 MG/ML IJ SOLN: 10.0000 mg | Freq: Three times a day (TID) | INTRAMUSCULAR | Status: DC | PRN

## 2022-05-24 MED ORDER — SODIUM CHLORIDE 0.9 % IV SOLN: INTRAVENOUS | Status: AC

## 2022-05-24 MED ORDER — LACTATED RINGERS IV BOLUS
1000.0000 mL | Freq: Once | INTRAVENOUS | Status: AC
  Administered 2022-05-24: 1000 mL via INTRAVENOUS

## 2022-05-24 MED ORDER — FENTANYL CITRATE PF 50 MCG/ML IJ SOSY
50.0000 ug | PREFILLED_SYRINGE | INTRAMUSCULAR | Status: DC | PRN
  Administered 2022-05-24 (×2): 50 ug via INTRAVENOUS
  Filled 2022-05-24 (×3): qty 1

## 2022-05-24 MED ORDER — DIATRIZOATE MEGLUMINE & SODIUM 66-10 % PO SOLN
90.0000 mL | Freq: Once | ORAL | Status: AC
  Administered 2022-05-24: 90 mL via NASOGASTRIC
  Filled 2022-05-24: qty 90

## 2022-05-24 MED ORDER — PROCHLORPERAZINE EDISYLATE 10 MG/2ML IJ SOLN
10.0000 mg | Freq: Four times a day (QID) | INTRAMUSCULAR | Status: DC | PRN
  Administered 2022-05-24 – 2022-05-26 (×3): 10 mg via INTRAVENOUS
  Filled 2022-05-24 (×3): qty 2

## 2022-05-24 MED ORDER — IOHEXOL 300 MG/ML  SOLN
100.0000 mL | Freq: Once | INTRAMUSCULAR | Status: AC | PRN
  Administered 2022-05-24: 85 mL via INTRAVENOUS

## 2022-05-24 MED ORDER — LIDOCAINE HCL URETHRAL/MUCOSAL 2 % EX GEL
1.0000 | Freq: Once | CUTANEOUS | Status: AC
  Administered 2022-05-24: 1
  Filled 2022-05-24: qty 11

## 2022-05-24 MED ORDER — LORAZEPAM 2 MG/ML IJ SOLN
0.5000 mg | Freq: Once | INTRAMUSCULAR | Status: AC
  Administered 2022-05-24: 0.5 mg via INTRAVENOUS
  Filled 2022-05-24: qty 1

## 2022-05-24 MED ORDER — ENOXAPARIN SODIUM 40 MG/0.4ML IJ SOSY
40.0000 mg | PREFILLED_SYRINGE | INTRAMUSCULAR | Status: DC
  Administered 2022-05-24 – 2022-05-30 (×7): 40 mg via SUBCUTANEOUS
  Filled 2022-05-24 (×7): qty 0.4

## 2022-05-24 MED ORDER — FENTANYL CITRATE PF 50 MCG/ML IJ SOSY
50.0000 ug | PREFILLED_SYRINGE | Freq: Once | INTRAMUSCULAR | Status: AC
  Administered 2022-05-24: 50 ug via INTRAVENOUS
  Filled 2022-05-24: qty 1

## 2022-05-24 MED ORDER — ONDANSETRON HCL 4 MG/2ML IJ SOLN
4.0000 mg | Freq: Once | INTRAMUSCULAR | Status: AC
  Administered 2022-05-24: 4 mg via INTRAVENOUS
  Filled 2022-05-24: qty 2

## 2022-05-24 NOTE — H&P (Addendum)
History and Physical    Patient: Troy [name] DOA: 05/24/2022 DOS: the patient was seen and examined on 05/24/2022 PCP: Derinda Late, MD  Patient coming from: Home  Chief Complaint:  Chief Complaint  Patient presents with   Abdominal Pain   HPI: Troy [name] is a 76 y.o. male with medical history significant of prostate CA, HLD, HTN. Presenting with nausea/vomiting and abdominal pain. His pain started 1 day ago. It was epigastric, non-radiating and a dull/crampy sensation. He felt bloated and have N/V. He tried APAP and zofran, but they didn't help much. His last BM was 2 days ago. Otherwise, he does not reports any symptoms. When his symptoms did not improve this morning, he decided to come to the ED for assistance. He denies any other aggravating or alleviating factors.      Review of Systems: As mentioned in the history of present illness. All other systems reviewed and are negative. Past Medical History:  Diagnosis Date   Abdominal distention    hernia   Anemia    Arthritis    big toes    Cancer (HCC)    prostate    GERD (gastroesophageal reflux disease)    Hypertension    Lumbar herniated disc    L5   Rash    yeast infection from bladder sling surgery    Sciatica of right side 09-01-11   right leg-tx. Tylenol   Past Surgical History:  Procedure Laterality Date   HERNIA REPAIR  September 01, 2010   INCONTINENCE SURGERY  Jan 31, 2011   INSERTION OF MESH  03/10/2015   Procedure: INSERTION OF MESH;  Surgeon: Johnathan Hausen, MD;  Location: [facility];  Service: General;;   IR RADIOLOGIST EVAL & MGMT  05/15/2018   LAPAROSCOPIC NISSEN FUNDOPLICATION N/A 3/54/6568   Procedure: LAPAROSCOPIC NISSEN FUNDOPLICATION AND HIATAL HERNIA REPAIR;  Surgeon: Johnathan Hausen, MD;  Location: [facility];  Service: General;  Laterality: N/A;   PROSTATECTOMY  reb 9, 2010   TONSILLECTOMY     UPPER GI ENDOSCOPY N/A 03/10/2015   Procedure: UPPER GI ENDOSCOPY;  Surgeon:  Johnathan Hausen, MD;  Location: [facility];  Service: General;  Laterality: N/A;   VENTRAL HERNIA REPAIR  09/06/2011   Procedure: HERNIA REPAIR VENTRAL ADULT;  Surgeon: Pedro Earls, MD;  Location: [facility];  Service: General;  Laterality: N/A;   VENTRAL HERNIA REPAIR  09/06/2011   Procedure: LAPAROSCOPIC VENTRAL HERNIA;  Surgeon: Pedro Earls, MD;  Location: [facility];  Service: General;  Laterality: N/A;   Social History:  reports that he has never smoked. He has never used smokeless tobacco. He reports current alcohol use. He reports that he does not use drugs.  Allergies  Allergen Reactions   Ciprofloxacin Diarrhea and Nausea And Vomiting    Can take Levaquin    Family History  Problem Relation Age of Onset   Lung cancer Mother    Coronary artery disease Mother    Stroke Father    Diabetes Father    Bladder Cancer Father    Dementia Father    Cervical cancer Sister     Prior to Admission medications   Medication Sig Start Date End Date Taking? Authorizing Provider  ALPRAZolam Duanne Moron) 0.5 MG tablet Take 0.5 mg by mouth at bedtime as needed for anxiety. For sleep.   Yes [provider]  Cholecalciferol (VITAMIN D3) 2000 UNITS TABS Take 1 tablet by mouth every morning.   Yes [provider]  ezetimibe (ZETIA) 10 MG tablet Take 10 mg by mouth at bedtime.   Yes [provider]  hydrochlorothiazide (HYDRODIURIL) 25 MG tablet Take 25 mg by mouth daily. 05/19/22  Yes [provider]  losartan (COZAAR) 100 MG tablet Take 100 mg by mouth every morning.   Yes [provider]  LUTEIN PO Take by mouth.   Yes [provider]  metoprolol succinate (TOPROL-XL) 50 MG 24 hr tablet Take 50 mg by mouth daily. Take with or immediately following a meal.   Yes [provider]  ondansetron (ZOFRAN-ODT) 4 MG disintegrating tablet Take by mouth. 05/23/22  Yes [provider]  rosuvastatin (CRESTOR) 20 MG tablet Take 20 mg by mouth at  bedtime.   Yes [provider]  Turmeric (QC TUMERIC COMPLEX PO) Take by mouth.   Yes [provider]    Physical Exam: Vitals:   05/24/22 1015 05/24/22 1230 05/24/22 1330 05/24/22 1501  BP: 134/87 (!) 115/98 116/72 (!) 143/80  Pulse: 74 88 87 87  Resp: '17 16 20 16  '$ Temp:  98.8 F (37.1 C)  98.4 F (36.9 C)  TempSrc:    Oral  SpO2: 95% 95% 95% 96%  Weight:      Height:       General: 76 y.o. male resting in bed in NAD Eyes: PERRL, normal sclera ENMT: Nares patent w/o discharge, orophaynx clear, dentition normal, ears w/o discharge/lesions/ulcers Neck: Supple, trachea midline Cardiovascular: RRR, +S1, S2, no m/g/r, equal pulses throughout Respiratory: CTABL, no w/r/r, normal WOB GI: BS+, epigastric TPP, mild distention but soft, no masses noted, no organomegaly noted MSK: No e/c/c Neuro: A&O x 3, no focal deficits Psyc: Appropriate interaction and affect, calm/cooperative  Data Reviewed:  Results for orders placed or performed during the hospital encounter of 05/24/22 (from the past 24 hour(s))  Lipase, blood     Status: Abnormal   Collection Time: 05/24/22  9:17 AM  Result Value Ref Range   Lipase <10 (L) 11 - 51 U/L  Comprehensive metabolic panel     Status: Abnormal   Collection Time: 05/24/22  9:17 AM  Result Value Ref Range   Sodium 138 135 - 145 mmol/L   Potassium 3.7 3.5 - 5.1 mmol/L   Chloride 100 98 - 111 mmol/L   CO2 25 22 - 32 mmol/L   Glucose, Bld 161 (H) 70 - 99 mg/dL   BUN 23 8 - 23 mg/dL   Creatinine, Ser 1.09 0.61 - 1.24 mg/dL   Calcium 10.2 8.9 - 10.3 mg/dL   Total Protein 7.8 6.5 - 8.1 g/dL   Albumin 4.8 3.5 - 5.0 g/dL   AST 14 (L) 15 - 41 U/L   ALT 17 0 - 44 U/L   Alkaline Phosphatase 70 38 - 126 U/L   Total Bilirubin 1.5 (H) 0.3 - 1.2 mg/dL   GFR, Estimated >60 >60 mL/min   Anion gap 13 5 - 15  CBC     Status: None   Collection Time: 05/24/22  9:17 AM  Result Value Ref Range   WBC 9.1 4.0 - 10.5 K/uL   RBC 5.16 4.22 -  5.81 MIL/uL   Hemoglobin 16.2 13.0 - 17.0 g/dL   HCT 45.9 39.0 - 52.0 %   MCV 89.0 80.0 - 100.0 fL   MCH 31.4 26.0 - 34.0 pg   MCHC 35.3 30.0 - 36.0 g/dL   RDW 12.5 11.5 - 15.5 %   Platelets 326 150 - 400 K/uL  nRBC 0.0 0.0 - 0.2 %  Lactic acid, plasma     Status: Abnormal   Collection Time: 05/24/22  9:17 AM  Result Value Ref Range   Lactic Acid, Venous 2.3 (HH) 0.5 - 1.9 mmol/L  Resp Panel by RT-PCR (Flu A&B, Covid) Anterior Nasal Swab     Status: None   Collection Time: 05/24/22 10:06 AM   Specimen: Anterior Nasal Swab  Result Value Ref Range   SARS Coronavirus 2 by RT PCR NEGATIVE NEGATIVE   Influenza A by PCR NEGATIVE NEGATIVE   Influenza B by PCR NEGATIVE NEGATIVE  Urinalysis, Routine w reflex microscopic Urine, Clean Catch     Status: Abnormal   Collection Time: 05/24/22 10:40 AM  Result Value Ref Range   Color, Urine COLORLESS (A) YELLOW   APPearance CLEAR CLEAR   Specific Gravity, Urine 1.028 1.005 - 1.030   pH 6.0 5.0 - 8.0   Glucose, UA NEGATIVE NEGATIVE mg/dL   Hgb urine dipstick NEGATIVE NEGATIVE   Bilirubin Urine NEGATIVE NEGATIVE   Ketones, ur NEGATIVE NEGATIVE mg/dL   Protein, ur NEGATIVE NEGATIVE mg/dL   Nitrite NEGATIVE NEGATIVE   Leukocytes,Ua NEGATIVE NEGATIVE  Lactic acid, plasma     Status: Abnormal   Collection Time: 05/24/22 11:19 AM  Result Value Ref Range   Lactic Acid, Venous 2.5 (HH) 0.5 - 1.9 mmol/L   CT ab/pelvis 1. High-grade Small-bowel Obstruction. Scattered free fluid in the abdomen and pelvis. Abrupt transition point in the mid abdomen on series 2, image 56. Main differential considerations are obstruction due to adhesion or internal hernia. Recommend Surgery consultation. No pneumoperitoneum. 2. Appearance suspicious for both a 1.8 cm left posterior Bladder Wall Mass (series 2, image 84), and a small new solid right renal lower pole mass (1.2 cm series 5, image 41). Consider Bladder and Renal Cell Carcinoma. Recommend follow-up with  Urology. 3. A chronic right inguinal hernia now contains free fluid but there is no herniated bowel loop. 4. Chronic cholelithiasis without CT evidence of acute cholecystitis. Extensive diverticulosis of distal colon. Aortic Atherosclerosis (ICD10-I70.0).  KUB 1. Enteric tube above the diaphragm in the distal esophagus. Recommend advancement. 2. Unchanged small bowel obstruction.  Assessment and Plan: SBO     - admit to inpt, med-surg     - CT as above     - SBO protocol, NGT in place     - general surgery onboard, appreciate assistance     - NPO for now     - fluids, anti-emetics, pain control     - rpt lactic acid  HTN     - PRN hydralazine while NPO     - resume home regimen when off NPO status  Anxiety     - resume home regimen when off NPO status  HLD     - resume home regimen when off NPO status  New Bladder and right renal masses     - follows w/ [facility]     - will need w/u with them at discharge  Advance Care Planning:   Code Status: FULL  Consults: CCS  Family Communication: w/ wife at bedside  Severity of Illness: The appropriate patient status for this patient is INPATIENT. Inpatient status is judged to be reasonable and necessary in order to provide the required intensity of service to ensure the patient's safety. The patient's presenting symptoms, physical exam findings, and initial radiographic and laboratory data in the context of their chronic comorbidities is felt to place them  at high risk for further clinical deterioration. Furthermore, it is not anticipated that the patient will be medically stable for discharge from the hospital within 2 midnights of admission.   * I certify that at the point of admission it is my clinical judgment that the patient will require inpatient hospital care spanning beyond 2 midnights from the point of admission due to high intensity of service, high risk for further deterioration and high frequency of surveillance  required.*  Time spent in coordination of this H&P: 30 minutes  Author: Jonnie Finner, DO 05/24/2022 3:41 PM  For on call review [email or web address].

## 2022-05-24 NOTE — ED Triage Notes (Signed)
Patient reports to the ER for abdominal pain. Patient reports he has had it since Monday night. Patient has a hx of diverticulitis. Patient reports he had perforations last time and was hospitalized. Patient endorses nausea. Endorses some dry heaves. Patient reports LLQ pain.

## 2022-05-24 NOTE — Plan of Care (Signed)

## 2022-05-24 NOTE — ED Notes (Signed)
[facility] called for transport to [facility]

## 2022-05-24 NOTE — ED Provider Notes (Signed)
[facility] EMERGENCY DEPT Provider Note   CSN: [id number] Arrival date & time: 05/24/22  0901     History  Chief Complaint  Patient presents with   Abdominal Pain    Troy [name] is a 76 y.o. male.   Abdominal Pain    76 year old male with medical history significant for GERD, hypertension, prostate cancer status post robotic assisted prostatectomy complicated by abdominal wall ventral hernia status post repair with mesh, UroLift surgery for incontinence, laparoscopic Niesen fundoplication in 9811 who presents to the emergency department with abdominal pain.  The patient states that he developed it Monday night when it woke him from sleep.  He states that he has not passed gas in the last 24 hours.  He endorses abdominal pain transversely across his mid abdomen with associated nausea.  No radiation of the pain.  Has been retching but has not had much oral intake.  Has not had a bowel movement since the pain started.  Denies any fever, chills, cough, chest pain, shortness of breath.  He denies any dysuria or increased urinary frequency.  Home Medications Prior to Admission medications   Medication Sig Start Date End Date Taking? Authorizing Provider  ALPRAZolam Duanne Moron) 0.5 MG tablet Take 0.5 mg by mouth at bedtime as needed for anxiety. For sleep.   Yes [provider]  Cholecalciferol (VITAMIN D3) 2000 UNITS TABS Take 1 tablet by mouth every morning.   Yes [provider]  ezetimibe (ZETIA) 10 MG tablet Take 10 mg by mouth at bedtime.   Yes [provider]  hydrochlorothiazide (HYDRODIURIL) 25 MG tablet Take 25 mg by mouth daily. 05/19/22  Yes [provider]  losartan (COZAAR) 100 MG tablet Take 100 mg by mouth every morning.   Yes [provider]  LUTEIN PO Take by mouth.   Yes [provider]  metoprolol succinate (TOPROL-XL) 50 MG 24 hr tablet Take 50 mg by mouth daily. Take with or immediately following a meal.    Yes [provider]  ondansetron (ZOFRAN-ODT) 4 MG disintegrating tablet Take by mouth. 05/23/22  Yes [provider]  rosuvastatin (CRESTOR) 20 MG tablet Take 20 mg by mouth at bedtime.   Yes [provider]  Turmeric (QC TUMERIC COMPLEX PO) Take by mouth.   Yes [provider]      Allergies    Ciprofloxacin    Review of Systems   Review of Systems  Gastrointestinal:  Positive for abdominal pain.  All other systems reviewed and are negative.   Physical Exam Updated Vital Signs BP 134/87   Pulse 74   Temp 98.5 F (36.9 C) (Oral)   Resp 17   Ht '5\' 9"'$  (1.753 m)   Wt 106.6 kg   SpO2 95%   BMI 34.70 kg/m  Physical Exam Vitals and nursing note reviewed.  Constitutional:      General: He is not in acute distress.    Appearance: He is well-developed.  HENT:     Head: Normocephalic and atraumatic.  Eyes:     Conjunctiva/sclera: Conjunctivae normal.  Cardiovascular:     Rate and Rhythm: Normal rate and regular rhythm.     Heart sounds: No murmur heard. Pulmonary:     Effort: Pulmonary effort is normal. No respiratory distress.     Breath sounds: Normal breath sounds.  Abdominal:     Palpations: Abdomen is soft.     Tenderness: There is generalized abdominal tenderness. There is no guarding or rebound.  Musculoskeletal:  General: No swelling.     Cervical back: Neck supple.  Skin:    General: Skin is warm and dry.     Capillary Refill: Capillary refill takes less than 2 seconds.  Neurological:     Mental Status: He is alert.  Psychiatric:        Mood and Affect: Mood normal.     ED Results / Procedures / Treatments   Labs (all labs ordered are listed, but only abnormal results are displayed) Labs Reviewed  LIPASE, BLOOD - Abnormal; Notable for the following components:      Result Value   Lipase <10 (*)    All other components within normal limits  COMPREHENSIVE METABOLIC PANEL - Abnormal; Notable for the following  components:   Glucose, Bld 161 (*)    AST 14 (*)    Total Bilirubin 1.5 (*)    All other components within normal limits  URINALYSIS, ROUTINE W REFLEX MICROSCOPIC - Abnormal; Notable for the following components:   Color, Urine COLORLESS (*)    All other components within normal limits  LACTIC ACID, PLASMA - Abnormal; Notable for the following components:   Lactic Acid, Venous 2.3 (*)    All other components within normal limits  RESP PANEL BY RT-PCR (FLU A&B, COVID) [id number]  CBC  LACTIC ACID, PLASMA    EKG None  Radiology DG Abd Portable 1 View  Result Date: 05/24/2022 CLINICAL DATA:  Enteric tube placement. EXAM: PORTABLE ABDOMEN - 1 VIEW COMPARISON:  CT abdomen pelvis from same day. Abdominal x-ray dated July 10, 2014. FINDINGS: Enteric tube above the diaphragm in the distal esophagus. Unchanged dilated small bowel loops in the visualized upper abdomen. IMPRESSION: 1. Enteric tube above the diaphragm in the distal esophagus. Recommend advancement. 2. Unchanged small bowel obstruction. Electronically Signed   By: Titus Dubin M.D.   On: 05/24/2022 11:32   CT ABDOMEN PELVIS W CONTRAST  Result Date: 05/24/2022 CLINICAL DATA:  76 year old male with left lower quadrant pain for 2 days. History of diverticulitis. EXAM: CT ABDOMEN AND PELVIS WITH CONTRAST TECHNIQUE: Multidetector CT imaging of the abdomen and pelvis was performed using the standard protocol following bolus administration of intravenous contrast. RADIATION DOSE REDUCTION: This exam was performed according to the departmental dose-optimization program which includes automated exposure control, adjustment of the mA and/or kV according to patient size and/or use of iterative reconstruction technique. CONTRAST:  13m OMNIPAQUE IOHEXOL 300 MG/ML  SOLN COMPARISON:  CT Abdomen and Pelvis 06/19/2018. FINDINGS: Lower chest: Stable tortuous descending thoracic aorta. No pericardial or pleural effusion. Mild lung base atelectasis or  scarring. Hepatobiliary: Trace perihepatic free fluid. Small chronic gallstones. No CT evidence of acute cholecystitis. No bile duct enlargement. Pancreas: Fatty atrophied. Spleen: Negative. Adrenals/Urinary Tract: Adrenal glands and left kidney are stable and negative. Right renal lower pole seemingly enhancing exophytic nodule measuring 12 mm on series 5, image 41. A tiny low-density area was present here in 2019. Symmetric renal enhancement and contrast excretion. Decompressed bladder. However, there is an abnormal 1.8 cm lobulated bladder hyperdensity along the left posterior wall near the left UVJ. No distal ureteral dilatation. See series 2, image 84 and series 5, image 41. Stomach/Bowel: Mostly decompressed rectosigmoid colon. Extensive sigmoid diverticulosis. Extensive diverticulosis in the descending colon which is decompressed. Small volume of simple density free fluid in the left gutter. Decompressed transverse colon. Mild retained gas and stool in the right colon. Small volume of simple density fluid in the right pericolic gutter. Appendix appears to  remain normal on coronal image 69. No large bowel inflammation. The terminal ileum is decompressed. There are multiple decompressed loops of small bowel in the anterior pelvis. Upstream small bowel obstruction with dilated air in fluid-filled loops up to 37 mm diameter beginning in the proximal jejunum and tracking distally. There is an abrupt transition in the mid abdomen on series 2, image 56. In the immediate upstream loops are among the most inflamed. Note sigmoid mesenteric congestion on series 2, image 48. small volume of free fluid in the affected small bowel mesentery. No pneumoperitoneum. Chronic postoperative changes to the gastroesophageal junction. Duodenum and ligament of Treitz are decompressed. Vascular/Lymphatic: Aortoiliac calcified atherosclerosis. Major arterial structures in the abdomen and pelvis are patent. No lymphadenopathy  identified. Reproductive: Chronic right inguinal hernia now contains a small volume of free fluid in addition to mesenteric fat. But no herniated bowel loop. Other: Small volume of free fluid in the pelvis has simple fluid density. Musculoskeletal: Progression of lumbar spine degeneration since 2019. New vacuum disc and progressed endplate degeneration at L3-L4. No acute osseous abnormality identified. IMPRESSION: 1. High-grade Small-bowel Obstruction. Scattered free fluid in the abdomen and pelvis. Abrupt transition point in the mid abdomen on series 2, image 56. Main differential considerations are obstruction due to adhesion or internal hernia. Recommend Surgery consultation. No pneumoperitoneum. 2. Appearance suspicious for both a 1.8 cm left posterior Bladder Wall Mass (series 2, image 84), and a small new solid right renal lower pole mass (1.2 cm series 5, image 41). Consider Bladder and Renal Cell Carcinoma. Recommend follow-up with Urology. 3. A chronic right inguinal hernia now contains free fluid but there is no herniated bowel loop. 4. Chronic cholelithiasis without CT evidence of acute cholecystitis. Extensive diverticulosis of distal colon. Aortic Atherosclerosis (ICD10-I70.0). Electronically Signed   By: Genevie Ann M.D.   On: 05/24/2022 10:48    Procedures Procedures    Medications Ordered in ED Medications  fentaNYL (SUBLIMAZE) injection 50 mcg (50 mcg Intravenous Given 05/24/22 1106)  0.9 %  sodium chloride infusion (has no administration in time range)  fentaNYL (SUBLIMAZE) injection 50 mcg (50 mcg Intravenous Given 05/24/22 0933)  lactated ringers bolus 1,000 mL (1,000 mLs Intravenous New Bag/Given 05/24/22 0930)  ondansetron (ZOFRAN) injection 4 mg (4 mg Intravenous Given 05/24/22 0932)  iohexol (OMNIPAQUE) 300 MG/ML solution 100 mL (85 mLs Intravenous Contrast Given 05/24/22 1025)  lidocaine (XYLOCAINE) 2 % jelly 1 Application (1 Application Other Given 05/24/22 1107)    ED Course/  Medical Decision Making/ A&P                           Medical Decision Making Amount and/or Complexity of Data Reviewed Labs: ordered. Radiology: ordered.  Risk Prescription drug management. Decision regarding hospitalization.   76 year old male with medical history significant for GERD, hypertension, prostate cancer status post robotic assisted prostatectomy complicated by abdominal wall ventral hernia status post repair with mesh, UroLift surgery for incontinence, laparoscopic Niesen fundoplication in 8315 who presents to the emergency department with abdominal pain.  The patient states that he developed it Monday night when it woke him from sleep.  He states that he has not passed gas in the last 24 hours.  He endorses abdominal pain transversely across his mid abdomen with associated nausea.  No radiation of the pain.  Has been retching but has not had much oral intake.  Has not had a bowel movement since the pain started.  Denies any  fever, chills, cough, chest pain, shortness of breath.  He denies any dysuria or increased urinary frequency.  Vitals and telemetry on arrival: Afebrile, not tachycardic or tachypneic, BP 140/96, O2 sats 96% on room air.  Sinus rhythm noted on cardiac telemetry  Pertinent exam findings include: Generalized abdominal tenderness in the mid abdomen, no rebound or guarding, abdomen overall soft.  Differential Diagnosis: Most likely small bowel obstruction, also considered recurrent diverticulitis.  Considered appendicitis, AAA, ACS, pneumonia, pneumothorax, Pyelonephritis, Nephrolithiasis, Pancreatitis, Cholecystitis, Shingles, Perforated Bowel or Ulcer, Ischemic Mesentery, Inflammatory Bowel Disease, Strangulated/Incarcerated Hernia, gastritis, PUD.    Lab results include: CMP with mild hyperglycemia to 161, mildly elevated T. bili to 1.5, otherwise unremarkable, mild lactic acidosis to 2.3, repeat pending, CBC without a leukocytosis or anemia, lipase normal,  urinalysis unremarkable, COVID-19 and influenza PCR testing negative.  Imaging results include: CT Abdomen Pelvis IMPRESSION:  1. High-grade Small-bowel Obstruction. Scattered free fluid in the  abdomen and pelvis. Abrupt transition point in the mid abdomen on  series 2, image 56. Main differential considerations are obstruction  due to adhesion or internal hernia. Recommend Surgery consultation.  No pneumoperitoneum.    2. Appearance suspicious for both a 1.8 cm left posterior Bladder  Wall Mass (series 2, image 84), and a small new solid right renal  lower pole mass (1.2 cm series 5, image 41). Consider Bladder and  Renal Cell Carcinoma. Recommend follow-up with Urology.    3. A chronic right inguinal hernia now contains free fluid but there  is no herniated bowel loop.    4. Chronic cholelithiasis without CT evidence of acute  cholecystitis. Extensive diverticulosis of distal colon. Aortic  Atherosclerosis (ICD10-I70.0).    Course of tx has consisted of: The patient was administered IV fentanyl, IV Zofran, and IV fluid bolus for volume resuscitation, antiemesis and pain control.  Consult: Surgery was consulted due to concern for high-grade small bowel obstruction with a transition point.  Recommended admission to medicine and will see the patient in consultation.  NG tube was placed.  X-ray imaging revealed that the NG was in the esophagus.  It was subsequently advanced.  The patient was also informed bedside regarding incidental findings of masses on the left posterior bladder wall and a new solid right renal lower pole mass.  Patient will need urology follow-up.  He was updated regarding the plan for inpatient hospitalization, need for surgical consultation.  Presentation more consistent with small bowel obstruction.  Subsequently admitted in stable condition.   Final Clinical Impression(s) / ED Diagnoses Final diagnoses:  SBO (small bowel obstruction) ([place])  Renal mass   Bladder mass    Rx / DC Orders ED Discharge Orders     None         Regan Lemming, MD 05/24/22 1159

## 2022-05-24 NOTE — Consult Note (Signed)
Troy [name] 09-29-1945  [id number].    Requesting MD: Marylyn Ishihara, MD Chief Complaint/Reason for Consult: SBO  HPI:  Troy [name] is a 76 year old male with a past medical history of hypertension, prostate cancer s/p prostatectomy in 2010, left knee arthritis, diverticulitis and multiple abdominal surgeries who presents with a chief complaint of acute onset abdominal pain.  Patient states upper abdominal pain woke him up from his sleep early Tuesday morning.  Pain described as constant, dull, nonradiating.  Took Tylenol without relief of symptoms.  Denies similar pain in the past.  Associated symptoms include nausea and vomiting.  Reports his last episode of flatus and his last bowel movement was on Monday.  At baseline he reports daily bowel movements in the morning, occasionally takes Colace.  Patient denies tobacco use.  Reports occasional alcohol use.  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  His wife is at the bedside.  Patient denies fever, chills, sick contacts, recent travel, or urinary symptoms.  Denies blood in his stool.  Past abdominal surgeries include: Nissen fundoplication + HH repair with mesh, laparoscopic ventral hernia repair with mesh 2012, also reports insertion of bladder sling/mesh due to urinary incontinence after prostatectomy.   ROS: As above Review of Systems  All other systems reviewed and are negative.   Family History  Problem Relation Age of Onset   Lung cancer Mother    Coronary artery disease Mother    Stroke Father    Diabetes Father    Bladder Cancer Father    Dementia Father    Cervical cancer Sister     Past Medical History:  Diagnosis Date   Abdominal distention    hernia   Anemia    Arthritis    big toes    Cancer ([place])    prostate    GERD (gastroesophageal reflux disease)    Hypertension    Lumbar herniated disc    L5   Rash    yeast infection from bladder sling surgery    Sciatica of right side 09-01-11   right  leg-tx. Tylenol    Past Surgical History:  Procedure Laterality Date   HERNIA REPAIR  September 01, 2010   INCONTINENCE SURGERY  Jan 31, 2011   INSERTION OF MESH  03/10/2015   Procedure: INSERTION OF MESH;  Surgeon: Johnathan Hausen, MD;  Location: [facility];  Service: General;;   IR RADIOLOGIST EVAL & MGMT  05/15/2018   LAPAROSCOPIC NISSEN FUNDOPLICATION N/A 5/63/8756   Procedure: LAPAROSCOPIC NISSEN FUNDOPLICATION AND HIATAL HERNIA REPAIR;  Surgeon: Johnathan Hausen, MD;  Location: [facility];  Service: General;  Laterality: N/A;   PROSTATECTOMY  reb 9, 2010   TONSILLECTOMY     UPPER GI ENDOSCOPY N/A 03/10/2015   Procedure: UPPER GI ENDOSCOPY;  Surgeon: Johnathan Hausen, MD;  Location: [facility];  Service: General;  Laterality: N/A;   VENTRAL HERNIA REPAIR  09/06/2011   Procedure: HERNIA REPAIR VENTRAL ADULT;  Surgeon: Pedro Earls, MD;  Location: [facility];  Service: General;  Laterality: N/A;   VENTRAL HERNIA REPAIR  09/06/2011   Procedure: LAPAROSCOPIC VENTRAL HERNIA;  Surgeon: Pedro Earls, MD;  Location: [facility];  Service: General;  Laterality: N/A;    Social History:  reports that he has never smoked. He has never used smokeless tobacco. He reports current alcohol use. He reports that he does not use drugs.  Allergies:  Allergies  Allergen Reactions   Ciprofloxacin Diarrhea and Nausea And Vomiting  Can take Levaquin    Medications Prior to Admission  Medication Sig Dispense Refill   ALPRAZolam (XANAX) 0.5 MG tablet Take 0.5 mg by mouth at bedtime as needed for anxiety. For sleep.     Cholecalciferol (VITAMIN D3) 2000 UNITS TABS Take 1 tablet by mouth every morning.     ezetimibe (ZETIA) 10 MG tablet Take 10 mg by mouth at bedtime.     hydrochlorothiazide (HYDRODIURIL) 25 MG tablet Take 25 mg by mouth daily.     losartan (COZAAR) 100 MG tablet Take 100 mg by mouth every morning.     LUTEIN PO Take by mouth.     metoprolol succinate (TOPROL-XL) 50 MG 24 hr tablet Take 50 mg by mouth  daily. Take with or immediately following a meal.     ondansetron (ZOFRAN-ODT) 4 MG disintegrating tablet Take by mouth.     rosuvastatin (CRESTOR) 20 MG tablet Take 20 mg by mouth at bedtime.     Turmeric (QC TUMERIC COMPLEX PO) Take by mouth.       Physical Exam: Blood pressure (!) 143/80, pulse 87, temperature 98.4 F (36.9 C), temperature source Oral, resp. rate 16, height '5\' 9"'$  (1.753 m), weight 106.6 kg, SpO2 96 %. General: Pleasant white male laying on hospital bed, appears stated age, NAD. HEENT: head -normocephalic, atraumatic; Eyes: PERRLA, anicteric sclerae; NG tube in place Neck- Trachea is midline CV- RRR, mild bilateral lower extremity edema Pulm- breathing is non-labored on room air, rate and effort normal Abd- soft, protuberant, mildly distended, there is tenderness to palpation just superior to the umbilicus without rebound tenderness, previous midline laparotomy scar appears well-healing, no palpable hernias. GU- deferred  MSK- UE/LE symmetrical, no cyanosis, clubbing, or edema. Neuro-nonfocal exam, gait not assessed Psych- Alert and Oriented x3 with appropriate affect Skin: warm and dry, no rashes or lesions   Results for orders placed or performed during the hospital encounter of 05/24/22 (from the past 48 hour(s))  Lipase, blood     Status: Abnormal   Collection Time: 05/24/22  9:17 AM  Result Value Ref Range   Lipase <10 (L) 11 - 51 U/L    Comment: Performed at [facility], [place], [place] [address]  Comprehensive metabolic panel     Status: Abnormal   Collection Time: 05/24/22  9:17 AM  Result Value Ref Range   Sodium 138 135 - 145 mmol/L   Potassium 3.7 3.5 - 5.1 mmol/L   Chloride 100 98 - 111 mmol/L   CO2 25 22 - 32 mmol/L   Glucose, Bld 161 (H) 70 - 99 mg/dL    Comment: Glucose reference range applies only to samples taken after fasting for at least 8 hours.   BUN 23 8 - 23 mg/dL   Creatinine, Ser 1.09 0.61 - 1.24  mg/dL   Calcium 10.2 8.9 - 10.3 mg/dL   Total Protein 7.8 6.5 - 8.1 g/dL   Albumin 4.8 3.5 - 5.0 g/dL   AST 14 (L) 15 - 41 U/L   ALT 17 0 - 44 U/L   Alkaline Phosphatase 70 38 - 126 U/L   Total Bilirubin 1.5 (H) 0.3 - 1.2 mg/dL   GFR, Estimated >60 >60 mL/min    Comment: (NOTE) Calculated using the CKD-EPI Creatinine Equation (2021)    Anion gap 13 5 - 15    Comment: Performed at [facility], [address] [place], [place] [address]  CBC     Status: None   Collection  Time: 05/24/22  9:17 AM  Result Value Ref Range   WBC 9.1 4.0 - 10.5 K/uL   RBC 5.16 4.22 - 5.81 MIL/uL   Hemoglobin 16.2 13.0 - 17.0 g/dL   HCT 45.9 39.0 - 52.0 %   MCV 89.0 80.0 - 100.0 fL   MCH 31.4 26.0 - 34.0 pg   MCHC 35.3 30.0 - 36.0 g/dL   RDW 12.5 11.5 - 15.5 %   Platelets 326 150 - 400 K/uL   nRBC 0.0 0.0 - 0.2 %    Comment: Performed at [facility], [address] [place], [place] [address]  Lactic acid, plasma     Status: Abnormal   Collection Time: 05/24/22  9:17 AM  Result Value Ref Range   Lactic Acid, Venous 2.3 (HH) 0.5 - 1.9 mmol/L    Comment: CRITICAL RESULT CALLED TO, READ BACK BY AND VERIFIED WITH: CALLED TO KAITLIN ZUELETA '@1013'$  05/24/22 KLJ Performed at [facility], [address] [place], [place] [address]   Resp Panel by RT-PCR (Flu A&B, Covid) Anterior Nasal Swab     Status: None   Collection Time: 05/24/22 10:06 AM   Specimen: Anterior Nasal Swab  Result Value Ref Range   SARS Coronavirus 2 by RT PCR NEGATIVE NEGATIVE    Comment: (NOTE) SARS-CoV-2 target nucleic acids are NOT DETECTED.  The SARS-CoV-2 RNA is generally detectable in upper respiratory specimens during the acute phase of infection. The lowest concentration of SARS-CoV-2 viral copies this assay can detect is 138 copies/mL. A negative result does not preclude SARS-Cov-2 infection and should not be used as the sole basis for treatment or other patient  management decisions. A negative result may occur with  improper specimen collection/handling, submission of specimen other than nasopharyngeal swab, presence of viral mutation(s) within the areas targeted by this assay, and inadequate number of viral copies(<138 copies/mL). A negative result must be combined with clinical observations, patient history, and epidemiological information. The expected result is Negative.  Fact Sheet for Patients:  EntrepreneurPulse.com.au  Fact Sheet for Healthcare Providers:  IncredibleEmployment.be  This test is no t yet approved or cleared by the [place] FDA and  has been authorized for detection and/or diagnosis of SARS-CoV-2 by FDA under an Emergency Use Authorization (EUA). This EUA will remain  in effect (meaning this test can be used) for the duration of the COVID-19 declaration under Section 564(b)(1) of the Act, 21 U.S.C.section 360bbb-3(b)(1), unless the authorization is terminated  or revoked sooner.       Influenza A by PCR NEGATIVE NEGATIVE   Influenza B by PCR NEGATIVE NEGATIVE    Comment: (NOTE) The Xpert Xpress SARS-CoV-2/FLU/RSV plus assay is intended as an aid in the diagnosis of influenza from Nasopharyngeal swab specimens and should not be used as a sole basis for treatment. Nasal washings and aspirates are unacceptable for Xpert Xpress SARS-CoV-2/FLU/RSV testing.  Fact Sheet for Patients: EntrepreneurPulse.com.au  Fact Sheet for Healthcare Providers: IncredibleEmployment.be  This test is not yet approved or cleared by the [place] FDA and has been authorized for detection and/or diagnosis of SARS-CoV-2 by FDA under an Emergency Use Authorization (EUA). This EUA will remain in effect (meaning this test can be used) for the duration of the COVID-19 declaration under Section 564(b)(1) of the Act, 21 U.S.C. section 360bbb-3(b)(1), unless the  authorization is terminated or revoked.  Performed at [facility], [address] [place], [place] [address]   Urinalysis, Routine w reflex microscopic Urine, Clean Catch  Status: Abnormal   Collection Time: 05/24/22 10:40 AM  Result Value Ref Range   Color, Urine COLORLESS (A) YELLOW   APPearance CLEAR CLEAR   Specific Gravity, Urine 1.028 1.005 - 1.030   pH 6.0 5.0 - 8.0   Glucose, UA NEGATIVE NEGATIVE mg/dL   Hgb urine dipstick NEGATIVE NEGATIVE   Bilirubin Urine NEGATIVE NEGATIVE   Ketones, ur NEGATIVE NEGATIVE mg/dL   Protein, ur NEGATIVE NEGATIVE mg/dL   Nitrite NEGATIVE NEGATIVE   Leukocytes,Ua NEGATIVE NEGATIVE    Comment: Performed at [facility], [address] [place], [place] [address]  Lactic acid, plasma     Status: Abnormal   Collection Time: 05/24/22 11:19 AM  Result Value Ref Range   Lactic Acid, Venous 2.5 (HH) 0.5 - 1.9 mmol/L    Comment: CRITICAL RESULT CALLED TO, READ BACK BY AND VERIFIED WITH: Radene Journey, RN [facility] 05/24/22 REPEATED TO VERIFY Performed at [facility], [place], [place] [address]    DG Abd Portable 1 View  Result Date: 05/24/2022 CLINICAL DATA:  Enteric tube placement. EXAM: PORTABLE ABDOMEN - 1 VIEW COMPARISON:  CT abdomen pelvis from same day. Abdominal x-ray dated July 10, 2014. FINDINGS: Enteric tube above the diaphragm in the distal esophagus. Unchanged dilated small bowel loops in the visualized upper abdomen. IMPRESSION: 1. Enteric tube above the diaphragm in the distal esophagus. Recommend advancement. 2. Unchanged small bowel obstruction. Electronically Signed   By: Titus Dubin M.D.   On: 05/24/2022 11:32   CT ABDOMEN PELVIS W CONTRAST  Result Date: 05/24/2022 CLINICAL DATA:  76 year old male with left lower quadrant pain for 2 days. History of diverticulitis. EXAM: CT ABDOMEN AND PELVIS WITH CONTRAST TECHNIQUE: Multidetector CT imaging of  the abdomen and pelvis was performed using the standard protocol following bolus administration of intravenous contrast. RADIATION DOSE REDUCTION: This exam was performed according to the departmental dose-optimization program which includes automated exposure control, adjustment of the mA and/or kV according to patient size and/or use of iterative reconstruction technique. CONTRAST:  33m OMNIPAQUE IOHEXOL 300 MG/ML  SOLN COMPARISON:  CT Abdomen and Pelvis 06/19/2018. FINDINGS: Lower chest: Stable tortuous descending thoracic aorta. No pericardial or pleural effusion. Mild lung base atelectasis or scarring. Hepatobiliary: Trace perihepatic free fluid. Small chronic gallstones. No CT evidence of acute cholecystitis. No bile duct enlargement. Pancreas: Fatty atrophied. Spleen: Negative. Adrenals/Urinary Tract: Adrenal glands and left kidney are stable and negative. Right renal lower pole seemingly enhancing exophytic nodule measuring 12 mm on series 5, image 41. A tiny low-density area was present here in 2019. Symmetric renal enhancement and contrast excretion. Decompressed bladder. However, there is an abnormal 1.8 cm lobulated bladder hyperdensity along the left posterior wall near the left UVJ. No distal ureteral dilatation. See series 2, image 84 and series 5, image 41. Stomach/Bowel: Mostly decompressed rectosigmoid colon. Extensive sigmoid diverticulosis. Extensive diverticulosis in the descending colon which is decompressed. Small volume of simple density free fluid in the left gutter. Decompressed transverse colon. Mild retained gas and stool in the right colon. Small volume of simple density fluid in the right pericolic gutter. Appendix appears to remain normal on coronal image 69. No large bowel inflammation. The terminal ileum is decompressed. There are multiple decompressed loops of small bowel in the anterior pelvis. Upstream small bowel obstruction with dilated air in fluid-filled loops up to 37 mm  diameter beginning in the proximal jejunum and tracking distally. There is an abrupt transition in the mid abdomen on  series 2, image 56. In the immediate upstream loops are among the most inflamed. Note sigmoid mesenteric congestion on series 2, image 48. small volume of free fluid in the affected small bowel mesentery. No pneumoperitoneum. Chronic postoperative changes to the gastroesophageal junction. Duodenum and ligament of Treitz are decompressed. Vascular/Lymphatic: Aortoiliac calcified atherosclerosis. Major arterial structures in the abdomen and pelvis are patent. No lymphadenopathy identified. Reproductive: Chronic right inguinal hernia now contains a small volume of free fluid in addition to mesenteric fat. But no herniated bowel loop. Other: Small volume of free fluid in the pelvis has simple fluid density. Musculoskeletal: Progression of lumbar spine degeneration since 2019. New vacuum disc and progressed endplate degeneration at L3-L4. No acute osseous abnormality identified. IMPRESSION: 1. High-grade Small-bowel Obstruction. Scattered free fluid in the abdomen and pelvis. Abrupt transition point in the mid abdomen on series 2, image 56. Main differential considerations are obstruction due to adhesion or internal hernia. Recommend Surgery consultation. No pneumoperitoneum. 2. Appearance suspicious for both a 1.8 cm left posterior Bladder Wall Mass (series 2, image 84), and a small new solid right renal lower pole mass (1.2 cm series 5, image 41). Consider Bladder and Renal Cell Carcinoma. Recommend follow-up with Urology. 3. A chronic right inguinal hernia now contains free fluid but there is no herniated bowel loop. 4. Chronic cholelithiasis without CT evidence of acute cholecystitis. Extensive diverticulosis of distal colon. Aortic Atherosclerosis (ICD10-I70.0). Electronically Signed   By: Genevie Ann M.D.   On: 05/24/2022 10:48      Assessment/Plan Small bowel obstruction, suspect related to  intra-abdominal adhesions - Afebrile, vital stable, no leukocytosis - CT scan of the abdomen and pelvis shows a transition point in the mid abdomen, radiologist raises concern for possible internal hernia   Clinically, the patient does not have signs or symptoms of bowel ischemia-his heart rate and vitals are normal, he has no leukocytosis, his abdomen is minimally tender on exam without rebound tenderness.  His lactate is slightly elevated at 2.5, recommend IV fluid resuscitation and repeat of lactate in the morning. - Recommend SBO protocol.  No emergent surgery indicated at this point. [facility] will follow closely  FEN -n.p.o., IV fluids, NG decompression VTE - SCD's, ok for DVT ppx from CCS standpoint ID -none indicated Admit -TRH service   HTN HLD PMH Prostate CA Incidental finding of a small bladder mass and small renal mass on CT, will need outpatient follow-up with urology  I reviewed nursing notes, hospitalist notes, last 24 h vitals and pain scores, last 48 h intake and output, last 24 h labs and trends, and last 24 h imaging results.  Jill Alexanders, [facility] 05/24/2022, 3:55 PM Please see Amion for pager number during day hours 7:00am-4:30pm or 7:00am -11:30am on weekends

## 2022-05-24 NOTE — ED Notes (Signed)
Patient given warm blanket. Pain assessed to be a 3/4 out of 10

## 2022-05-24 NOTE — Progress Notes (Signed)
Plan of Care Note for accepted transfer   Patient: Troy [name] MRN: [id number]   DOA: 05/24/2022  Facility requesting transfer: [facility] Requesting Provider: Dr. Armandina Gemma Reason for transfer: SBO Facility course: 76 yo M w/ history of prostate CA, HLD, HTN. Presenting w/ N and abdominal pain. W/u revealed high-grade SBO. NGT w/ placed. SBO protocol started. EDP spoke with general surgery. Rec'd transfer to [facility] and will follow in consult. Noted to have new bladder and renal mass during his w/u. Will need follow up with urology (previously seen w/ [facility]).   Plan of care: The patient is accepted for admission to [place]  unit, at [facility]. While holding at [facility], medical decision making responsibilities remain with the [facility]. Upon arrival to [facility], [facility] will assume care. Thank you.   Author: Jonnie Finner, DO 05/24/2022  Check [email or web address] for on-call coverage.  Nursing staff, Please call [facility] number on Amion as soon as patient's arrival, so appropriate admitting provider can evaluate the pt.

## 2022-05-25 ENCOUNTER — Inpatient Hospital Stay (HOSPITAL_COMMUNITY): Payer: Medicare Other

## 2022-05-25 DIAGNOSIS — F419 Anxiety disorder, unspecified: Secondary | ICD-10-CM | POA: Diagnosis not present

## 2022-05-25 DIAGNOSIS — I10 Essential (primary) hypertension: Secondary | ICD-10-CM

## 2022-05-25 DIAGNOSIS — K56609 Unspecified intestinal obstruction, unspecified as to partial versus complete obstruction: Secondary | ICD-10-CM | POA: Diagnosis not present

## 2022-05-25 LAB — CBC
HCT: 44 % (ref 39.0–52.0)
Hemoglobin: 14.8 g/dL (ref 13.0–17.0)
MCH: 31.2 pg (ref 26.0–34.0)
MCHC: 33.6 g/dL (ref 30.0–36.0)
MCV: 92.8 fL (ref 80.0–100.0)
Platelets: 307 10*3/uL (ref 150–400)
RBC: 4.74 MIL/uL (ref 4.22–5.81)
RDW: 12.9 % (ref 11.5–15.5)
WBC: 4.6 10*3/uL (ref 4.0–10.5)
nRBC: 0 % (ref 0.0–0.2)

## 2022-05-25 LAB — COMPREHENSIVE METABOLIC PANEL
ALT: 15 U/L (ref 0–44)
AST: 15 U/L (ref 15–41)
Albumin: 3.7 g/dL (ref 3.5–5.0)
Alkaline Phosphatase: 54 U/L (ref 38–126)
Anion gap: 6 (ref 5–15)
BUN: 23 mg/dL (ref 8–23)
CO2: 27 mmol/L (ref 22–32)
Calcium: 9.1 mg/dL (ref 8.9–10.3)
Chloride: 107 mmol/L (ref 98–111)
Creatinine, Ser: 1 mg/dL (ref 0.61–1.24)
GFR, Estimated: >60 mL/min (ref >60)
Glucose, Bld: 136 mg/dL — ABNORMAL HIGH (ref 70–99)
Potassium: 3.7 mmol/L (ref 3.5–5.1)
Sodium: 140 mmol/L (ref 135–145)
Total Bilirubin: 1.1 mg/dL (ref 0.3–1.2)
Total Protein: 6.3 g/dL — ABNORMAL LOW (ref 6.5–8.1)

## 2022-05-25 LAB — LACTIC ACID, PLASMA: Lactic Acid, Venous: 1.4 mmol/L (ref 0.5–1.9)

## 2022-05-25 MED ORDER — DIPHENHYDRAMINE HCL 50 MG/ML IJ SOLN
25.0000 mg | Freq: Every evening | INTRAMUSCULAR | Status: DC | PRN
  Administered 2022-05-25 – 2022-05-30 (×6): 25 mg via INTRAVENOUS
  Filled 2022-05-25 (×6): qty 1

## 2022-05-25 MED ORDER — ORAL CARE MOUTH RINSE: 15.0000 mL | OROMUCOSAL | Status: DC | PRN

## 2022-05-25 MED ORDER — SODIUM CHLORIDE 0.9 % IV SOLN: INTRAVENOUS | Status: AC

## 2022-05-25 NOTE — Progress Notes (Signed)
PT Cancellation Note  Patient Details Name: Troy [name] MRN: [id number] DOB: 07-30-1946   Cancelled Treatment:    Reason Eval/Treat Not Completed: PT screened, no needs identified, will sign off,Patient ambulating  in hall per RN, no  PT needs. [place] Office [phone] Weekend [phone]    Claretha Cooper 05/25/2022, 12:24 PM

## 2022-05-25 NOTE — Progress Notes (Signed)
[facility] Progress Note     Subjective: CC:  Abd pain slightly better. Still reports trouble getting comfortable overnight. No reported flatus/BM.   RN at bedside during my exam, pulled NG back 8-10 cm. Objective: Vital signs in last 24 hours: Temp:  [97.6 F (36.4 C)-98.8 F (37.1 C)] 97.8 F (36.6 C) (09/14 0401) Pulse Rate:  [74-100] 100 (09/14 0401) Resp:  [16-20] 20 (09/14 0401) BP: (115-148)/(72-98) 121/77 (09/14 0401) SpO2:  [93 %-98 %] 93 % (09/14 0401) Last BM Date : 05/22/22 (per pt)  Intake/Output from previous day: 09/13 0701 - 09/14 0700 In: 1292.7 [I.V.:202.4; NG/GT:90; IV Piggyback:1000.4] Out: 3400 [Urine:400; Emesis/NG output:3000] Intake/Output this shift: No intake/output data recorded.  PE: Gen:  Alert, NAD, pleasant Card:  Regular rate and rhythm, no lower extremity edema. Pulm:  Normal effort, clear to auscultation bilaterally Abd: Soft, moderate distention, minimal upper abd tenderness without guarding Skin: warm and dry, no rashes  Psych: A&Ox3   Lab Results:  Recent Labs    05/24/22 0917 05/25/22 0534  WBC 9.1 4.6  HGB 16.2 14.8  HCT 45.9 44.0  PLT 326 307   BMET Recent Labs    05/24/22 0917 05/25/22 0534  NA 138 140  K 3.7 3.7  CL 100 107  CO2 25 27  GLUCOSE 161* 136*  BUN 23 23  CREATININE 1.09 1.00  CALCIUM 10.2 9.1   PT/INR No results for input(s): "LABPROT", "INR" in the last 72 hours. CMP     Component Value Date/Time   NA 140 05/25/2022 0534   K 3.7 05/25/2022 0534   CL 107 05/25/2022 0534   CO2 27 05/25/2022 0534   GLUCOSE 136 (H) 05/25/2022 0534   BUN 23 05/25/2022 0534   CREATININE 1.00 05/25/2022 0534   CALCIUM 9.1 05/25/2022 0534   PROT 6.3 (L) 05/25/2022 0534   ALBUMIN 3.7 05/25/2022 0534   AST 15 05/25/2022 0534   ALT 15 05/25/2022 0534   ALKPHOS 54 05/25/2022 0534   BILITOT 1.1 05/25/2022 0534   GFRNONAA >60 05/25/2022 0534   GFRAA 53 (L) 05/04/2018 0505   Lipase     Component  Value Date/Time   LIPASE <10 (L) 05/24/2022 0917       Studies/Results: DG Abd Portable 1V-Small Bowel Obstruction Protocol-initial, 8 hr delay  Result Date: 05/25/2022 CLINICAL DATA:  Small-bowel protocol, 8 hours post contrast. EXAM: PORTABLE ABDOMEN - 1 VIEW COMPARISON:  05/24/2022. FINDINGS: Distended loops of small bowel are noted in the abdomen measuring up to 4.7 cm in the left upper quadrant. An enteric tube is seen in the stomach. Small amount of contrast is present in the stomach. The contrast is not visualized beyond the stomach. Surgical clips are noted in the epigastric region. Excreted contrast is present in the urinary bladder. IMPRESSION: 1. Multiple loops of dilated small bowel in the left upper quadrant with increased distention from the prior exam, compatible with known small bowel obstruction. 2. Minimal contrast is noted in the stomach and is not visualized in the small bowel. Electronically Signed   By: Brett Fairy M.D.   On: 05/25/2022 03:00   DG Abd Portable 1 View  Result Date: 05/24/2022 CLINICAL DATA:  Enteric tube placement. EXAM: PORTABLE ABDOMEN - 1 VIEW COMPARISON:  CT abdomen pelvis from same day. Abdominal x-ray dated July 10, 2014. FINDINGS: Enteric tube above the diaphragm in the distal esophagus. Unchanged dilated small bowel loops in the visualized upper abdomen. IMPRESSION: 1. Enteric tube above the diaphragm  in the distal esophagus. Recommend advancement. 2. Unchanged small bowel obstruction. Electronically Signed   By: Titus Dubin M.D.   On: 05/24/2022 11:32   CT ABDOMEN PELVIS W CONTRAST  Result Date: 05/24/2022 CLINICAL DATA:  76 year old male with left lower quadrant pain for 2 days. History of diverticulitis. EXAM: CT ABDOMEN AND PELVIS WITH CONTRAST TECHNIQUE: Multidetector CT imaging of the abdomen and pelvis was performed using the standard protocol following bolus administration of intravenous contrast. RADIATION DOSE REDUCTION: This exam was  performed according to the departmental dose-optimization program which includes automated exposure control, adjustment of the mA and/or kV according to patient size and/or use of iterative reconstruction technique. CONTRAST:  24m OMNIPAQUE IOHEXOL 300 MG/ML  SOLN COMPARISON:  CT Abdomen and Pelvis 06/19/2018. FINDINGS: Lower chest: Stable tortuous descending thoracic aorta. No pericardial or pleural effusion. Mild lung base atelectasis or scarring. Hepatobiliary: Trace perihepatic free fluid. Small chronic gallstones. No CT evidence of acute cholecystitis. No bile duct enlargement. Pancreas: Fatty atrophied. Spleen: Negative. Adrenals/Urinary Tract: Adrenal glands and left kidney are stable and negative. Right renal lower pole seemingly enhancing exophytic nodule measuring 12 mm on series 5, image 41. A tiny low-density area was present here in 2019. Symmetric renal enhancement and contrast excretion. Decompressed bladder. However, there is an abnormal 1.8 cm lobulated bladder hyperdensity along the left posterior wall near the left UVJ. No distal ureteral dilatation. See series 2, image 84 and series 5, image 41. Stomach/Bowel: Mostly decompressed rectosigmoid colon. Extensive sigmoid diverticulosis. Extensive diverticulosis in the descending colon which is decompressed. Small volume of simple density free fluid in the left gutter. Decompressed transverse colon. Mild retained gas and stool in the right colon. Small volume of simple density fluid in the right pericolic gutter. Appendix appears to remain normal on coronal image 69. No large bowel inflammation. The terminal ileum is decompressed. There are multiple decompressed loops of small bowel in the anterior pelvis. Upstream small bowel obstruction with dilated air in fluid-filled loops up to 37 mm diameter beginning in the proximal jejunum and tracking distally. There is an abrupt transition in the mid abdomen on series 2, image 56. In the immediate upstream  loops are among the most inflamed. Note sigmoid mesenteric congestion on series 2, image 48. small volume of free fluid in the affected small bowel mesentery. No pneumoperitoneum. Chronic postoperative changes to the gastroesophageal junction. Duodenum and ligament of Treitz are decompressed. Vascular/Lymphatic: Aortoiliac calcified atherosclerosis. Major arterial structures in the abdomen and pelvis are patent. No lymphadenopathy identified. Reproductive: Chronic right inguinal hernia now contains a small volume of free fluid in addition to mesenteric fat. But no herniated bowel loop. Other: Small volume of free fluid in the pelvis has simple fluid density. Musculoskeletal: Progression of lumbar spine degeneration since 2019. New vacuum disc and progressed endplate degeneration at L3-L4. No acute osseous abnormality identified. IMPRESSION: 1. High-grade Small-bowel Obstruction. Scattered free fluid in the abdomen and pelvis. Abrupt transition point in the mid abdomen on series 2, image 56. Main differential considerations are obstruction due to adhesion or internal hernia. Recommend Surgery consultation. No pneumoperitoneum. 2. Appearance suspicious for both a 1.8 cm left posterior Bladder Wall Mass (series 2, image 84), and a small new solid right renal lower pole mass (1.2 cm series 5, image 41). Consider Bladder and Renal Cell Carcinoma. Recommend follow-up with Urology. 3. A chronic right inguinal hernia now contains free fluid but there is no herniated bowel loop. 4. Chronic cholelithiasis without CT evidence of acute cholecystitis.  Extensive diverticulosis of distal colon. Aortic Atherosclerosis (ICD10-I70.0). Electronically Signed   By: Genevie Ann M.D.   On: 05/24/2022 10:48    Anti-infectives: Anti-infectives (From admission, onward)    None        Assessment/Plan  Small bowel obstruction, suspect related to intra-abdominal adhesions - Afebrile, vital stable, no leukocytosis - CT scan of the  abdomen and pelvis shows a transition point in the mid abdomen, radiologist raises concern for possible internal hernia   Clinically, the patient does not have signs or symptoms of bowel ischemia-his heart rate and vitals are normal, he has no leukocytosis, his abdomen is minimally tender on exam without rebound tenderness.  His lactate has normalized. - SBO protocol 9/13 >> no contrast in the colon - no bowel function, NG high output. NG pulled back, appears to be in duodenum on KUB.  - continue bowel rest. If no ROBF in 24 hours may need surgical intervention, Dx laparoscopy vs laparotomy.   FEN -n.p.o., IV fluids, NG decompression VTE - SCD's, ok for DVT ppx from [facility] standpoint ID -none indicated Admit -TRH service     HTN HLD PMH Prostate CA Incidental finding of a small bladder mass and small renal mass on CT, will need outpatient follow-up with urology     LOS: 1 day   I reviewed nursing notes, hospitalist notes, last 24 h vitals and pain scores, last 48 h intake and output, last 24 h labs and trends, and last 24 h imaging results.   Obie Dredge, PA-C [facility] Please see Amion for pager number during day hours 7:00am-4:30pm

## 2022-05-25 NOTE — Progress Notes (Signed)
PROGRESS NOTE    Troy [name]  ION:[id number] DOB: 01-May-1946 DOA: 05/24/2022 PCP: Derinda Late, MD    Chief Complaint  Patient presents with   Abdominal Pain    Brief Narrative:   Troy [name] is a 76 y.o. male with medical history significant of prostate CA, HLD, HTN. Presenting with nausea/vomiting and abdominal pain.  CT abd and pelvis High-grade Small-bowel Obstruction. Scattered free fluid in the abdomen and pelvis. Abrupt transition point in the mid abdomen on series 2, image 56. Main differential considerations are obstruction due to adhesion or internal hernia. He was admitted for SBO. General surgery consulted and recommendations given.   Assessment & Plan:   Principal Problem:   SBO (small bowel obstruction) (HCC) Active Problems:   Bladder mass   Renal mass   Anxiety   HTN (hypertension)   HLD (hyperlipidemia)  Small Bowel Obstruction;  NPO, NG tube on suction. Gen surgery on board.  Pt remains afebrile and no leukocytosis, abd pain has improved. Lactate has normalized.  Symptomatic management with IV fluids, IV anti emetics. And pain control. Ambulate as tolerated.     Hypertension:  Well controlled.    Anxiety Stable.    New bladder and right renal mass:  Recommend outpatient follow up with [facility].   Hyperlipidemia:     DVT prophylaxis: scds' Code Status: full code.  Family Communication: family at bedside.  Disposition:   Status is: Inpatient Remains inpatient appropriate because: NPO, NG tube, SBO.    Level of care: Med-Surg Consultants:  Gen surgery.   Procedures: none.   Antimicrobials: none.    Subjective: Anxious, uncomfortable.   Objective: Vitals:   05/24/22 2300 05/24/22 2359 05/25/22 0401 05/25/22 1306  BP: (!) 140/90 (!) 140/90 121/77 138/89  Pulse: 94 97 100 94  Resp: '20 20 20 18  '$ Temp: 98 F (36.7 C) 98 F (36.7 C) 97.8 F (36.6 C) 98.9 F (37.2 C)  TempSrc: Oral Oral Oral Oral  SpO2: 98% 94%  93% 94%  Weight:      Height:        Intake/Output Summary (Last 24 hours) at 05/25/2022 1551 Last data filed at 05/25/2022 1030 Gross per 24 hour  Intake 90 ml  Output 4000 ml  Net -3910 ml   Filed Weights   05/24/22 0907  Weight: 106.6 kg    Examination:  General exam: Appears calm and comfortable  Respiratory system: Clear to auscultation. Respiratory effort normal. Cardiovascular system: S1 & S2 heard, RRR. No JVD,  No pedal edema. Gastrointestinal system: Abdomen is soft, distended, bowel sounds hyperactive.  Central nervous system: Alert and oriented. No focal neurological deficits. Extremities: Symmetric 5 x 5 power. Skin: No rashes, lesions or ulcers Psychiatry: Judgement and insight appear normal. Mood & affect appropriate.     Data Reviewed: I have personally reviewed following labs and imaging studies  CBC: Recent Labs  Lab 05/24/22 0917 05/25/22 0534  WBC 9.1 4.6  HGB 16.2 14.8  HCT 45.9 44.0  MCV 89.0 92.8  PLT 326 244    Basic Metabolic Panel: Recent Labs  Lab 05/24/22 0917 05/25/22 0534  NA 138 140  K 3.7 3.7  CL 100 107  CO2 25 27  GLUCOSE 161* 136*  BUN 23 23  CREATININE 1.09 1.00  CALCIUM 10.2 9.1    GFR: Estimated Creatinine Clearance: 75.6 mL/min (by C-G formula based on SCr of 1 mg/dL).  Liver Function Tests: Recent Labs  Lab 05/24/22 [id number] 05/25/22 0534  AST 14* 15  ALT 17 15  ALKPHOS 70 54  BILITOT 1.5* 1.1  PROT 7.8 6.3*  ALBUMIN 4.8 3.7    CBG: No results for input(s): "GLUCAP" in the last 168 hours.   Recent Results (from the past 240 hour(s))  Resp Panel by RT-PCR (Flu A&B, Covid) Anterior Nasal Swab     Status: None   Collection Time: 05/24/22 10:06 AM   Specimen: Anterior Nasal Swab  Result Value Ref Range Status   SARS Coronavirus 2 by RT PCR NEGATIVE NEGATIVE Final    Comment: (NOTE) SARS-CoV-2 target nucleic acids are NOT DETECTED.  The SARS-CoV-2 RNA is generally detectable in upper  respiratory specimens during the acute phase of infection. The lowest concentration of SARS-CoV-2 viral copies this assay can detect is 138 copies/mL. A negative result does not preclude SARS-Cov-2 infection and should not be used as the sole basis for treatment or other patient management decisions. A negative result may occur with  improper specimen collection/handling, submission of specimen other than nasopharyngeal swab, presence of viral mutation(s) within the areas targeted by this assay, and inadequate number of viral copies(<138 copies/mL). A negative result must be combined with clinical observations, patient history, and epidemiological information. The expected result is Negative.  Fact Sheet for Patients:  EntrepreneurPulse.com.au  Fact Sheet for Healthcare Providers:  IncredibleEmployment.be  This test is no t yet approved or cleared by the [place] FDA and  has been authorized for detection and/or diagnosis of SARS-CoV-2 by FDA under an Emergency Use Authorization (EUA). This EUA will remain  in effect (meaning this test can be used) for the duration of the COVID-19 declaration under Section 564(b)(1) of the Act, 21 U.S.C.section 360bbb-3(b)(1), unless the authorization is terminated  or revoked sooner.       Influenza A by PCR NEGATIVE NEGATIVE Final   Influenza B by PCR NEGATIVE NEGATIVE Final    Comment: (NOTE) The Xpert Xpress SARS-CoV-2/FLU/RSV plus assay is intended as an aid in the diagnosis of influenza from Nasopharyngeal swab specimens and should not be used as a sole basis for treatment. Nasal washings and aspirates are unacceptable for Xpert Xpress SARS-CoV-2/FLU/RSV testing.  Fact Sheet for Patients: EntrepreneurPulse.com.au  Fact Sheet for Healthcare Providers: IncredibleEmployment.be  This test is not yet approved or cleared by the [place] FDA and has been  authorized for detection and/or diagnosis of SARS-CoV-2 by FDA under an Emergency Use Authorization (EUA). This EUA will remain in effect (meaning this test can be used) for the duration of the COVID-19 declaration under Section 564(b)(1) of the Act, 21 U.S.C. section 360bbb-3(b)(1), unless the authorization is terminated or revoked.  Performed at [facility], [address] [place], [place] [address]          Radiology Studies: DG Abd Portable 1V-Small Bowel Obstruction Protocol-initial, 8 hr delay  Result Date: 05/25/2022 CLINICAL DATA:  Small-bowel protocol, 8 hours post contrast. EXAM: PORTABLE ABDOMEN - 1 VIEW COMPARISON:  05/24/2022. FINDINGS: Distended loops of small bowel are noted in the abdomen measuring up to 4.7 cm in the left upper quadrant. An enteric tube is seen in the stomach. Small amount of contrast is present in the stomach. The contrast is not visualized beyond the stomach. Surgical clips are noted in the epigastric region. Excreted contrast is present in the urinary bladder. IMPRESSION: 1. Multiple loops of dilated small bowel in the left upper quadrant with increased distention from the prior exam, compatible with known small bowel obstruction. 2. Minimal contrast  is noted in the stomach and is not visualized in the small bowel. Electronically Signed   By: Brett Fairy M.D.   On: 05/25/2022 03:00   DG Abd Portable 1 View  Result Date: 05/24/2022 CLINICAL DATA:  Enteric tube placement. EXAM: PORTABLE ABDOMEN - 1 VIEW COMPARISON:  CT abdomen pelvis from same day. Abdominal x-ray dated July 10, 2014. FINDINGS: Enteric tube above the diaphragm in the distal esophagus. Unchanged dilated small bowel loops in the visualized upper abdomen. IMPRESSION: 1. Enteric tube above the diaphragm in the distal esophagus. Recommend advancement. 2. Unchanged small bowel obstruction. Electronically Signed   By: Titus Dubin M.D.   On: 05/24/2022 11:32   CT  ABDOMEN PELVIS W CONTRAST  Result Date: 05/24/2022 CLINICAL DATA:  75 year old male with left lower quadrant pain for 2 days. History of diverticulitis. EXAM: CT ABDOMEN AND PELVIS WITH CONTRAST TECHNIQUE: Multidetector CT imaging of the abdomen and pelvis was performed using the standard protocol following bolus administration of intravenous contrast. RADIATION DOSE REDUCTION: This exam was performed according to the departmental dose-optimization program which includes automated exposure control, adjustment of the mA and/or kV according to patient size and/or use of iterative reconstruction technique. CONTRAST:  19m OMNIPAQUE IOHEXOL 300 MG/ML  SOLN COMPARISON:  CT Abdomen and Pelvis 06/19/2018. FINDINGS: Lower chest: Stable tortuous descending thoracic aorta. No pericardial or pleural effusion. Mild lung base atelectasis or scarring. Hepatobiliary: Trace perihepatic free fluid. Small chronic gallstones. No CT evidence of acute cholecystitis. No bile duct enlargement. Pancreas: Fatty atrophied. Spleen: Negative. Adrenals/Urinary Tract: Adrenal glands and left kidney are stable and negative. Right renal lower pole seemingly enhancing exophytic nodule measuring 12 mm on series 5, image 41. A tiny low-density area was present here in 2019. Symmetric renal enhancement and contrast excretion. Decompressed bladder. However, there is an abnormal 1.8 cm lobulated bladder hyperdensity along the left posterior wall near the left UVJ. No distal ureteral dilatation. See series 2, image 84 and series 5, image 41. Stomach/Bowel: Mostly decompressed rectosigmoid colon. Extensive sigmoid diverticulosis. Extensive diverticulosis in the descending colon which is decompressed. Small volume of simple density free fluid in the left gutter. Decompressed transverse colon. Mild retained gas and stool in the right colon. Small volume of simple density fluid in the right pericolic gutter. Appendix appears to remain normal on coronal  image 69. No large bowel inflammation. The terminal ileum is decompressed. There are multiple decompressed loops of small bowel in the anterior pelvis. Upstream small bowel obstruction with dilated air in fluid-filled loops up to 37 mm diameter beginning in the proximal jejunum and tracking distally. There is an abrupt transition in the mid abdomen on series 2, image 56. In the immediate upstream loops are among the most inflamed. Note sigmoid mesenteric congestion on series 2, image 48. small volume of free fluid in the affected small bowel mesentery. No pneumoperitoneum. Chronic postoperative changes to the gastroesophageal junction. Duodenum and ligament of Treitz are decompressed. Vascular/Lymphatic: Aortoiliac calcified atherosclerosis. Major arterial structures in the abdomen and pelvis are patent. No lymphadenopathy identified. Reproductive: Chronic right inguinal hernia now contains a small volume of free fluid in addition to mesenteric fat. But no herniated bowel loop. Other: Small volume of free fluid in the pelvis has simple fluid density. Musculoskeletal: Progression of lumbar spine degeneration since 2019. New vacuum disc and progressed endplate degeneration at L3-L4. No acute osseous abnormality identified. IMPRESSION: 1. High-grade Small-bowel Obstruction. Scattered free fluid in the abdomen and pelvis. Abrupt transition point in the mid  abdomen on series 2, image 56. Main differential considerations are obstruction due to adhesion or internal hernia. Recommend Surgery consultation. No pneumoperitoneum. 2. Appearance suspicious for both a 1.8 cm left posterior Bladder Wall Mass (series 2, image 84), and a small new solid right renal lower pole mass (1.2 cm series 5, image 41). Consider Bladder and Renal Cell Carcinoma. Recommend follow-up with Urology. 3. A chronic right inguinal hernia now contains free fluid but there is no herniated bowel loop. 4. Chronic cholelithiasis without CT evidence of acute  cholecystitis. Extensive diverticulosis of distal colon. Aortic Atherosclerosis (ICD10-I70.0). Electronically Signed   By: Genevie Ann M.D.   On: 05/24/2022 10:48        Scheduled Meds:  enoxaparin (LOVENOX) injection  40 mg Subcutaneous Q24H   Continuous Infusions:   LOS: 1 day        Hosie Poisson, MD [facility]   To contact the attending provider between 7A-7P or the covering provider during after hours 7P-7A, please log into the web site [email or web address] and access using universal [place] password for that web site. If you do not have the password, please call the hospital operator.  05/25/2022, 3:51 PM

## 2022-05-26 ENCOUNTER — Inpatient Hospital Stay (HOSPITAL_COMMUNITY): Payer: Medicare Other

## 2022-05-26 DIAGNOSIS — K56609 Unspecified intestinal obstruction, unspecified as to partial versus complete obstruction: Secondary | ICD-10-CM | POA: Diagnosis not present

## 2022-05-26 NOTE — Progress Notes (Signed)
[facility] Progress Note     Subjective: CC:  Reports feeling a little better today - denies abd pain. Starting passing  some flatus this morning and feels like he could have a BM. NG output 1600 cc/24h.  Objective: Vital signs in last 24 hours: Temp:  [97.3 F (36.3 C)-99.1 F (37.3 C)] 97.3 F (36.3 C) (09/15 0426) Pulse Rate:  [94-105] 105 (09/15 0426) Resp:  [18-20] 19 (09/15 0426) BP: (121-143)/(80-89) 121/80 (09/15 0426) SpO2:  [91 %-94 %] 92 % (09/15 0426) Last BM Date : 05/22/22  Intake/Output from previous day: 09/14 0701 - 09/15 0700 In: 889.4 [P.O.:220; I.V.:609.4; NG/GT:60] Out: 1600 [Emesis/NG output:1600] Intake/Output this shift: No intake/output data recorded.  PE: Gen:  Alert, NAD, pleasant Card:  Regular rate and rhythm Pulm:  Normal effort Abd: Soft, mild distention, nontender, NG with bilious effluent - I clamped this so he could go to bathroom/walk. Skin: warm and dry, no rashes  Psych: A&Ox3   Lab Results:  Recent Labs    05/24/22 0917 05/25/22 0534  WBC 9.1 4.6  HGB 16.2 14.8  HCT 45.9 44.0  PLT 326 307   BMET Recent Labs    05/24/22 0917 05/25/22 0534  NA 138 140  K 3.7 3.7  CL 100 107  CO2 25 27  GLUCOSE 161* 136*  BUN 23 23  CREATININE 1.09 1.00  CALCIUM 10.2 9.1   PT/INR No results for input(s): "LABPROT", "INR" in the last 72 hours. CMP     Component Value Date/Time   NA 140 05/25/2022 0534   K 3.7 05/25/2022 0534   CL 107 05/25/2022 0534   CO2 27 05/25/2022 0534   GLUCOSE 136 (H) 05/25/2022 0534   BUN 23 05/25/2022 0534   CREATININE 1.00 05/25/2022 0534   CALCIUM 9.1 05/25/2022 0534   PROT 6.3 (L) 05/25/2022 0534   ALBUMIN 3.7 05/25/2022 0534   AST 15 05/25/2022 0534   ALT 15 05/25/2022 0534   ALKPHOS 54 05/25/2022 0534   BILITOT 1.1 05/25/2022 0534   GFRNONAA >60 05/25/2022 0534   GFRAA 53 (L) 05/04/2018 0505   Lipase     Component Value Date/Time   LIPASE <10 (L) 05/24/2022 0917        Studies/Results: DG Abd Portable 1V  Result Date: 05/26/2022 CLINICAL DATA:  Small-bowel obstruction. EXAM: PORTABLE ABDOMEN - 1 VIEW COMPARISON:  Abdominal radiograph 05/25/2022 FINDINGS: Enteric tube courses below diaphragm with the side hole terminating in the distal stomach and the tip terminating in the region of the pylorus/proximal duodenum. The enteric tube appears slightly retracted compared to 05/25/2022. There are persistently dilated loops of bowel in the central abdomen measuring up to 4.7 cm, which is not significantly changed compared to prior exam. There is no evidence of pneumatosis. No supine evidence of pneumoperitoneum. Pelvic phlebolith is visualized. There are multilevel degenerative changes in the lower lumbar spine. Visualized lung bases are unremarkable. IMPRESSION: 1. Redemonstrated dilated loops of small bowel in the central and left upper quadrant, not significantly changed compared to prior exam, compatible with known small bowel obstruction. 2. Compared to prior exam there is slight interval retraction of the enteric tube which still terminates in the peripyloric region. Electronically Signed   By: Marin Roberts M.D.   On: 05/26/2022 08:05   DG Abd Portable 1V-Small Bowel Obstruction Protocol-initial, 8 hr delay  Result Date: 05/25/2022 CLINICAL DATA:  Small-bowel protocol, 8 hours post contrast. EXAM: PORTABLE ABDOMEN - 1 VIEW COMPARISON:  05/24/2022. FINDINGS: Distended  loops of small bowel are noted in the abdomen measuring up to 4.7 cm in the left upper quadrant. An enteric tube is seen in the stomach. Small amount of contrast is present in the stomach. The contrast is not visualized beyond the stomach. Surgical clips are noted in the epigastric region. Excreted contrast is present in the urinary bladder. IMPRESSION: 1. Multiple loops of dilated small bowel in the left upper quadrant with increased distention from the prior exam, compatible with known small bowel  obstruction. 2. Minimal contrast is noted in the stomach and is not visualized in the small bowel. Electronically Signed   By: Brett Fairy M.D.   On: 05/25/2022 03:00   DG Abd Portable 1 View  Result Date: 05/24/2022 CLINICAL DATA:  Enteric tube placement. EXAM: PORTABLE ABDOMEN - 1 VIEW COMPARISON:  CT abdomen pelvis from same day. Abdominal x-ray dated July 10, 2014. FINDINGS: Enteric tube above the diaphragm in the distal esophagus. Unchanged dilated small bowel loops in the visualized upper abdomen. IMPRESSION: 1. Enteric tube above the diaphragm in the distal esophagus. Recommend advancement. 2. Unchanged small bowel obstruction. Electronically Signed   By: Titus Dubin M.D.   On: 05/24/2022 11:32   CT ABDOMEN PELVIS W CONTRAST  Result Date: 05/24/2022 CLINICAL DATA:  76 year old male with left lower quadrant pain for 2 days. History of diverticulitis. EXAM: CT ABDOMEN AND PELVIS WITH CONTRAST TECHNIQUE: Multidetector CT imaging of the abdomen and pelvis was performed using the standard protocol following bolus administration of intravenous contrast. RADIATION DOSE REDUCTION: This exam was performed according to the departmental dose-optimization program which includes automated exposure control, adjustment of the mA and/or kV according to patient size and/or use of iterative reconstruction technique. CONTRAST:  25m OMNIPAQUE IOHEXOL 300 MG/ML  SOLN COMPARISON:  CT Abdomen and Pelvis 06/19/2018. FINDINGS: Lower chest: Stable tortuous descending thoracic aorta. No pericardial or pleural effusion. Mild lung base atelectasis or scarring. Hepatobiliary: Trace perihepatic free fluid. Small chronic gallstones. No CT evidence of acute cholecystitis. No bile duct enlargement. Pancreas: Fatty atrophied. Spleen: Negative. Adrenals/Urinary Tract: Adrenal glands and left kidney are stable and negative. Right renal lower pole seemingly enhancing exophytic nodule measuring 12 mm on series 5, image 41. A tiny  low-density area was present here in 2019. Symmetric renal enhancement and contrast excretion. Decompressed bladder. However, there is an abnormal 1.8 cm lobulated bladder hyperdensity along the left posterior wall near the left UVJ. No distal ureteral dilatation. See series 2, image 84 and series 5, image 41. Stomach/Bowel: Mostly decompressed rectosigmoid colon. Extensive sigmoid diverticulosis. Extensive diverticulosis in the descending colon which is decompressed. Small volume of simple density free fluid in the left gutter. Decompressed transverse colon. Mild retained gas and stool in the right colon. Small volume of simple density fluid in the right pericolic gutter. Appendix appears to remain normal on coronal image 69. No large bowel inflammation. The terminal ileum is decompressed. There are multiple decompressed loops of small bowel in the anterior pelvis. Upstream small bowel obstruction with dilated air in fluid-filled loops up to 37 mm diameter beginning in the proximal jejunum and tracking distally. There is an abrupt transition in the mid abdomen on series 2, image 56. In the immediate upstream loops are among the most inflamed. Note sigmoid mesenteric congestion on series 2, image 48. small volume of free fluid in the affected small bowel mesentery. No pneumoperitoneum. Chronic postoperative changes to the gastroesophageal junction. Duodenum and ligament of Treitz are decompressed. Vascular/Lymphatic: Aortoiliac calcified atherosclerosis. Major  arterial structures in the abdomen and pelvis are patent. No lymphadenopathy identified. Reproductive: Chronic right inguinal hernia now contains a small volume of free fluid in addition to mesenteric fat. But no herniated bowel loop. Other: Small volume of free fluid in the pelvis has simple fluid density. Musculoskeletal: Progression of lumbar spine degeneration since 2019. New vacuum disc and progressed endplate degeneration at L3-L4. No acute osseous  abnormality identified. IMPRESSION: 1. High-grade Small-bowel Obstruction. Scattered free fluid in the abdomen and pelvis. Abrupt transition point in the mid abdomen on series 2, image 56. Main differential considerations are obstruction due to adhesion or internal hernia. Recommend Surgery consultation. No pneumoperitoneum. 2. Appearance suspicious for both a 1.8 cm left posterior Bladder Wall Mass (series 2, image 84), and a small new solid right renal lower pole mass (1.2 cm series 5, image 41). Consider Bladder and Renal Cell Carcinoma. Recommend follow-up with Urology. 3. A chronic right inguinal hernia now contains free fluid but there is no herniated bowel loop. 4. Chronic cholelithiasis without CT evidence of acute cholecystitis. Extensive diverticulosis of distal colon. Aortic Atherosclerosis (ICD10-I70.0). Electronically Signed   By: Genevie Ann M.D.   On: 05/24/2022 10:48    Anti-infectives: Anti-infectives (From admission, onward)    None        Assessment/Plan  Small bowel obstruction, suspect related to intra-abdominal adhesions - Afebrile, vital stable, no leukocytosis - CT scan of the abdomen and pelvis shows a transition point in the mid abdomen, radiologist raises concern for possible internal hernia   Clinically, the patient does not have signs or symptoms of bowel ischemia-his heart rate and vitals are normal, he has no leukocytosis, his abdomen is minimally tender on exam without rebound tenderness.  His lactate has normalized. - SBO protocol 9/13 >> no contrast in the colon; KUB today with persistent SBO pattern. - patient with flatus this morning followed by a BM. Clinically improving. Clamp NG and check residuals in 6 hours. - no emergent surgical needs.  FEN -n.p.o., IV fluids, NG clamped  VTE - SCD's, ok for DVT ppx from [facility] standpoint ID -none indicated Admit -TRH service     HTN HLD PMH Prostate CA Incidental finding of a small bladder mass and small renal mass on  CT, will need outpatient follow-up with urology     LOS: 2 days   I reviewed nursing notes, hospitalist notes, last 24 h vitals and pain scores, last 48 h intake and output, last 24 h labs and trends, and last 24 h imaging results.   Obie Dredge, PA-C [facility] Please see Amion for pager number during day hours 7:00am-4:30pm

## 2022-05-26 NOTE — Progress Notes (Signed)
PROGRESS NOTE    Troy [name]  PIR:[id number] DOB: Feb 04, 1946 DOA: 05/24/2022 PCP: Derinda Late, MD    Chief Complaint  Patient presents with   Abdominal Pain    Brief Narrative:   Troy [name] is a 76 y.o. male with medical history significant of prostate CA, HLD, HTN. Presenting with nausea/vomiting and abdominal pain.  CT abd and pelvis High-grade Small-bowel Obstruction. Scattered free fluid in the abdomen and pelvis. Abrupt transition point in the mid abdomen on series 2, image 56. Main differential considerations are obstruction due to adhesion or internal hernia. He was admitted for SBO. General surgery consulted and recommendations given.  BM on 9/15.  Assessment & Plan:   Principal Problem:   SBO (small bowel obstruction) (HCC) Active Problems:   Bladder mass   Renal mass   Anxiety   HTN (hypertension)   HLD (hyperlipidemia)  Small Bowel Obstruction;  -NPO except ice, NG tube on suction. Gen surgery on board.  Pt remains afebrile and no leukocytosis, abd pain has improved. Lactate has normalized.  Symptomatic management with IV fluids, IV anti emetics. And pain control. Ambulate as tolerated.  +BM 9/15  Hypertension:  Well controlled.   Anxiety Stable.   New bladder and right renal mass:  Recommend outpatient follow up with [facility].   Hyperlipidemia:  -resume meds once tolerating diet  Obesity Estimated body mass index is 34.7 kg/m as calculated from the following:   Height as of this encounter: '5\' 9"'$  (1.753 m).   Weight as of this encounter: 106.6 kg.   DVT prophylaxis: scds' Code Status: full code.  Family Communication: family at bedside.  Disposition:   Status is: Inpatient Remains inpatient appropriate because: NPO, NG tube, SBO.    Level of care: Med-Surg Consultants:  Gen surgery.      Subjective: Asking for ice chips  Objective: Vitals:   05/25/22 0401 05/25/22 1306 05/25/22 2104 05/26/22 0426  BP: 121/77 138/89  (!) 143/87 121/80  Pulse: 100 94 99 (!) 105  Resp: '20 18 20 19  '$ Temp: 97.8 F (36.6 C) 98.9 F (37.2 C) 99.1 F (37.3 C) (!) 97.3 F (36.3 C)  TempSrc: Oral Oral    SpO2: 93% 94% 91% 92%  Weight:      Height:        Intake/Output Summary (Last 24 hours) at 05/26/2022 0947 Last data filed at 05/26/2022 0600 Gross per 24 hour  Intake 889.39 ml  Output 1600 ml  Net -710.61 ml   Filed Weights   05/24/22 0907  Weight: 106.6 kg    Examination:   General: Appearance:    Obese male in no acute distress     Lungs:     respirations unlabored  Heart:    Tachycardic.   MS:   All extremities are intact.   Neurologic:   Awake, alert       Data Reviewed: I have personally reviewed following labs and imaging studies  CBC: Recent Labs  Lab 05/24/22 0917 05/25/22 0534  WBC 9.1 4.6  HGB 16.2 14.8  HCT 45.9 44.0  MCV 89.0 92.8  PLT 326 630    Basic Metabolic Panel: Recent Labs  Lab 05/24/22 0917 05/25/22 0534  NA 138 140  K 3.7 3.7  CL 100 107  CO2 25 27  GLUCOSE 161* 136*  BUN 23 23  CREATININE 1.09 1.00  CALCIUM 10.2 9.1    GFR: Estimated Creatinine Clearance: 75.6 mL/min (by C-G formula based on SCr of 1  mg/dL).  Liver Function Tests: Recent Labs  Lab 05/24/22 0917 05/25/22 0534  AST 14* 15  ALT 17 15  ALKPHOS 70 54  BILITOT 1.5* 1.1  PROT 7.8 6.3*  ALBUMIN 4.8 3.7    CBG: No results for input(s): "GLUCAP" in the last 168 hours.   Recent Results (from the past 240 hour(s))  Resp Panel by RT-PCR (Flu A&B, Covid) Anterior Nasal Swab     Status: None   Collection Time: 05/24/22 10:06 AM   Specimen: Anterior Nasal Swab  Result Value Ref Range Status   SARS Coronavirus 2 by RT PCR NEGATIVE NEGATIVE Final    Comment: (NOTE) SARS-CoV-2 target nucleic acids are NOT DETECTED.  The SARS-CoV-2 RNA is generally detectable in upper respiratory specimens during the acute phase of infection. The lowest concentration of SARS-CoV-2 viral copies this  assay can detect is 138 copies/mL. A negative result does not preclude SARS-Cov-2 infection and should not be used as the sole basis for treatment or other patient management decisions. A negative result may occur with  improper specimen collection/handling, submission of specimen other than nasopharyngeal swab, presence of viral mutation(s) within the areas targeted by this assay, and inadequate number of viral copies(<138 copies/mL). A negative result must be combined with clinical observations, patient history, and epidemiological information. The expected result is Negative.  Fact Sheet for Patients:  EntrepreneurPulse.com.au  Fact Sheet for Healthcare Providers:  IncredibleEmployment.be  This test is no t yet approved or cleared by the [place] FDA and  has been authorized for detection and/or diagnosis of SARS-CoV-2 by FDA under an Emergency Use Authorization (EUA). This EUA will remain  in effect (meaning this test can be used) for the duration of the COVID-19 declaration under Section 564(b)(1) of the Act, 21 U.S.C.section 360bbb-3(b)(1), unless the authorization is terminated  or revoked sooner.       Influenza A by PCR NEGATIVE NEGATIVE Final   Influenza B by PCR NEGATIVE NEGATIVE Final    Comment: (NOTE) The Xpert Xpress SARS-CoV-2/FLU/RSV plus assay is intended as an aid in the diagnosis of influenza from Nasopharyngeal swab specimens and should not be used as a sole basis for treatment. Nasal washings and aspirates are unacceptable for Xpert Xpress SARS-CoV-2/FLU/RSV testing.  Fact Sheet for Patients: EntrepreneurPulse.com.au  Fact Sheet for Healthcare Providers: IncredibleEmployment.be  This test is not yet approved or cleared by the [place] FDA and has been authorized for detection and/or diagnosis of SARS-CoV-2 by FDA under an Emergency Use Authorization (EUA). This EUA will  remain in effect (meaning this test can be used) for the duration of the COVID-19 declaration under Section 564(b)(1) of the Act, 21 U.S.C. section 360bbb-3(b)(1), unless the authorization is terminated or revoked.  Performed at [facility], [address], [place], [place] [address]          Radiology Studies: DG Abd Portable 1V  Result Date: 05/26/2022 CLINICAL DATA:  Small-bowel obstruction. EXAM: PORTABLE ABDOMEN - 1 VIEW COMPARISON:  Abdominal radiograph 05/25/2022 FINDINGS: Enteric tube courses below diaphragm with the side hole terminating in the distal stomach and the tip terminating in the region of the pylorus/proximal duodenum. The enteric tube appears slightly retracted compared to 05/25/2022. There are persistently dilated loops of bowel in the central abdomen measuring up to 4.7 cm, which is not significantly changed compared to prior exam. There is no evidence of pneumatosis. No supine evidence of pneumoperitoneum. Pelvic phlebolith is visualized. There are multilevel degenerative changes in the lower lumbar spine.  Visualized lung bases are unremarkable. IMPRESSION: 1. Redemonstrated dilated loops of small bowel in the central and left upper quadrant, not significantly changed compared to prior exam, compatible with known small bowel obstruction. 2. Compared to prior exam there is slight interval retraction of the enteric tube which still terminates in the peripyloric region. Electronically Signed   By: Marin Roberts M.D.   On: 05/26/2022 08:05   DG Abd Portable 1V-Small Bowel Obstruction Protocol-initial, 8 hr delay  Result Date: 05/25/2022 CLINICAL DATA:  Small-bowel protocol, 8 hours post contrast. EXAM: PORTABLE ABDOMEN - 1 VIEW COMPARISON:  05/24/2022. FINDINGS: Distended loops of small bowel are noted in the abdomen measuring up to 4.7 cm in the left upper quadrant. An enteric tube is seen in the stomach. Small amount of contrast is present in the  stomach. The contrast is not visualized beyond the stomach. Surgical clips are noted in the epigastric region. Excreted contrast is present in the urinary bladder. IMPRESSION: 1. Multiple loops of dilated small bowel in the left upper quadrant with increased distention from the prior exam, compatible with known small bowel obstruction. 2. Minimal contrast is noted in the stomach and is not visualized in the small bowel. Electronically Signed   By: Brett Fairy M.D.   On: 05/25/2022 03:00   DG Abd Portable 1 View  Result Date: 05/24/2022 CLINICAL DATA:  Enteric tube placement. EXAM: PORTABLE ABDOMEN - 1 VIEW COMPARISON:  CT abdomen pelvis from same day. Abdominal x-ray dated July 10, 2014. FINDINGS: Enteric tube above the diaphragm in the distal esophagus. Unchanged dilated small bowel loops in the visualized upper abdomen. IMPRESSION: 1. Enteric tube above the diaphragm in the distal esophagus. Recommend advancement. 2. Unchanged small bowel obstruction. Electronically Signed   By: Titus Dubin M.D.   On: 05/24/2022 11:32   CT ABDOMEN PELVIS W CONTRAST  Result Date: 05/24/2022 CLINICAL DATA:  76 year old male with left lower quadrant pain for 2 days. History of diverticulitis. EXAM: CT ABDOMEN AND PELVIS WITH CONTRAST TECHNIQUE: Multidetector CT imaging of the abdomen and pelvis was performed using the standard protocol following bolus administration of intravenous contrast. RADIATION DOSE REDUCTION: This exam was performed according to the departmental dose-optimization program which includes automated exposure control, adjustment of the mA and/or kV according to patient size and/or use of iterative reconstruction technique. CONTRAST:  9m OMNIPAQUE IOHEXOL 300 MG/ML  SOLN COMPARISON:  CT Abdomen and Pelvis 06/19/2018. FINDINGS: Lower chest: Stable tortuous descending thoracic aorta. No pericardial or pleural effusion. Mild lung base atelectasis or scarring. Hepatobiliary: Trace perihepatic free  fluid. Small chronic gallstones. No CT evidence of acute cholecystitis. No bile duct enlargement. Pancreas: Fatty atrophied. Spleen: Negative. Adrenals/Urinary Tract: Adrenal glands and left kidney are stable and negative. Right renal lower pole seemingly enhancing exophytic nodule measuring 12 mm on series 5, image 41. A tiny low-density area was present here in 2019. Symmetric renal enhancement and contrast excretion. Decompressed bladder. However, there is an abnormal 1.8 cm lobulated bladder hyperdensity along the left posterior wall near the left UVJ. No distal ureteral dilatation. See series 2, image 84 and series 5, image 41. Stomach/Bowel: Mostly decompressed rectosigmoid colon. Extensive sigmoid diverticulosis. Extensive diverticulosis in the descending colon which is decompressed. Small volume of simple density free fluid in the left gutter. Decompressed transverse colon. Mild retained gas and stool in the right colon. Small volume of simple density fluid in the right pericolic gutter. Appendix appears to remain normal on coronal image 69. No large bowel inflammation. The  terminal ileum is decompressed. There are multiple decompressed loops of small bowel in the anterior pelvis. Upstream small bowel obstruction with dilated air in fluid-filled loops up to 37 mm diameter beginning in the proximal jejunum and tracking distally. There is an abrupt transition in the mid abdomen on series 2, image 56. In the immediate upstream loops are among the most inflamed. Note sigmoid mesenteric congestion on series 2, image 48. small volume of free fluid in the affected small bowel mesentery. No pneumoperitoneum. Chronic postoperative changes to the gastroesophageal junction. Duodenum and ligament of Treitz are decompressed. Vascular/Lymphatic: Aortoiliac calcified atherosclerosis. Major arterial structures in the abdomen and pelvis are patent. No lymphadenopathy identified. Reproductive: Chronic right inguinal hernia  now contains a small volume of free fluid in addition to mesenteric fat. But no herniated bowel loop. Other: Small volume of free fluid in the pelvis has simple fluid density. Musculoskeletal: Progression of lumbar spine degeneration since 2019. New vacuum disc and progressed endplate degeneration at L3-L4. No acute osseous abnormality identified. IMPRESSION: 1. High-grade Small-bowel Obstruction. Scattered free fluid in the abdomen and pelvis. Abrupt transition point in the mid abdomen on series 2, image 56. Main differential considerations are obstruction due to adhesion or internal hernia. Recommend Surgery consultation. No pneumoperitoneum. 2. Appearance suspicious for both a 1.8 cm left posterior Bladder Wall Mass (series 2, image 84), and a small new solid right renal lower pole mass (1.2 cm series 5, image 41). Consider Bladder and Renal Cell Carcinoma. Recommend follow-up with Urology. 3. A chronic right inguinal hernia now contains free fluid but there is no herniated bowel loop. 4. Chronic cholelithiasis without CT evidence of acute cholecystitis. Extensive diverticulosis of distal colon. Aortic Atherosclerosis (ICD10-I70.0). Electronically Signed   By: Genevie Ann M.D.   On: 05/24/2022 10:48        Scheduled Meds:  enoxaparin (LOVENOX) injection  40 mg Subcutaneous Q24H   Continuous Infusions:  sodium chloride 75 mL/hr at 05/26/22 0751     LOS: 2 days        Geradine Girt, DO [facility]   To contact the attending provider between 7A-7P or the covering provider during after hours 7P-7A, please log into the web site [email or web address] and access using universal [place] password for that web site. If you do not have the password, please call the hospital operator.  05/26/2022, 9:47 AM

## 2022-05-27 ENCOUNTER — Inpatient Hospital Stay (HOSPITAL_COMMUNITY): Payer: Medicare Other

## 2022-05-27 DIAGNOSIS — K56609 Unspecified intestinal obstruction, unspecified as to partial versus complete obstruction: Secondary | ICD-10-CM | POA: Diagnosis not present

## 2022-05-27 LAB — CBC
HCT: 40.8 % (ref 39.0–52.0)
Hemoglobin: 13.6 g/dL (ref 13.0–17.0)
MCH: 31.6 pg (ref 26.0–34.0)
MCHC: 33.3 g/dL (ref 30.0–36.0)
MCV: 94.9 fL (ref 80.0–100.0)
Platelets: 249 10*3/uL (ref 150–400)
RBC: 4.3 MIL/uL (ref 4.22–5.81)
RDW: 12.8 % (ref 11.5–15.5)
WBC: 4.5 10*3/uL (ref 4.0–10.5)
nRBC: 0 % (ref 0.0–0.2)

## 2022-05-27 LAB — BASIC METABOLIC PANEL
Anion gap: 7 (ref 5–15)
BUN: 33 mg/dL — ABNORMAL HIGH (ref 8–23)
CO2: 30 mmol/L (ref 22–32)
Calcium: 8.3 mg/dL — ABNORMAL LOW (ref 8.9–10.3)
Chloride: 107 mmol/L (ref 98–111)
Creatinine, Ser: 0.94 mg/dL (ref 0.61–1.24)
GFR, Estimated: >60 mL/min (ref >60)
Glucose, Bld: 134 mg/dL — ABNORMAL HIGH (ref 70–99)
Potassium: 3.1 mmol/L — ABNORMAL LOW (ref 3.5–5.1)
Sodium: 144 mmol/L (ref 135–145)

## 2022-05-27 LAB — MAGNESIUM: Magnesium: 2.3 mg/dL (ref 1.7–2.4)

## 2022-05-27 MED ORDER — POTASSIUM CHLORIDE 10 MEQ/100ML IV SOLN
10.0000 meq | INTRAVENOUS | Status: AC
  Administered 2022-05-27 (×4): 10 meq via INTRAVENOUS
  Filled 2022-05-27 (×3): qty 100

## 2022-05-27 MED ORDER — ROSUVASTATIN CALCIUM 10 MG PO TABS
20.0000 mg | ORAL_TABLET | Freq: Every day | ORAL | Status: DC
  Administered 2022-05-27 – 2022-05-30 (×4): 20 mg via ORAL
  Filled 2022-05-27 (×4): qty 2

## 2022-05-27 MED ORDER — POTASSIUM CHLORIDE 10 MEQ/100ML IV SOLN
INTRAVENOUS | Status: AC
  Filled 2022-05-27: qty 100

## 2022-05-27 MED ORDER — METOPROLOL SUCCINATE ER 25 MG PO TB24
25.0000 mg | ORAL_TABLET | Freq: Every day | ORAL | Status: DC
  Administered 2022-05-27 – 2022-05-31 (×5): 25 mg via ORAL
  Filled 2022-05-27 (×5): qty 1

## 2022-05-27 MED ORDER — ALPRAZOLAM 0.5 MG PO TABS
0.5000 mg | ORAL_TABLET | Freq: Every evening | ORAL | Status: DC | PRN
  Administered 2022-05-27 – 2022-05-30 (×4): 0.5 mg via ORAL
  Filled 2022-05-27 (×4): qty 1

## 2022-05-27 NOTE — Progress Notes (Signed)
Subjective/Chief Complaint: Patient had 2 bowel movements yesterday.  No bowel movements yet today.  He was placed back on suction overnight.  Unclear of why.  He did have some nausea but no vomiting.  Of note his NG tube was initially in his duodenum so most of what is coming out is bilious which is not expected by tube location on KUB.  Denies any significant abdominal pain this morning.   Objective: Vital signs in last 24 hours: Temp:  [98 F (36.7 C)-98.3 F (36.8 C)] 98.1 F (36.7 C) (09/16 0502) Pulse Rate:  [85-107] 85 (09/16 0502) Resp:  [16-19] 19 (09/16 0502) BP: (133-141)/(84-93) 133/84 (09/16 0502) SpO2:  [93 %-96 %] 96 % (09/16 0502) Last BM Date : 05/25/22  Intake/Output from previous day: 09/15 0701 - 09/16 0700 In: 60 [NG/GT:60] Out: 1550 [Emesis/NG output:1550] Intake/Output this shift: No intake/output data recorded.   Gen:  Alert, NAD, pleasant Card:  Regular rate and rhythm Pulm:  Normal effort Abd: Soft, mild distention, nontender, NG with bilious effluent - Skin: warm and dry, no rashes  Psych: A&Ox3  Lab Results:  Recent Labs    05/25/22 0534 05/27/22 0553  WBC 4.6 4.5  HGB 14.8 13.6  HCT 44.0 40.8  PLT 307 249   BMET Recent Labs    05/25/22 0534 05/27/22 0553  NA 140 144  K 3.7 3.1*  CL 107 107  CO2 27 30  GLUCOSE 136* 134*  BUN 23 33*  CREATININE 1.00 0.94  CALCIUM 9.1 8.3*   PT/INR No results for input(s): "LABPROT", "INR" in the last 72 hours. ABG No results for input(s): "PHART", "HCO3" in the last 72 hours.  Invalid input(s): "PCO2", "PO2"  Studies/Results: DG Abd Portable 1V  Result Date: 05/26/2022 CLINICAL DATA:  Small-bowel obstruction. EXAM: PORTABLE ABDOMEN - 1 VIEW COMPARISON:  Abdominal radiograph 05/25/2022 FINDINGS: Enteric tube courses below diaphragm with the side hole terminating in the distal stomach and the tip terminating in the region of the pylorus/proximal duodenum. The enteric tube appears slightly  retracted compared to 05/25/2022. There are persistently dilated loops of bowel in the central abdomen measuring up to 4.7 cm, which is not significantly changed compared to prior exam. There is no evidence of pneumatosis. No supine evidence of pneumoperitoneum. Pelvic phlebolith is visualized. There are multilevel degenerative changes in the lower lumbar spine. Visualized lung bases are unremarkable. IMPRESSION: 1. Redemonstrated dilated loops of small bowel in the central and left upper quadrant, not significantly changed compared to prior exam, compatible with known small bowel obstruction. 2. Compared to prior exam there is slight interval retraction of the enteric tube which still terminates in the peripyloric region. Electronically Signed   By: Marin Roberts M.D.   On: 05/26/2022 08:05    Anti-infectives: Anti-infectives (From admission, onward)    None       Assessment/Plan: Small bowel obstruction, suspect related to intra-abdominal adhesions - Afebrile, vital stable, no leukocytosis - CT scan of the abdomen and pelvis shows a transition point in the mid abdomen, radiologist raises concern for possible internal hernia   Clinically, the patient does not have signs or symptoms of bowel ischemia-his heart rate and vitals are normal, he has no leukocytosis, his abdomen is minimally tender on exam without rebound tenderness.  His lactate has normalized. - SBO protocol 9/13 >> repeat KUB today  Clamp NG tube and allow clears   -Discussion with patient and wife about future plans.  Unclear why he was hooked back  to suction last night.  NG tube is in the duodenum therefore most of the drainage will be bilious which is not unexpected.  Unclear of exactly where he is in this process.  I recommend clamping his NG tube for today.  I recommended starting clear liquids to see how he tolerates this.  If he tolerates this, his NG tube can be removed more than likely tomorrow depending on his overall  clinical response.  If he does not, he more than likely would benefit from laparotomy either Sunday or Monday depending on OR schedule since his abdomen is actually quite benign on examination.  He more than likely has a high-grade small bowel obstruction.  He has had numerous operations and he does have urological issues that may require operative intervention.  Had a long discussion about how all of these findings and procedures should lay out.  Obviously, it is best that he avoid a laparotomy so we can get his urological issues addressed but if he is not progress in the next 24 hours improved, he probably will not it would benefit from laparotomy.  He does not have peritonitis or emergent need for surgery his white count is normal.  I explained all this to his wife and they both understand agree with this plan of care.    FEN -n.p.o., IV fluids, NG clamped  VTE - SCD's, ok for DVT ppx from [facility] standpoint ID -none indicated Admit -TRH service     HTN HLD PMH Prostate CA Incidental finding of a small bladder mass and small renal mass on CT, will need outpatient follow-up with urology       I reviewed nursing notes, hospitalist notes, last 24 h vitals and pain scores, last 48 h intake and output, last 24 h labs and trends, and last 24 h imaging results.   LOS: 3 days   Total time 30 minutes Turner Daniels MD  05/27/2022

## 2022-05-27 NOTE — Progress Notes (Signed)
PROGRESS NOTE Troy [name]  ONG:[id number] DOB: 02-02-1946 DOA: 05/24/2022 PCP: Derinda Late, MD   Brief Narrative/Hospital Course: 76 y.o. male with medical history significant of prostate CA, HLD, HTN. Presenting with nausea/vomiting and abdominal pain. CT abd and pelvis High-grade Small-bowel Obstruction. Scattered free fluid in the abdomen and pelvis. Abrupt transition point in the mid abdomen on series 2, image 56. Main differential considerations are obstruction due to adhesion or internal hernia. He was admitted for SBO and General surgery was consulted-being managed conservatively with NGT decompression.    Subjective: Seen and examined this morning.  NG tube clamped.  He was ambulating.  Resting comfortably with family at the bedside bench. No flatus or BM today.   Assessment and Plan: Principal Problem:   SBO (small bowel obstruction) (HCC) Active Problems:   Bladder mass   Renal mass   Anxiety   HTN (hypertension)   HLD (hyperlipidemia)  Small bowel obstruction likely due to intra-abdominal adhesion: CT showed transition point in the mid abdomen, possible internal hernia per report, being managed by neurosurgery with NG tube decompression.  NG tube clamped.  Watch for signs symptoms of bowel ischemia but seems to be clinically improving.  Per surgery allowing clears.  Cont Plan per [facility].  Hypokalemia repleting IV.  Monitor medically stable Recent Labs  Lab 05/24/22 0917 05/25/22 0534 05/27/22 0553  K 3.7 3.7 3.1*    New bladder and right renal mass: He has been recommended outpatient follow-up with [facility] urology  HTN HLD: BP is well controlled.  At home on HCTZ/losartan/metoprolol and Crestor.  Resume metoprolol Crestor as able  Anxiety: Stable,Resume xanax prn Lactic acidosis on admission likely from dehydration resolved Class I Obesity:Patient's Body mass index is 34.7 kg/m. : Will benefit with PCP follow-up, weight loss  healthy lifestyle and outpatient sleep  evaluation.  DVT prophylaxis: enoxaparin (LOVENOX) injection 40 mg Start: 05/24/22 2200 Code Status:   Code Status: Full Code Family Communication: plan of care discussed with patient/wife at bedside. Patient status is: Inpatient because of SBO Level of care: Med-Surg   Dispo: The patient is from: Home            Anticipated disposition: Home in 2 days once cleared by surgery  Objective: Vitals last 24 hrs: Vitals:   05/26/22 0426 05/26/22 1539 05/26/22 1950 05/27/22 0502  BP: 121/80 (!) 140/93 (!) 141/84 133/84  Pulse: (!) 105 (!) 107 93 85  Resp: '19 16 18 19  '$ Temp: (!) 97.3 F (36.3 C) 98.3 F (36.8 C) 98 F (36.7 C) 98.1 F (36.7 C)  TempSrc:   Oral Oral  SpO2: 92% 93% 95% 96%  Weight:      Height:       Weight change:   Physical Examination: General exam: alert awake,older than stated age, weak appearing. HEENT:Oral mucosa moist, Ear/Nose WNL grossly, dentition normal. Respiratory system: bilaterally diminished BS, no use of accessory muscle Cardiovascular system: S1 & S2 +, No JVD. Gastrointestinal system: Abdomen soft,NT,ND, BS sluggish .ngt+ Nervous System:Alert, awake, moving extremities and grossly nonfocal Extremities: LE edema neg,distal peripheral pulses palpable.  Skin: No rashes,no icterus. MSK: Normal muscle bulk,tone, power  Medications reviewed:  Scheduled Meds:  enoxaparin (LOVENOX) injection  40 mg Subcutaneous Q24H   metoprolol succinate  25 mg Oral Daily   rosuvastatin  20 mg Oral QHS   Continuous Infusions:  potassium chloride 10 mEq (05/27/22 1042)    Diet Order  Diet clear liquid Room service appropriate? Yes; Fluid consistency: Thin  Diet effective now                  Unresulted Labs (From admission, onward)     Start     Ordered   05/31/22 0500  Creatinine, serum  (enoxaparin (LOVENOX)    CrCl >/= 30 ml/min)  Weekly,   R     Comments: while on enoxaparin therapy    05/24/22 1626   05/28/22 1696  Basic metabolic  panel  Tomorrow morning,   R        05/27/22 0750           Data Reviewed: I have personally reviewed following labs and imaging studies CBC: Recent Labs  Lab 05/24/22 0917 05/25/22 0534 05/27/22 0553  WBC 9.1 4.6 4.5  HGB 16.2 14.8 13.6  HCT 45.9 44.0 40.8  MCV 89.0 92.8 94.9  PLT 326 307 789   Basic Metabolic Panel: Recent Labs  Lab 05/24/22 0917 05/25/22 0534 05/27/22 0553 05/27/22 0753  NA 138 140 144  --   K 3.7 3.7 3.1*  --   CL 100 107 107  --   CO2 '25 27 30  '$ --   GLUCOSE 161* 136* 134*  --   BUN 23 23 33*  --   CREATININE 1.09 1.00 0.94  --   CALCIUM 10.2 9.1 8.3*  --   MG  --   --   --  2.3   GFR: Estimated Creatinine Clearance: 80.5 mL/min (by C-G formula based on SCr of 0.94 mg/dL). Liver Function Tests: Recent Labs  Lab 05/24/22 0917 05/25/22 0534  AST 14* 15  ALT 17 15  ALKPHOS 70 54  BILITOT 1.5* 1.1  PROT 7.8 6.3*  ALBUMIN 4.8 3.7   Recent Labs  Lab 05/24/22 0917  LIPASE <10*  Sepsis Labs: Recent Labs  Lab 05/24/22 1119 05/24/22 1649 05/24/22 1920 05/25/22 0534  LATICACIDVEN 2.5* 1.5 1.5 1.4   Radiology Studies: DG Abd 1 View  Result Date: 05/27/2022 CLINICAL DATA:  Small-bowel obstruction EXAM: ABDOMEN - 1 VIEW COMPARISON:  Previous studies including the examination of 05/26/2022 FINDINGS: No significant changes are noted in small bowel dilation. Small bowel loops measured up to 5.6 cm in diameter. Colon is not distended. Tip of enteric tube is seen in proximal duodenum. IMPRESSION: No significant interval changes are noted in small bowel dilation suggesting partial small bowel obstruction. Tip of enteric tube is seen in the region of proximal duodenum. Electronically Signed   By: Elmer Picker M.D.   On: 05/27/2022 10:22   DG Abd Portable 1V  Result Date: 05/26/2022 CLINICAL DATA:  Small-bowel obstruction. EXAM: PORTABLE ABDOMEN - 1 VIEW COMPARISON:  Abdominal radiograph 05/25/2022 FINDINGS: Enteric tube courses below  diaphragm with the side hole terminating in the distal stomach and the tip terminating in the region of the pylorus/proximal duodenum. The enteric tube appears slightly retracted compared to 05/25/2022. There are persistently dilated loops of bowel in the central abdomen measuring up to 4.7 cm, which is not significantly changed compared to prior exam. There is no evidence of pneumatosis. No supine evidence of pneumoperitoneum. Pelvic phlebolith is visualized. There are multilevel degenerative changes in the lower lumbar spine. Visualized lung bases are unremarkable. IMPRESSION: 1. Redemonstrated dilated loops of small bowel in the central and left upper quadrant, not significantly changed compared to prior exam, compatible with known small bowel obstruction. 2. Compared to prior exam there is slight interval  retraction of the enteric tube which still terminates in the peripyloric region. Electronically Signed   By: Marin Roberts M.D.   On: 05/26/2022 08:05     LOS: 3 days   Antonieta Pert, MD [facility]  05/27/2022, 11:17 AM

## 2022-05-27 NOTE — Progress Notes (Signed)
NG clamped about 0900 this morning. At this time, writer called to room by pt to say that he has a "stomach ache" and c/o bloating. Stomach visibly distended. NG unclamped, flushed, and suction started back. Pt refused pain meds. Feels he will feel better now that suction has been restarted.  Will continue to monitor.

## 2022-05-27 NOTE — Hospital Course (Signed)
76 y.o. male with medical history significant of prostate CA, HLD, HTN. Presenting with nausea/vomiting and abdominal pain. CT abd and pelvis High-grade Small-bowel Obstruction. Scattered free fluid in the abdomen and pelvis. Abrupt transition point in the mid abdomen on series 2, image 56. Main differential considerations are obstruction due to adhesion or internal hernia. He was admitted for SBO and General surgery was consulted-being managed conservatively with NGT decompression.

## 2022-05-28 ENCOUNTER — Encounter (HOSPITAL_COMMUNITY): Payer: Self-pay | Admitting: Internal Medicine

## 2022-05-28 DIAGNOSIS — K56609 Unspecified intestinal obstruction, unspecified as to partial versus complete obstruction: Secondary | ICD-10-CM | POA: Diagnosis not present

## 2022-05-28 LAB — BASIC METABOLIC PANEL
Anion gap: 6 (ref 5–15)
BUN: 25 mg/dL — ABNORMAL HIGH (ref 8–23)
CO2: 29 mmol/L (ref 22–32)
Calcium: 8.1 mg/dL — ABNORMAL LOW (ref 8.9–10.3)
Chloride: 105 mmol/L (ref 98–111)
Creatinine, Ser: 0.84 mg/dL (ref 0.61–1.24)
GFR, Estimated: >60 mL/min (ref >60)
Glucose, Bld: 121 mg/dL — ABNORMAL HIGH (ref 70–99)
Potassium: 2.9 mmol/L — ABNORMAL LOW (ref 3.5–5.1)
Sodium: 140 mmol/L (ref 135–145)

## 2022-05-28 LAB — POTASSIUM: Potassium: 3.3 mmol/L — ABNORMAL LOW (ref 3.5–5.1)

## 2022-05-28 MED ORDER — CHLORHEXIDINE GLUCONATE CLOTH 2 % EX PADS
6.0000 | MEDICATED_PAD | Freq: Once | CUTANEOUS | Status: AC
  Administered 2022-05-28: 6 via TOPICAL

## 2022-05-28 MED ORDER — POTASSIUM CHLORIDE CRYS ER 20 MEQ PO TBCR
20.0000 meq | EXTENDED_RELEASE_TABLET | Freq: Every day | ORAL | Status: DC
  Administered 2022-05-28: 20 meq via ORAL
  Filled 2022-05-28: qty 1

## 2022-05-28 MED ORDER — POTASSIUM CHLORIDE 10 MEQ/100ML IV SOLN: 10.0000 meq | INTRAVENOUS | Status: DC

## 2022-05-28 MED ORDER — POTASSIUM CHLORIDE CRYS ER 20 MEQ PO TBCR
40.0000 meq | EXTENDED_RELEASE_TABLET | Freq: Once | ORAL | Status: AC
  Administered 2022-05-28: 40 meq via ORAL
  Filled 2022-05-28: qty 2

## 2022-05-28 MED ORDER — POTASSIUM CHLORIDE 10 MEQ/100ML IV SOLN
10.0000 meq | INTRAVENOUS | Status: AC
  Administered 2022-05-28 (×3): 10 meq via INTRAVENOUS
  Filled 2022-05-28 (×4): qty 100

## 2022-05-28 MED ORDER — POTASSIUM CHLORIDE CRYS ER 20 MEQ PO TBCR
40.0000 meq | EXTENDED_RELEASE_TABLET | ORAL | Status: AC
  Administered 2022-05-28: 40 meq via ORAL
  Filled 2022-05-28: qty 2

## 2022-05-28 MED ORDER — CEFAZOLIN SODIUM-DEXTROSE 2-4 GM/100ML-% IV SOLN
2.0000 g | INTRAVENOUS | Status: AC
  Administered 2022-05-29: 2 g via INTRAVENOUS
  Filled 2022-05-28: qty 100

## 2022-05-28 MED ORDER — POTASSIUM CHLORIDE IN NACL 20-0.9 MEQ/L-% IV SOLN
INTRAVENOUS | Status: AC
  Filled 2022-05-28 (×3): qty 1000

## 2022-05-28 NOTE — Progress Notes (Signed)
PROGRESS NOTE Troy [name]  EXB:[id number] DOB: 10/08/1945 DOA: 05/24/2022 PCP: Derinda Late, MD   Brief Narrative/Hospital Course: 76 y.o. male with medical history significant of prostate CA, HLD, HTN. Presenting with nausea/vomiting and abdominal pain. CT abd and pelvis High-grade Small-bowel Obstruction. Scattered free fluid in the abdomen and pelvis. Abrupt transition point in the mid abdomen on series 2, image 56. Main differential considerations are obstruction due to adhesion or internal hernia. He was admitted for SBO and General surgery was consulted-being managed conservatively with NGT decompression.    Subjective: Seen and examined this morning Reported he had BM yesterday Overnight patient was complaining of distended abdomen bloating and pain felt better after NG was unclamped and placed back on suction Labs this morning showing low potassium NGT output 2000 cc   Assessment and Plan: Principal Problem:   SBO (small bowel obstruction) (HCC) Active Problems:   Bladder mass   Renal mass   Anxiety   HTN (hypertension)   HLD (hyperlipidemia)  Small bowel obstruction likely due to intra-abdominal adhesion: CT showed transition point in the mid abdomen, possible internal hernia per report, being managed by neurosurgery with NG tube decompression.  NG tube clamped 9/16>.  Did not tolerat was placed back on NG suctioning with improvement in bowel distention and pain.  Did have BM yesterday.  Seen by general surgery this morning after extensive discussion proceeding with surgical intervention tomorrow.  Keep on IV fluid hydration.   Hypokalemia: k still low will replete aggressively again p.o. and IV and add NS with Kcl> recheck potassium t later today,mag was stable in 2s. Recent Labs  Lab 05/24/22 0917 05/25/22 0534 05/27/22 0553 05/28/22 0528  K 3.7 3.7 3.1* 2.9*     New bladder and right renal mass: He has been recommended outpatient follow-up with [facility]  urology  HTN HLD: BP is well controlled.  At home on HCTZ/losartan/metoprolol and Crestor.cont metoprolol/crestor if able to take po  Anxiety: Stable cont prn xanax  Lactic acidosis on admission likely from dehydration resolved Class I Obesity:Patient's Body mass index is 34.7 kg/m.Will benefit with PCP follow-up, weight loss  healthy lifestyle and outpatient sleep evaluation.  DVT prophylaxis: enoxaparin (LOVENOX) injection 40 mg Start: 05/24/22 2200 Code Status:   Code Status: Full Code Family Communication: plan of care discussed with patient/wife at bedside. Patient status is: Inpatient because of SBO Level of care: Med-Surg   Dispo: The patient is from: Home            Anticipated disposition: TBD  Objective: Vitals last 24 hrs: Vitals:   05/27/22 0502 05/27/22 1239 05/27/22 1942 05/28/22 0526  BP: 133/84 (!) 140/91 (!) 148/80 134/82  Pulse: 85 95 67 66  Resp: '19 20 18 18  '$ Temp: 98.1 F (36.7 C) 98.5 F (36.9 C) 98.1 F (36.7 C) 98.4 F (36.9 C)  TempSrc: Oral Oral Oral Oral  SpO2: 96% 95% 96% 94%  Weight:      Height:       Weight change:   Physical Examination: General exam: Aaox3 NGT+, older than stated age, weak appearing. HEENT:Oral mucosa moist, Ear/Nose WNL grossly, dentition normal. Respiratory system: bilaterally diminished, no use of accessory muscle Cardiovascular system: S1 & S2 +, No JVD,. Gastrointestinal system: Abdomen soft, nontender full BS sluggish  Nervous System:Alert, awake, moving extremities and grossly nonfocal Extremities: LE ankle edema NEG, distal peripheral pulses palpable.  Skin: No rashes,no icterus. MSK: Normal muscle bulk,tone, power  Ngt+  Medications reviewed:  Scheduled Meds:  enoxaparin (LOVENOX) injection  40 mg Subcutaneous Q24H   metoprolol succinate  25 mg Oral Daily   potassium chloride  20 mEq Oral Daily   potassium chloride  40 mEq Oral STAT   rosuvastatin  20 mg Oral QHS   Continuous Infusions:  0.9 % NaCl  with KCl 20 mEq / L     potassium chloride      Diet Order             Diet clear liquid Room service appropriate? Yes; Fluid consistency: Thin  Diet effective now                  Unresulted Labs (From admission, onward)     Start     Ordered   05/31/22 0500  Creatinine, serum  (enoxaparin (LOVENOX)    CrCl >/= 30 ml/min)  Weekly,   R     Comments: while on enoxaparin therapy    05/24/22 1626           Data Reviewed: I have personally reviewed following labs and imaging studies CBC: Recent Labs  Lab 05/24/22 0917 05/25/22 0534 05/27/22 0553  WBC 9.1 4.6 4.5  HGB 16.2 14.8 13.6  HCT 45.9 44.0 40.8  MCV 89.0 92.8 94.9  PLT 326 307 941    Basic Metabolic Panel: Recent Labs  Lab 05/24/22 0917 05/25/22 0534 05/27/22 0553 05/27/22 0753 05/28/22 0528  NA 138 140 144  --  140  K 3.7 3.7 3.1*  --  2.9*  CL 100 107 107  --  105  CO2 '25 27 30  '$ --  29  GLUCOSE 161* 136* 134*  --  121*  BUN 23 23 33*  --  25*  CREATININE 1.09 1.00 0.94  --  0.84  CALCIUM 10.2 9.1 8.3*  --  8.1*  MG  --   --   --  2.3  --     GFR: Estimated Creatinine Clearance: 90.1 mL/min (by C-G formula based on SCr of 0.84 mg/dL). Liver Function Tests: Recent Labs  Lab 05/24/22 0917 05/25/22 0534  AST 14* 15  ALT 17 15  ALKPHOS 70 54  BILITOT 1.5* 1.1  PROT 7.8 6.3*  ALBUMIN 4.8 3.7    Recent Labs  Lab 05/24/22 0917  LIPASE <10*   Sepsis Labs: Recent Labs  Lab 05/24/22 1119 05/24/22 1649 05/24/22 1920 05/25/22 0534  LATICACIDVEN 2.5* 1.5 1.5 1.4    Radiology Studies: DG Abd 1 View  Result Date: 05/27/2022 CLINICAL DATA:  Small-bowel obstruction EXAM: ABDOMEN - 1 VIEW COMPARISON:  Previous studies including the examination of 05/26/2022 FINDINGS: No significant changes are noted in small bowel dilation. Small bowel loops measured up to 5.6 cm in diameter. Colon is not distended. Tip of enteric tube is seen in proximal duodenum. IMPRESSION: No significant interval  changes are noted in small bowel dilation suggesting partial small bowel obstruction. Tip of enteric tube is seen in the region of proximal duodenum. Electronically Signed   By: Elmer Picker M.D.   On: 05/27/2022 10:22     LOS: 4 days   Antonieta Pert, MD [facility]  05/28/2022, 8:08 AM

## 2022-05-28 NOTE — Progress Notes (Signed)
Subjective/Chief Complaint: Patient failed clamping trial yesterday.  Developed distention and was placed back to suction which made him feel better.  Currently he is comfortable reading the newspaper.  He had bowel movements yesterday though.  They are moderate in size according to his wife.   Objective: Vital signs in last 24 hours: Temp:  [98.1 F (36.7 C)-98.5 F (36.9 C)] 98.4 F (36.9 C) (09/17 0526) Pulse Rate:  [66-95] 66 (09/17 0526) Resp:  [18-20] 18 (09/17 0526) BP: (134-148)/(80-91) 134/82 (09/17 0526) SpO2:  [94 %-96 %] 94 % (09/17 0526) Last BM Date : 05/27/22  Intake/Output from previous day: 09/16 0701 - 09/17 0700 In: 180 [P.O.:120; NG/GT:60] Out: 2350 [Urine:350; Emesis/NG output:2000] Intake/Output this shift: No intake/output data recorded.   Gen:  Alert, NAD, pleasant Card:  Regular rate and rhythm Pulm:  Normal effort Abd: Soft, mild distention, nontender, NG with bilious effluent - Skin: warm and dry, no rashes  Psych: A&Ox3  Lab Results:  Recent Labs    05/27/22 0553  WBC 4.5  HGB 13.6  HCT 40.8  PLT 249   BMET Recent Labs    05/27/22 0553 05/28/22 0528  NA 144 140  K 3.1* 2.9*  CL 107 105  CO2 30 29  GLUCOSE 134* 121*  BUN 33* 25*  CREATININE 0.94 0.84  CALCIUM 8.3* 8.1*   PT/INR No results for input(s): "LABPROT", "INR" in the last 72 hours. ABG No results for input(s): "PHART", "HCO3" in the last 72 hours.  Invalid input(s): "PCO2", "PO2"  Studies/Results: DG Abd 1 View  Result Date: 05/27/2022 CLINICAL DATA:  Small-bowel obstruction EXAM: ABDOMEN - 1 VIEW COMPARISON:  Previous studies including the examination of 05/26/2022 FINDINGS: No significant changes are noted in small bowel dilation. Small bowel loops measured up to 5.6 cm in diameter. Colon is not distended. Tip of enteric tube is seen in proximal duodenum. IMPRESSION: No significant interval changes are noted in small bowel dilation suggesting partial small  bowel obstruction. Tip of enteric tube is seen in the region of proximal duodenum. Electronically Signed   By: Elmer Picker M.D.   On: 05/27/2022 10:22    Anti-infectives: Anti-infectives (From admission, onward)    None       Assessment/Plan:  Small bowel obstruction, suspect related to intra-abdominal adhesions - Afebrile, vital stable, no leukocytosis - CT scan of the abdomen and pelvis shows a transition point in the mid abdomen, radiologist raises concern for possible internal hernia   Clinically, the patient does not have signs or symptoms of bowel ischemia-his heart rate and vitals are normal, he has no leukocytosis, his abdomen is minimally tender on exam without rebound tenderness.  His lactate has normalized. - SBO protocol 9/13 >>         Repeat KUB shows no significant change in bowel gas pattern with persistent dilated small bowel.  He did have bowel function yesterday which he has had with gas.  I suspect he has a high-grade partial bowel obstruction.  I discussed this today at great length with the patient and his wife.  We had a lengthy discussion yesterday.  I do not think this will resolve at this point time with continued conservative management.  I think the chances that are less than 10%.  I recommended laparotomy tomorrow with Dr. Kieth Brightly.  He will be able to discuss this with the urologist as well since the patient will require work-up of his bladder mass in kidney mass.  Certainly his obstruction  needs to be resolved first prior to any major urologic surgery.  He has a benign examination at this point in time but it is unlikely after this trial of clamping and failure that he will get better without operative intervention.  I discussed the procedure at great length.  Discussed possibilities of bowel resection and less likely colostomy.  I discussed hernia formation wound infections in expected return to work and hospital discharge which can be somewhat vague  depending on intraoperative findings.  Other complications of bleeding, infection, DVT, cardiovascular event, death, ostomy formation, wound complications and bowel injury and/or fistula were discussed with his wife and patient today.  They are in agreement to proceed this will be posted for tomorrow.     FEN -n.p.o., IV fluids, NG clamped  VTE - SCD's, ok for DVT ppx from [facility] standpoint ID -none indicated Admit -TRH service     HTN HLD PMH Prostate CA Incidental finding of a small bladder mass and small renal mass on CT, will need outpatient follow-up with urology        I reviewed nursing notes, hospitalist notes, last 24 h vitals and pain scores, last 48 h intake and output, last 24 h labs and trends, and last 24 h imaging results.   LOS: 4 days    Joyice Faster Nicholes Hibler MD  05/28/2022    Total time 30 minutes discussing surgery, postop recovery, complications, face-to-face time, documentation, and review of vital signs, labs, imaging studies

## 2022-05-28 NOTE — Anesthesia Preprocedure Evaluation (Addendum)
Anesthesia Evaluation  Patient identified by MRN, date of birth, ID band Patient awake    Reviewed: Allergy & Precautions, NPO status , Patient's Chart, lab work & pertinent test results, reviewed documented beta blocker date and time   Airway Mallampati: I  TM Distance: >3 FB Neck ROM: Full    Dental no notable dental hx. (+) Caps, Dental Advisory Given   Pulmonary neg pulmonary ROS,    Pulmonary exam normal breath sounds clear to auscultation       Cardiovascular hypertension, Pt. on medications and Pt. on home beta blockers Normal cardiovascular exam Rhythm:Regular Rate:Normal     Neuro/Psych Anxiety  Neuromuscular disease    GI/Hepatic Neg liver ROS, GERD  Medicated,Small bowel obstruction Hx/ Nissan fundoplication   Endo/Other  Obesity Hyperlipidemia  Renal/GU negative Renal ROSRenal mass   Bladder mass Hx/o prostate Ca S/P prostatectomy    Musculoskeletal  (+) Arthritis , Osteoarthritis,    Abdominal (+) + obese,   Peds  Hematology  (+) Blood dyscrasia, anemia ,   Anesthesia Other Findings   Reproductive/Obstetrics                           Anesthesia Physical Anesthesia Plan  ASA: 3  Anesthesia Plan: General   Post-op Pain Management: Tylenol PO (pre-op)*, Precedex and Dilaudid IV   Induction: Intravenous and Cricoid pressure planned  PONV Risk Score and Plan: 4 or greater and Treatment may vary due to age or medical condition, Ondansetron and Dexamethasone  Airway Management Planned: Oral ETT  Additional Equipment: None  Intra-op Plan:   Post-operative Plan: Extubation in OR  Informed Consent: I have reviewed the patients History and Physical, chart, labs and discussed the procedure including the risks, benefits and alternatives for the proposed anesthesia with the patient or authorized representative who has indicated his/her understanding and acceptance.      Dental advisory given  Plan Discussed with: CRNA and Anesthesiologist  Anesthesia Plan Comments:        Anesthesia Quick Evaluation

## 2022-05-28 NOTE — Progress Notes (Signed)
A consult was placed to the IV Therapist for another new iv site;  pt has been receiving multiple runs of K, in addition to KCl in his fluids, and his ivs are infiltrating, and "hurt";  pt would benefit greatly from a PICC line for additional access and lab draws.  Have spoken to the pt and his wife and they are in agreement to having a PICC placed.  Thank you.

## 2022-05-29 ENCOUNTER — Inpatient Hospital Stay (HOSPITAL_COMMUNITY): Payer: Medicare Other | Admitting: Anesthesiology

## 2022-05-29 ENCOUNTER — Inpatient Hospital Stay: Payer: Self-pay

## 2022-05-29 ENCOUNTER — Encounter (HOSPITAL_COMMUNITY)
Admission: EM | Disposition: A | Discharge status: Home / Self Care | Payer: Self-pay | Source: Home / Self Care | Attending: Internal Medicine

## 2022-05-29 ENCOUNTER — Encounter (HOSPITAL_COMMUNITY): Payer: Self-pay | Admitting: Internal Medicine

## 2022-05-29 ENCOUNTER — Other Ambulatory Visit: Payer: Self-pay

## 2022-05-29 DIAGNOSIS — M1990 Unspecified osteoarthritis, unspecified site: Secondary | ICD-10-CM

## 2022-05-29 DIAGNOSIS — E785 Hyperlipidemia, unspecified: Secondary | ICD-10-CM

## 2022-05-29 DIAGNOSIS — K5710 Diverticulosis of small intestine without perforation or abscess without bleeding: Secondary | ICD-10-CM

## 2022-05-29 DIAGNOSIS — K56609 Unspecified intestinal obstruction, unspecified as to partial versus complete obstruction: Secondary | ICD-10-CM | POA: Diagnosis not present

## 2022-05-29 DIAGNOSIS — I10 Essential (primary) hypertension: Secondary | ICD-10-CM

## 2022-05-29 HISTORY — PX: LAPAROTOMY: SHX154

## 2022-05-29 LAB — BASIC METABOLIC PANEL
Anion gap: 6 (ref 5–15)
BUN: 22 mg/dL (ref 8–23)
CO2: 27 mmol/L (ref 22–32)
Calcium: 8.2 mg/dL — ABNORMAL LOW (ref 8.9–10.3)
Chloride: 109 mmol/L (ref 98–111)
Creatinine, Ser: 0.91 mg/dL (ref 0.61–1.24)
GFR, Estimated: >60 mL/min (ref >60)
Glucose, Bld: 106 mg/dL — ABNORMAL HIGH (ref 70–99)
Potassium: 3.5 mmol/L (ref 3.5–5.1)
Sodium: 142 mmol/L (ref 135–145)

## 2022-05-29 LAB — MAGNESIUM: Magnesium: 2.2 mg/dL (ref 1.7–2.4)

## 2022-05-29 LAB — PHOSPHORUS: Phosphorus: 3.4 mg/dL (ref 2.5–4.6)

## 2022-05-29 LAB — TYPE AND SCREEN
ABO/RH(D): A POS
Antibody Screen: NEGATIVE

## 2022-05-29 LAB — CBC
HCT: 39.1 % (ref 39.0–52.0)
Hemoglobin: 13.1 g/dL (ref 13.0–17.0)
MCH: 31.9 pg (ref 26.0–34.0)
MCHC: 33.5 g/dL (ref 30.0–36.0)
MCV: 95.1 fL (ref 80.0–100.0)
Platelets: 267 10*3/uL (ref 150–400)
RBC: 4.11 MIL/uL — ABNORMAL LOW (ref 4.22–5.81)
RDW: 12.4 % (ref 11.5–15.5)
WBC: 5.8 10*3/uL (ref 4.0–10.5)
nRBC: 0 % (ref 0.0–0.2)

## 2022-05-29 LAB — GLUCOSE, CAPILLARY: Glucose-Capillary: 188 mg/dL — ABNORMAL HIGH (ref 70–99)

## 2022-05-29 LAB — POTASSIUM: Potassium: 3.9 mmol/L (ref 3.5–5.1)

## 2022-05-29 SURGERY — LAPAROTOMY, EXPLORATORY
Anesthesia: General | Site: Abdomen

## 2022-05-29 MED ORDER — BUPIVACAINE-EPINEPHRINE (PF) 0.25% -1:200000 IJ SOLN
INTRAMUSCULAR | Status: AC
  Filled 2022-05-29: qty 30

## 2022-05-29 MED ORDER — FENTANYL CITRATE (PF) 100 MCG/2ML IJ SOLN
INTRAMUSCULAR | Status: AC
  Filled 2022-05-29: qty 2

## 2022-05-29 MED ORDER — SODIUM CHLORIDE 0.9 % IV SOLN: INTRAVENOUS | Status: AC

## 2022-05-29 MED ORDER — ONDANSETRON HCL 4 MG/2ML IJ SOLN
INTRAMUSCULAR | Status: DC | PRN
  Administered 2022-05-29: 4 mg via INTRAVENOUS

## 2022-05-29 MED ORDER — ONDANSETRON HCL 4 MG/2ML IJ SOLN: 4.0000 mg | Freq: Four times a day (QID) | INTRAMUSCULAR | Status: DC | PRN

## 2022-05-29 MED ORDER — SUGAMMADEX SODIUM 500 MG/5ML IV SOLN
INTRAVENOUS | Status: AC
  Filled 2022-05-29: qty 5

## 2022-05-29 MED ORDER — FENTANYL CITRATE (PF) 250 MCG/5ML IJ SOLN
INTRAMUSCULAR | Status: AC
  Filled 2022-05-29: qty 5

## 2022-05-29 MED ORDER — ORAL CARE MOUTH RINSE: 15.0000 mL | Freq: Once | OROMUCOSAL | Status: AC

## 2022-05-29 MED ORDER — PROPOFOL 10 MG/ML IV BOLUS
INTRAVENOUS | Status: AC
  Filled 2022-05-29: qty 20

## 2022-05-29 MED ORDER — MORPHINE SULFATE (PF) 2 MG/ML IV SOLN: 2.0000 mg | INTRAVENOUS | Status: DC | PRN

## 2022-05-29 MED ORDER — PROPOFOL 10 MG/ML IV BOLUS
INTRAVENOUS | Status: DC | PRN
  Administered 2022-05-29: 150 mg via INTRAVENOUS

## 2022-05-29 MED ORDER — LIDOCAINE HCL (PF) 2 % IJ SOLN
INTRAMUSCULAR | Status: AC
  Filled 2022-05-29: qty 5

## 2022-05-29 MED ORDER — FENTANYL CITRATE (PF) 100 MCG/2ML IJ SOLN
INTRAMUSCULAR | Status: DC | PRN
  Administered 2022-05-29: 100 ug via INTRAVENOUS
  Administered 2022-05-29: 50 ug via INTRAVENOUS
  Administered 2022-05-29: 100 ug via INTRAVENOUS

## 2022-05-29 MED ORDER — OXYCODONE HCL 5 MG/5ML PO SOLN: 5.0000 mg | Freq: Once | ORAL | Status: DC | PRN

## 2022-05-29 MED ORDER — 0.9 % SODIUM CHLORIDE (POUR BTL) OPTIME
TOPICAL | Status: DC | PRN
  Administered 2022-05-29: 1000 mL

## 2022-05-29 MED ORDER — MIDAZOLAM HCL 2 MG/2ML IJ SOLN
INTRAMUSCULAR | Status: AC
  Filled 2022-05-29: qty 2

## 2022-05-29 MED ORDER — PHENYLEPHRINE HCL (PRESSORS) 10 MG/ML IV SOLN
INTRAVENOUS | Status: AC
  Filled 2022-05-29: qty 1

## 2022-05-29 MED ORDER — CHLORHEXIDINE GLUCONATE 0.12 % MT SOLN
15.0000 mL | Freq: Once | OROMUCOSAL | Status: AC
  Administered 2022-05-29: 15 mL via OROMUCOSAL

## 2022-05-29 MED ORDER — ROCURONIUM BROMIDE 10 MG/ML (PF) SYRINGE
PREFILLED_SYRINGE | INTRAVENOUS | Status: AC
  Filled 2022-05-29: qty 10

## 2022-05-29 MED ORDER — DEXAMETHASONE SODIUM PHOSPHATE 10 MG/ML IJ SOLN
INTRAMUSCULAR | Status: AC
  Filled 2022-05-29: qty 1

## 2022-05-29 MED ORDER — DEXMEDETOMIDINE HCL IN NACL 80 MCG/20ML IV SOLN
INTRAVENOUS | Status: DC | PRN
  Administered 2022-05-29: 8 ug via BUCCAL

## 2022-05-29 MED ORDER — LIDOCAINE 2% (20 MG/ML) 5 ML SYRINGE
INTRAMUSCULAR | Status: DC | PRN
  Administered 2022-05-29: 80 mg via INTRAVENOUS

## 2022-05-29 MED ORDER — ONDANSETRON HCL 4 MG/2ML IJ SOLN: 4.0000 mg | Freq: Once | INTRAMUSCULAR | Status: DC | PRN

## 2022-05-29 MED ORDER — ROCURONIUM BROMIDE 10 MG/ML (PF) SYRINGE
PREFILLED_SYRINGE | INTRAVENOUS | Status: DC | PRN
  Administered 2022-05-29: 80 mg via INTRAVENOUS

## 2022-05-29 MED ORDER — OXYCODONE HCL 5 MG PO TABS: 5.0000 mg | ORAL_TABLET | Freq: Once | ORAL | Status: DC | PRN

## 2022-05-29 MED ORDER — SODIUM CHLORIDE 0.9% FLUSH: 10.0000 mL | INTRAVENOUS | Status: DC | PRN

## 2022-05-29 MED ORDER — LACTATED RINGERS IV SOLN: INTRAVENOUS | Status: DC

## 2022-05-29 MED ORDER — OXYCODONE HCL 5 MG PO TABS
5.0000 mg | ORAL_TABLET | ORAL | Status: DC | PRN
  Administered 2022-05-29: 5 mg via ORAL
  Filled 2022-05-29: qty 1

## 2022-05-29 MED ORDER — ONDANSETRON HCL 4 MG/2ML IJ SOLN
INTRAMUSCULAR | Status: AC
  Filled 2022-05-29: qty 2

## 2022-05-29 MED ORDER — BUPIVACAINE-EPINEPHRINE 0.25% -1:200000 IJ SOLN
INTRAMUSCULAR | Status: DC | PRN
  Administered 2022-05-29: 30 mL

## 2022-05-29 MED ORDER — ACETAMINOPHEN 500 MG PO TABS: 1000.0000 mg | ORAL_TABLET | Freq: Three times a day (TID) | ORAL | Status: DC | PRN

## 2022-05-29 MED ORDER — TRAVASOL 10 % IV SOLN
INTRAVENOUS | Status: AC
  Filled 2022-05-29: qty 480

## 2022-05-29 MED ORDER — DEXAMETHASONE SODIUM PHOSPHATE 10 MG/ML IJ SOLN
INTRAMUSCULAR | Status: DC | PRN
  Administered 2022-05-29: 8 mg via INTRAVENOUS

## 2022-05-29 MED ORDER — INSULIN ASPART 100 UNIT/ML IJ SOLN
0.0000 [IU] | Freq: Three times a day (TID) | INTRAMUSCULAR | Status: DC
  Administered 2022-05-29: 0 [IU] via SUBCUTANEOUS
  Administered 2022-05-30 – 2022-05-31 (×2): 1 [IU] via SUBCUTANEOUS

## 2022-05-29 MED ORDER — EPHEDRINE 5 MG/ML INJ
INTRAVENOUS | Status: AC
  Filled 2022-05-29: qty 5

## 2022-05-29 MED ORDER — AMISULPRIDE (ANTIEMETIC) 5 MG/2ML IV SOLN: 10.0000 mg | Freq: Once | INTRAVENOUS | Status: DC | PRN

## 2022-05-29 MED ORDER — EPHEDRINE SULFATE-NACL 50-0.9 MG/10ML-% IV SOSY
PREFILLED_SYRINGE | INTRAVENOUS | Status: DC | PRN
  Administered 2022-05-29: 10 mg via INTRAVENOUS

## 2022-05-29 MED ORDER — CHLORHEXIDINE GLUCONATE CLOTH 2 % EX PADS
6.0000 | MEDICATED_PAD | Freq: Every day | CUTANEOUS | Status: DC
  Administered 2022-05-30 – 2022-05-31 (×2): 6 via TOPICAL

## 2022-05-29 MED ORDER — SUGAMMADEX SODIUM 500 MG/5ML IV SOLN
INTRAVENOUS | Status: DC | PRN
  Administered 2022-05-29: 300 mg via INTRAVENOUS

## 2022-05-29 MED ORDER — HYDROMORPHONE HCL 1 MG/ML IJ SOLN: 0.2500 mg | INTRAMUSCULAR | Status: DC | PRN

## 2022-05-29 MED ORDER — POTASSIUM CHLORIDE 10 MEQ/100ML IV SOLN
10.0000 meq | INTRAVENOUS | Status: AC
  Administered 2022-05-29: 10 meq via INTRAVENOUS
  Filled 2022-05-29: qty 100

## 2022-05-29 SURGICAL SUPPLY — 55 items
ADH SKN CLS APL DERMABOND .7 (GAUZE/BANDAGES/DRESSINGS) ×2
APL PRP STRL LF DISP 70% ISPRP (MISCELLANEOUS) ×2
BAG COUNTER SPONGE SURGICOUNT (BAG) IMPLANT
BAG SPNG CNTER NS LX DISP (BAG)
CELLS DAT CNTRL 66122 CELL SVR (MISCELLANEOUS) ×2 IMPLANT
CHLORAPREP W/TINT 26 (MISCELLANEOUS) ×2 IMPLANT
COVER MAYO STAND STRL (DRAPES) ×1 IMPLANT
COVER SURGICAL LIGHT HANDLE (MISCELLANEOUS) ×2 IMPLANT
DERMABOND ADVANCED .7 DNX12 (GAUZE/BANDAGES/DRESSINGS) ×1 IMPLANT
DRAPE LAPAROSCOPIC ABDOMINAL (DRAPES) ×2 IMPLANT
DRAPE UTILITY XL STRL (DRAPES) ×1 IMPLANT
DRAPE WARM FLUID 44X44 (DRAPES) ×2 IMPLANT
DRSG OPSITE POSTOP 4X10 (GAUZE/BANDAGES/DRESSINGS) IMPLANT
DRSG OPSITE POSTOP 4X6 (GAUZE/BANDAGES/DRESSINGS) IMPLANT
DRSG OPSITE POSTOP 4X8 (GAUZE/BANDAGES/DRESSINGS) IMPLANT
ELECT REM PT RETURN 15FT ADLT (MISCELLANEOUS) ×2 IMPLANT
GLOVE BIOGEL PI IND STRL 7.0 (GLOVE) ×2 IMPLANT
GLOVE SURG SS PI 7.0 STRL IVOR (GLOVE) ×2 IMPLANT
GOWN STRL REUS W/ TWL LRG LVL3 (GOWN DISPOSABLE) ×2 IMPLANT
GOWN STRL REUS W/ TWL XL LVL3 (GOWN DISPOSABLE) IMPLANT
GOWN STRL REUS W/TWL LRG LVL3 (GOWN DISPOSABLE) ×2
GOWN STRL REUS W/TWL XL LVL3 (GOWN DISPOSABLE)
HANDLE SUCTION POOLE (INSTRUMENTS) ×1 IMPLANT
KIT BASIN OR (CUSTOM PROCEDURE TRAY) ×2 IMPLANT
KIT TURNOVER KIT A (KITS) IMPLANT
LIGASURE IMPACT 36 18CM CVD LR (INSTRUMENTS) ×1 IMPLANT
PACK GENERAL/GYN (CUSTOM PROCEDURE TRAY) ×1 IMPLANT
RELOAD PROXIMATE 100 BLUE (MISCELLANEOUS) IMPLANT
RELOAD PROXIMATE 100MM BLUE (MISCELLANEOUS)
RELOAD PROXIMATE 75MM BLUE (ENDOMECHANICALS) ×4 IMPLANT
RELOAD STAPLE 100 3.8 BLU REG (MISCELLANEOUS) IMPLANT
RELOAD STAPLE 75 3.8 BLU REG (ENDOMECHANICALS) IMPLANT
RETRACTOR WND ALEXIS 18 MED (MISCELLANEOUS) IMPLANT
RTRCTR WOUND ALEXIS 18CM MED (MISCELLANEOUS) ×2
SLEEVE Z-THREAD 5X100MM (TROCAR) ×2 IMPLANT
STAPLER GUN LINEAR PROX 60 (STAPLE) ×1 IMPLANT
STAPLER PROXIMATE 100MM BLUE (MISCELLANEOUS) IMPLANT
STAPLER PROXIMATE 75MM BLUE (STAPLE) ×1 IMPLANT
STAPLER VISISTAT 35W (STAPLE) ×1 IMPLANT
SUCTION POOLE HANDLE (INSTRUMENTS)
SUT MNCRL AB 4-0 PS2 18 (SUTURE) IMPLANT
SUT PDS AB 0 CT1 36 (SUTURE) ×2 IMPLANT
SUT SILK 2 0 (SUTURE)
SUT SILK 2 0 SH CR/8 (SUTURE) ×2 IMPLANT
SUT SILK 2-0 18XBRD TIE 12 (SUTURE) ×1 IMPLANT
SUT SILK 3 0 (SUTURE)
SUT SILK 3 0 SH CR/8 (SUTURE) ×3 IMPLANT
SUT SILK 3-0 18XBRD TIE 12 (SUTURE) ×1 IMPLANT
TOWEL OR 17X26 10 PK STRL BLUE (TOWEL DISPOSABLE) ×2 IMPLANT
TOWEL OR NON WOVEN STRL DISP B (DISPOSABLE) ×2 IMPLANT
TRAY FOLEY MTR SLVR 14FR STAT (SET/KITS/TRAYS/PACK) ×1 IMPLANT
TRAY FOLEY MTR SLVR 16FR STAT (SET/KITS/TRAYS/PACK) ×2 IMPLANT
TRAY LAPAROSCOPIC (CUSTOM PROCEDURE TRAY) ×1 IMPLANT
TROCAR Z-THREAD OPTICAL 5X100M (TROCAR) ×1 IMPLANT
YANKAUER SUCT BULB TIP NO VENT (SUCTIONS) ×2 IMPLANT

## 2022-05-29 NOTE — Op Note (Addendum)
Preoperative diagnosis: small bowel obstruction  Postoperative diagnosis: small bowel diverticular disease  Procedure: laparoscopic small bowel resection with anastomosis  Surgeon: Gurney Maxin, M.D.  Asst: Toula Moos  Anesthesia: general  Indications for procedure: Troy [name] is a 76 y.o. year old male with symptoms of abdominal pain, vomiting, and nausea. He initially improved but then continued to have symptoms. He presents for exploration.  Description of procedure: The patient was brought into the operative suite. Anesthesia was administered with General endotracheal anesthesia. WHO checklist was applied. The patient was then placed in supine position. The area was prepped and draped in the usual sterile fashion.  Next, a left subcostal incision was made. A 51m trocar was used to gain access to the peritoneal cavity by optical entry technique. Pneumoperitoneum was applied with a high flow and low pressure. The laparoscope was reinserted to confirm position.  Upon initial examination, there was some omentum adhered to the abdominal wall with an intraperitoneal mesh well incorporated in the periumbilical area. 2 additional trocars were placed, 1 5 mm in the left mid abdomen and 1 5 mm trocar in the left lower quadrant. Bilateral TAP blocks were placed with Marcaine.  The cecum and appendix were identified. The small intestine was run retrograde. There was a caliber change to dilation but no adhesions to the small intestine. In the last 4 feet of jejunum there was a 40 cm segment with multiple small to large diverticula. Decision was made to resect the area.  A small upper midline incision was made. Cautery was used to dissect through the subcutaneous tissue and fascia. Wound protector was placed. Small intestine was extracted and portion before and after the diverticulum were chosen for resection edges. 75 mm GIA blue load staplers were used to divide the small intestine. Ligasure  was used to take the mesentery. THe anastomosis was performed with side to side functional end to end stapled anastomosis with 75 mm blue GIA. The enterotomy was closed with 60 mm TA stapler. Stapled edges were imbricated with 3-0 silk Lembert sutures. The mesenteric defect was closed with 2-0 silk figure of 8 sutures. The small bowel was placed back into the abdomen. The fascia was closed with 0 PDS in running fashion. The skin incisions were closed with 4-0 monocryl subcuticular stitches. Dermabond was put in place for dressing.  Findings: 40 cm piece of jejunum with small to large diverticula  Specimen: jejunum  Implant: none   Blood loss: 30 ml  Local anesthesia:  30 ml marcaine   Complications: none  LGurney Maxin M.D. General, Bariatric, & Minimally Invasive Surgery [facility]

## 2022-05-29 NOTE — Progress Notes (Signed)
PHARMACY - TOTAL PARENTERAL NUTRITION CONSULT NOTE   Indication: Prolonged ileus anticipated  Patient Measurements: Height: '5\' 9"'$  (175.3 cm) Weight: 106.6 kg (235 lb) IBW/kg (Calculated) : 70.7 TPN AdjBW (KG): 79.7 Body mass index is 34.7 kg/m. Usual Weight:   Assessment:  76 y.o. male with hx of prostate CA, HLD, HTN who presents with nausea/vomiting and abdominal pain. CT abd and pelvis high-grade SBO, scattered free fluid in the abdomen and pelvis.  CCS consulted and managing conservatively with NGT, but patient did not tolerate clamping and now planning laparotomy 9/18.  Glucose / Insulin: No hx DM, serum glucose 106 Electrolytes: K+ at low end of goal range after IV replacement 9/16, 9/17. KDur PO daily ordered currently but now NPO so will d/c. Renal: SCr < 1 Hepatic: WNL on 9/14 Intake / Output: +899, NG 600cc, LBM 9/16, UOP 450cc recorded (accuracy?) MIVF: NS w/ KCl 20/L at 75 ml/hr GI Imaging: - 9/13 CT: High-grade Small-bowel Obstruction. Scattered free fluid in the abdomen and pelvis. - 9/16 KUB shows no significant change in bowel gas pattern with persistent dilated small bowel. GI Surgeries / Procedures:  - 9/18: plan laparotomy   Central access: plan PICC 9/18 TPN start date: plan 9/18  Nutritional Goals: Goal TPN rate is -- mL/hr (provides -- g of protein and -- kcals per day)  RD Assessment: pending    Current Nutrition:  NPO  Plan:  Now: KCl 33mq x 3 runs  Start TPN at 435mhr at 1800 This will provide 48g protein, 969kcal Electrolytes in TPN:  Na 5025mL,  K 58m27m,  Ca 5mEq17m  Mg 5mEq/25m Phos 15mmol76m Cl:Ac 1:1 Add standard MVI and trace elements to TPN Initiate Sensitive q8h SSI and adjust as needed  Reduce MIVF to 35 mL/hr at 1800 and change to NS Monitor TPN labs on Mon/Thurs, CMET/Mg/Phos in AM  Ziad Maye WiPeggyann JubaD, BCPS Pharmacy: [phone],9:01 AM

## 2022-05-29 NOTE — Progress Notes (Signed)
PROGRESS NOTE Troy [name]  AOZ:[id number] DOB: Aug 21, 1946 DOA: 05/24/2022 PCP: Derinda Late, MD   Brief Narrative/Hospital Course: 76 y.o. male with medical history significant of prostate CA, HLD, HTN. Presenting with nausea/vomiting and abdominal pain. CT abd and pelvis High-grade Small-bowel Obstruction. Scattered free fluid in the abdomen and pelvis. Abrupt transition point in the mid abdomen on series 2, image 56. Main differential considerations are obstruction due to adhesion or internal hernia. He was admitted for SBO and General surgery was consulted-being managed conservatively with NGT decompression.  NG tube clamped however patient did not tolerate had worsening distention pain, and will improved after NG tube suction.  Surgery has reevaluated and at this time proceeding for laparotomy 9/18.    Subjective: Seen and examined. Multiple family is at bedside. Overnight NG tube dislodged, patient declined for reinsertion as he is going for surgery today Labs with improved electrolytes, afebrile overnight   Assessment and Plan: Principal Problem:   SBO (small bowel obstruction) (HCC) Active Problems:   Bladder mass   Renal mass   Anxiety   HTN (hypertension)   HLD (hyperlipidemia)  Small bowel obstruction likely due to intra-abdominal adhesion: CT showed transition point in the mid abdomen, possible internal hernia per report.  Patient has NGT clamp trial but failed Friday and Saturday, has had a small bowel movement and a small amount of flatus, but had persistently high NG output Friday/Saturday along with distention.  KUB showed persistently dilated small bowel loops.surgery following and they are planning for diagnostic laparoscopy/OR today- will defer ongoing management to general surgery team.Continue IV fluids, pain control as per surgery.  Hypokalemia: Aggressively repleted.  Has resolved.  Continue IVF with KCl  Recent Labs  Lab 05/25/22 0534 05/27/22 0553  05/28/22 0528 05/28/22 1349 05/29/22 0458  K 3.7 3.1* 2.9* 3.3* 3.5    New bladder and right renal mass: He has been recommended outpatient follow-up with [facility] urology  HTN HLD: BP is well controlled.At home on HCTZ/losartan/metoprolol and Crestor.cont metoprolol/crestor as able.  Continue as needed if needed   Anxiety: Stable on  prn xanax  Lactic acidosis on admission likely from dehydration resolved. Class I Obesity:Patient's Body mass index is 34.7 kg/m.Will benefit with PCP follow-up, weight loss  healthy lifestyle and outpatient sleep evaluation.  DVT prophylaxis: SCD's Start: 05/28/22 2016 enoxaparin (LOVENOX) injection 40 mg Start: 05/24/22 2200 Code Status:   Code Status: Full Code Family Communication: plan of care discussed with patient/family and wife at bedside. Patient status is: Inpatient because of SBO  Level of care: Med-Surg  Dispo:The patient is from: Home           Anticipated disposition: TBD  Objective: Vitals last 24 hrs: Vitals:   05/28/22 0526 05/28/22 1241 05/28/22 1945 05/29/22 0646  BP: 134/82 (!) 128/97 (!) 147/80 135/75  Pulse: 66 87 73 72  Resp: '18 20  18  '$ Temp: 98.4 F (36.9 C) 98 F (36.7 C) 98.3 F (36.8 C) 97.8 F (36.6 C)  TempSrc: Oral Oral Oral Oral  SpO2: 94% 96% 98% 94%  Weight:      Height:       Weight change:   Physical Examination: General exam: AAox3, comfortable, HEENT:Oral mucosa moist, Ear/Nose WNL grossly, dentition normal. Respiratory system: bilaterally diminished, no use of accessory muscle Cardiovascular system: S1 & S2 +, No JVD. Gastrointestinal system: Abdomen soft,NT,ND,BS sluggish Nervous System:Alert, awake, moving extremities and grossly nonfocal Extremities: LE ankle edema neg, distal peripheral pulses palpable.  Skin: No rashes,no icterus.  MSK: Normal muscle bulk,tone, power   Medications reviewed:  Scheduled Meds:  enoxaparin (LOVENOX) injection  40 mg Subcutaneous Q24H   metoprolol succinate  25  mg Oral Daily   potassium chloride  20 mEq Oral Daily   rosuvastatin  20 mg Oral QHS   Continuous Infusions:  0.9 % NaCl with KCl 20 mEq / L 75 mL/hr at 05/29/22 0130    ceFAZolin (ANCEF) IV      Diet Order             Diet NPO time specified  Diet effective midnight                  Unresulted Labs (From admission, onward)     Start     Ordered   05/31/22 0500  Creatinine, serum  (enoxaparin (LOVENOX)    CrCl >/= 30 ml/min)  Weekly,   R     Comments: while on enoxaparin therapy    05/24/22 1626   05/29/22 7673  Basic metabolic panel  Daily at 5am,   R      05/28/22 0813          Data Reviewed: I have personally reviewed following labs and imaging studies CBC: Recent Labs  Lab 05/24/22 0917 05/25/22 0534 05/27/22 0553 05/29/22 0458  WBC 9.1 4.6 4.5 5.8  HGB 16.2 14.8 13.6 13.1  HCT 45.9 44.0 40.8 39.1  MCV 89.0 92.8 94.9 95.1  PLT 326 307 249 419   Basic Metabolic Panel: Recent Labs  Lab 05/24/22 0917 05/25/22 0534 05/27/22 0553 05/27/22 0753 05/28/22 0528 05/28/22 1349 05/29/22 0458  NA 138 140 144  --  140  --  142  K 3.7 3.7 3.1*  --  2.9* 3.3* 3.5  CL 100 107 107  --  105  --  109  CO2 '25 27 30  '$ --  29  --  27  GLUCOSE 161* 136* 134*  --  121*  --  106*  BUN 23 23 33*  --  25*  --  22  CREATININE 1.09 1.00 0.94  --  0.84  --  0.91  CALCIUM 10.2 9.1 8.3*  --  8.1*  --  8.2*  MG  --   --   --  2.3  --   --  2.2   GFR: Estimated Creatinine Clearance: 83.1 mL/min (by C-G formula based on SCr of 0.91 mg/dL). Liver Function Tests: Recent Labs  Lab 05/24/22 0917 05/25/22 0534  AST 14* 15  ALT 17 15  ALKPHOS 70 54  BILITOT 1.5* 1.1  PROT 7.8 6.3*  ALBUMIN 4.8 3.7   Recent Labs  Lab 05/24/22 0917  LIPASE <10*  Sepsis Labs: Recent Labs  Lab 05/24/22 1119 05/24/22 1649 05/24/22 1920 05/25/22 0534  LATICACIDVEN 2.5* 1.5 1.5 1.4   Radiology Studies: DG Abd 1 View  Result Date: 05/27/2022 CLINICAL DATA:  Small-bowel obstruction  EXAM: ABDOMEN - 1 VIEW COMPARISON:  Previous studies including the examination of 05/26/2022 FINDINGS: No significant changes are noted in small bowel dilation. Small bowel loops measured up to 5.6 cm in diameter. Colon is not distended. Tip of enteric tube is seen in proximal duodenum. IMPRESSION: No significant interval changes are noted in small bowel dilation suggesting partial small bowel obstruction. Tip of enteric tube is seen in the region of proximal duodenum. Electronically Signed   By: Elmer Picker M.D.   On: 05/27/2022 10:22     LOS: 5 days   Antonieta Pert, MD  [facility]  05/29/2022, 8:01 AM

## 2022-05-29 NOTE — Anesthesia Postprocedure Evaluation (Signed)
Anesthesia Post Note  Patient: Troy [name]  Procedure(s) Performed: laparoscopic assisted small bowel resection with anastamosis (Abdomen)     Patient location during evaluation: PACU Anesthesia Type: General Level of consciousness: awake and alert Pain management: pain level controlled Vital Signs Assessment: post-procedure vital signs reviewed and stable Respiratory status: spontaneous breathing, nonlabored ventilation, respiratory function stable and patient connected to nasal cannula oxygen Cardiovascular status: blood pressure returned to baseline and stable Postop Assessment: no apparent nausea or vomiting Anesthetic complications: no   No notable events documented.  Last Vitals:  Vitals:   05/29/22 1630 05/29/22 1653  BP: 128/63 (!) 156/81  Pulse: 75 76  Resp: 15   Temp: 36.8 C (!) 36.4 C  SpO2: 94% 90%    Last Pain:  Vitals:   05/29/22 1653  TempSrc: Oral  PainSc:                  Audry Pili

## 2022-05-29 NOTE — Progress Notes (Signed)
Patient called RN to room, NG tube has been dislodged, patient states that he does not want NG replaced because he is going for laparotomy at some point today. On call physician made aware. Patient has been NPO since midnight. He reports that he will notify nursing staff of any nausea, vomiting, unbearable bloating or distention. Patient verbalizes that all acute needs have been met at this time.  

## 2022-05-29 NOTE — Care Management Important Message (Signed)
Important Message  Patient Details IM Letter given to the Patient. Name: Troy [name] MRN: [id number] Date of Birth: [birth year]   Medicare Important Message Given:  Yes     Kerin Salen 05/29/2022, 9:35 AM

## 2022-05-29 NOTE — Transfer of Care (Signed)
Immediate Anesthesia Transfer of Care Note  Patient: NAIM MURTHA  Procedure(s) Performed: laparoscopic assisted small bowel resection with anastamosis (Abdomen)  Patient Location: PACU  Anesthesia Type:General  Level of Consciousness: sedated  Airway & Oxygen Therapy: Patient Spontanous Breathing and Patient connected to face mask oxygen  Post-op Assessment: Report given to RN and Post -op Vital signs reviewed and stable  Post vital signs: Reviewed and stable  Last Vitals:  Vitals Value Taken Time  BP 169/83 05/29/22 1552  Temp    Pulse 79 05/29/22 1553  Resp 14 05/29/22 1553  SpO2 99 % 05/29/22 1553  Vitals shown include unvalidated device data.  Last Pain:  Vitals:   05/29/22 1311  TempSrc: Oral  PainSc:       Patients Stated Pain Goal: 2 (62/03/55 9741)  Complications: No notable events documented.

## 2022-05-29 NOTE — Progress Notes (Signed)
Initial Nutrition Assessment  INTERVENTION:   Monitor magnesium, potassium, and phosphorus BID for at least 3 days, MD to replete as needed, as pt is at risk for refeeding syndrome.  -TPN management per Pharmacy  -Will monitor for diet advancement  NUTRITION DIAGNOSIS:   Increased nutrient needs related to acute illness as evidenced by estimated needs.  GOAL:   Patient will meet greater than or equal to 90% of their needs  MONITOR:   PO intake, Supplement acceptance, Labs, Weight trends, I & O's  REASON FOR ASSESSMENT:   Consult New TPN/TNA  ASSESSMENT:   76 y.o. male with medical history significant of prostate CA, HLD, HTN. Presenting with nausea/vomiting and abdominal pain. CT abd and pelvis High-grade Small-bowel Obstruction.  Patient in room with family at bedside. Pt reports he last ate a few rice chex on 9/12 and that was the last time he ate anything. He only consumed some saltines on 9/11. Prior to then he was eating normally, 2.5 meals a day.  Has been NPO or on clear liquids since admission on 9/13. Pt's NGT is out today, became dislodged overnight.  NPO, awaiting surgery today then PICC placement.  Per weight records, no significant weight change recently.   TPN to begin today at 40 ml/hr, providing 969 kcals and 48g protein.  Medications: KCl  Labs reviewed.   NUTRITION - FOCUSED PHYSICAL EXAM:  No depletions noted.  Diet Order:   Diet Order             Diet NPO time specified  Diet effective midnight                   EDUCATION NEEDS:   Education needs have been addressed  Skin:  Skin Assessment: Reviewed RN Assessment  Last BM:  9/18  Height:   Ht Readings from Last 1 Encounters:  05/24/22 '5\' 9"'$  (1.753 m)    Weight:   Wt Readings from Last 1 Encounters:  05/24/22 106.6 kg    BMI:  Body mass index is 34.7 kg/m.  Estimated Nutritional Needs:   Kcal:  1850-2050  Protein:  90-100g  Fluid:  2L/day  Clayton Bibles,  MS, RD, LDN Inpatient Clinical Dietitian Contact information available via Amion

## 2022-05-29 NOTE — Progress Notes (Addendum)
Day of Surgery   Subjective/Chief Complaint: NG tube fell out overnight. He denies nausea currently. Had a small BM this AM.   Patient failed clamping trial Friday and Saturday. He has been having small amts flatus and even some BMs Friday, Saturday, and sunday. But did have persistently high NG output Friday/saturday as well as some distention/pain with NG clamped. KUB yesterday with persistently dilated loops of small bowel, obstructive pattern   Objective: Vital signs in last 24 hours: Temp:  [97.8 F (36.6 C)-98.3 F (36.8 C)] 97.8 F (36.6 C) (09/18 0646) Pulse Rate:  [72-87] 72 (09/18 0646) Resp:  [18-20] 18 (09/18 0646) BP: (128-147)/(75-97) 135/75 (09/18 0646) SpO2:  [94 %-98 %] 94 % (09/18 0646) Last BM Date : 05/27/22  Intake/Output from previous day: 09/17 0701 - 09/18 0700 In: 1949.9 [P.O.:120; I.V.:1488.7; NG/GT:90; IV Piggyback:251.1] Out: 1050 [Urine:450; Emesis/NG output:600] Intake/Output this shift: No intake/output data recorded.   Gen:  Alert, NAD, pleasant Card:  Regular rate and rhythm Pulm:  Normal effort Abd: Soft, mild distention, nontender, NG with bilious effluent - Skin: warm and dry, no rashes  Psych: A&Ox3  Lab Results:  Recent Labs    05/27/22 0553 05/29/22 0458  WBC 4.5 5.8  HGB 13.6 13.1  HCT 40.8 39.1  PLT 249 267   BMET Recent Labs    05/28/22 0528 05/28/22 1349 05/29/22 0458  NA 140  --  142  K 2.9* 3.3* 3.5  CL 105  --  109  CO2 29  --  27  GLUCOSE 121*  --  106*  BUN 25*  --  22  CREATININE 0.84  --  0.91  CALCIUM 8.1*  --  8.2*   PT/INR No results for input(s): "LABPROT", "INR" in the last 72 hours. ABG No results for input(s): "PHART", "HCO3" in the last 72 hours.  Invalid input(s): "PCO2", "PO2"  Studies/Results: [place] EKG SITE RITE  Result Date: 05/29/2022 If Site Rite image not attached, placement could not be confirmed due to current cardiac rhythm.   Anti-infectives: Anti-infectives (From admission,  onward)    Start     Dose/Rate Route Frequency Ordered Stop   05/29/22 0930  ceFAZolin (ANCEF) IVPB 2g/100 mL premix        2 g 200 mL/hr over 30 Minutes Intravenous On call to O.R. 05/28/22 2015 05/30/22 0559       Assessment/Plan:  Small bowel obstruction, suspect related to intra-abdominal adhesions - Afebrile, vital stable, no leukocytosis - CT scan of the abdomen and pelvis shows a transition point in the mid abdomen, radiologist raises concern for possible internal hernia   Clinically, the patient does not have signs or symptoms of bowel ischemia-his heart rate and vitals are normal, he has no leukocytosis, his abdomen is minimally tender on exam without rebound tenderness.  His lactate has normalized. - SBO protocol 9/13 >> no contrast in colon. Failed clamp trial twice. But is having some bowel function. KUB yesterday with obstructive bowel gas pattern. Clinically he has a high grade partial SBO. Dr. Brantley Stage spoke to the patient at length about this yesterday, please see his note. I do not think his symptoms will resolve without surgery. Recommend diagnostic laparoscopy, possible laparotomy, possible bowel resection, all other indicated procedures today by Dr. Kieth Brightly. Will need NG replaced before OR if he develops severe distention, pain, nausea.      FEN -n.p.o. VTE - SCD's, ok for DVT ppx from [facility] standpoint ID -none indicated Admit -TRH service  HTN HLD PMH Prostate CA Incidental finding of a small bladder mass and small renal mass on CT, will need outpatient follow-up with urology        I reviewed nursing notes, hospitalist notes, last 24 h vitals and pain scores, last 48 h intake and output, last 24 h labs and trends, and last 24 h imaging results.   LOS: 5 days    Jill Alexanders MD  05/29/2022    Total time 30 minutes discussing surgery, postop recovery, complications, face-to-face time, documentation, and review of vital signs, labs, imaging studies

## 2022-05-29 NOTE — Anesthesia Procedure Notes (Signed)
Procedure Name: Intubation Date/Time: 05/29/2022 2:36 PM  Performed by: Lind Covert, CRNAPre-anesthesia Checklist: Patient identified, Emergency Drugs available, Suction available, Patient being monitored and Timeout performed Patient Re-evaluated:Patient Re-evaluated prior to induction Oxygen Delivery Method: Circle system utilized Preoxygenation: Pre-oxygenation with 100% oxygen Induction Type: IV induction Ventilation: Mask ventilation without difficulty Laryngoscope Size: Mac and 3 Grade View: Grade I Tube type: Oral Tube size: 7.5 mm Number of attempts: 1 Airway Equipment and Method: Stylet Placement Confirmation: ETT inserted through vocal cords under direct vision, positive ETCO2 and breath sounds checked- equal and bilateral Secured at: 23 cm Tube secured with: Tape Dental Injury: Teeth and Oropharynx as per pre-operative assessment

## 2022-05-29 NOTE — Progress Notes (Signed)
Patient has been transported via wheelchair to Short Stay. Family at bedside and aware.

## 2022-05-29 NOTE — Progress Notes (Signed)
Peripherally Inserted Central Catheter Placement  The IV Nurse has discussed with the patient and/or persons authorized to consent for the patient, the purpose of this procedure and the potential benefits and risks involved with this procedure.  The benefits include less needle sticks, lab draws from the catheter, and the patient may be discharged home with the catheter. Risks include, but not limited to, infection, bleeding, blood clot (thrombus formation), and puncture of an artery; nerve damage and irregular heartbeat and possibility to perform a PICC exchange if needed/ordered by physician.  Alternatives to this procedure were also discussed.  Bard Power PICC patient education guide, fact sheet on infection prevention and patient information card has been provided to patient /or left at bedside.    PICC Placement Documentation  PICC Double Lumen 67/73/73 Right Basilic 40 cm 0 cm (Active)  Indication for Insertion or Continuance of Line Administration of hyperosmolar/irritating solutions (i.e. TPN, Vancomycin, etc.) 05/29/22 1754  Exposed Catheter (cm) 0 cm 05/29/22 1754  Site Assessment Clean, Dry, Intact 05/29/22 1754  Lumen #1 Status Flushed;Saline locked;Blood return noted 05/29/22 1754  Lumen #2 Status Flushed;Saline locked;Blood return noted 05/29/22 1754  Dressing Type Transparent;Securing device 05/29/22 1754  Dressing Status Antimicrobial disc in place 05/29/22 1754  Dressing Intervention New dressing;Other (Comment) 05/29/22 1754  Dressing Change Due 06/05/22 05/29/22 1754   Telephone consent by patient    Christella Noa Albarece 05/29/2022, 5:55 PM

## 2022-05-30 ENCOUNTER — Encounter (HOSPITAL_COMMUNITY): Payer: Self-pay | Admitting: General Surgery

## 2022-05-30 DIAGNOSIS — K56609 Unspecified intestinal obstruction, unspecified as to partial versus complete obstruction: Secondary | ICD-10-CM | POA: Diagnosis not present

## 2022-05-30 LAB — COMPREHENSIVE METABOLIC PANEL
ALT: 17 U/L (ref 0–44)
AST: 15 U/L (ref 15–41)
Albumin: 2.7 g/dL — ABNORMAL LOW (ref 3.5–5.0)
Alkaline Phosphatase: 47 U/L (ref 38–126)
Anion gap: 4 — ABNORMAL LOW (ref 5–15)
BUN: 21 mg/dL (ref 8–23)
CO2: 26 mmol/L (ref 22–32)
Calcium: 7.8 mg/dL — ABNORMAL LOW (ref 8.9–10.3)
Chloride: 107 mmol/L (ref 98–111)
Creatinine, Ser: 0.68 mg/dL (ref 0.61–1.24)
GFR, Estimated: >60 mL/min (ref >60)
Glucose, Bld: 167 mg/dL — ABNORMAL HIGH (ref 70–99)
Potassium: 4 mmol/L (ref 3.5–5.1)
Sodium: 137 mmol/L (ref 135–145)
Total Bilirubin: 0.6 mg/dL (ref 0.3–1.2)
Total Protein: 5.5 g/dL — ABNORMAL LOW (ref 6.5–8.1)

## 2022-05-30 LAB — CBC
HCT: 35.6 % — ABNORMAL LOW (ref 39.0–52.0)
Hemoglobin: 12 g/dL — ABNORMAL LOW (ref 13.0–17.0)
MCH: 31.7 pg (ref 26.0–34.0)
MCHC: 33.7 g/dL (ref 30.0–36.0)
MCV: 94.2 fL (ref 80.0–100.0)
Platelets: 284 10*3/uL (ref 150–400)
RBC: 3.78 MIL/uL — ABNORMAL LOW (ref 4.22–5.81)
RDW: 12.1 % (ref 11.5–15.5)
WBC: 8.1 10*3/uL (ref 4.0–10.5)
nRBC: 0 % (ref 0.0–0.2)

## 2022-05-30 LAB — GLUCOSE, CAPILLARY
Glucose-Capillary: 142 mg/dL — ABNORMAL HIGH (ref 70–99)
Glucose-Capillary: 106 mg/dL — ABNORMAL HIGH (ref 70–99)
Glucose-Capillary: 114 mg/dL — ABNORMAL HIGH (ref 70–99)

## 2022-05-30 LAB — TRIGLYCERIDES: Triglycerides: 78 mg/dL (ref <150)

## 2022-05-30 LAB — PHOSPHORUS: Phosphorus: 3.2 mg/dL (ref 2.5–4.6)

## 2022-05-30 LAB — MAGNESIUM: Magnesium: 2 mg/dL (ref 1.7–2.4)

## 2022-05-30 MED ORDER — SODIUM CHLORIDE 0.9 % IV SOLN: INTRAVENOUS | Status: DC

## 2022-05-30 MED ORDER — TRAVASOL 10 % IV SOLN
INTRAVENOUS | Status: AC
  Filled 2022-05-30: qty 720

## 2022-05-30 NOTE — Progress Notes (Signed)
1 Day Post-Op   Subjective/Chief Complaint: Pain controlled. Tolerating sips of clears. Denies bowel function yet. Wife and daughter at bedside.    Objective: Vital signs in last 24 hours: Temp:  [97.5 F (36.4 C)-98.6 F (37 C)] 97.6 F (36.4 C) (09/19 0536) Pulse Rate:  [58-108] 58 (09/19 0536) Resp:  [15-20] 18 (09/19 0536) BP: (128-169)/(63-88) 139/74 (09/19 0536) SpO2:  [90 %-99 %] 99 % (09/19 0536) Weight:  [106.6 kg] 106.6 kg (09/18 1252) Last BM Date : 05/29/22  Intake/Output from previous day: 09/18 0701 - 09/19 0700 In: 3371.1 [P.O.:480; I.V.:2691.1; IV Piggyback:200] Out: 1075 [Urine:1075] Intake/Output this shift: No intake/output data recorded.   Gen:  Alert, NAD, pleasant Card:  Regular rate and rhythm Pulm:  Normal effort Abd: Soft, mild distention, nontender, incisions c/d/I - scant SS strikethrough GU: foley in place drainage dark yellow urine, no gross blood Skin: warm and dry, no rashes  Psych: A&Ox3   Lab Results:  Recent Labs    05/29/22 0458 05/30/22 0251  WBC 5.8 8.1  HGB 13.1 12.0*  HCT 39.1 35.6*  PLT 267 284   BMET Recent Labs    05/29/22 0458 05/29/22 1800 05/30/22 0251  NA 142  --  137  K 3.5 3.9 4.0  CL 109  --  107  CO2 27  --  26  GLUCOSE 106*  --  167*  BUN 22  --  21  CREATININE 0.91  --  0.68  CALCIUM 8.2*  --  7.8*   PT/INR No results for input(s): "LABPROT", "INR" in the last 72 hours. ABG No results for input(s): "PHART", "HCO3" in the last 72 hours.  Invalid input(s): "PCO2", "PO2"  Studies/Results: [place] EKG SITE RITE  Result Date: 05/29/2022 If Site Rite image not attached, placement could not be confirmed due to current cardiac rhythm.   Anti-infectives: Anti-infectives (From admission, onward)    Start     Dose/Rate Route Frequency Ordered Stop   05/29/22 0930  ceFAZolin (ANCEF) IVPB 2g/100 mL premix        2 g 200 mL/hr over 30 Minutes Intravenous On call to O.R. 05/28/22 2015 05/29/22 1437        Assessment/Plan:  Small bowel obstruction  POD#1 s/p diagnostic laparoscopy, small bowel resection with anastomosis 05/29/22 Dr. 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  - AFVSS, hgb 12.0 from 13.1, WBC WNL - allow CLD and await return of bowel function - IS/mobilize - PRN analgesics/antiemetics     FEN - CLD VTE - SCD's, Lovenox ID -none indicated Foley- remove today, POD#1 Admit -TRH service     HTN HLD PMH Prostate CA Incidental finding of a small bladder mass and small renal mass on CT, will need outpatient follow-up with urology (Dr. Alinda Money)        I reviewed nursing notes, hospitalist notes, last 24 h vitals and pain scores, last 48 h intake and output, last 24 h labs and trends, and last 24 h imaging results.   LOS: 6 days    Jill Alexanders MD  05/30/2022    Total time 30 minutes discussing surgery, postop recovery, complications, face-to-face time, documentation, and review of vital signs, labs, imaging studies

## 2022-05-30 NOTE — Progress Notes (Signed)
Mobility Specialist - Progress Note   05/30/22 1112  Mobility  HOB Elevated/Bed Position Self regulated  Activity Ambulated with assistance in room  Range of Motion/Exercises Active  Level of Assistance Standby assist, set-up cues, supervision of patient - no hands on  Assistive Device Front wheel walker  Distance Ambulated (ft) 500 ft  Activity Response Tolerated well  Transport method Ambulatory  $Mobility charge 1 Mobility   Pt received in bed and agreeable to mobility. Wife mentioned his face getting red & felt like he was having a reaction to his IV medication. Pt stated he feels fine & vitals were checked. Nurse was notified. Pt left in recliner w/ all necessities in reach.    Post-mobility:  61bpm HR, 137/77 BP, 98% SPO2   Set designer

## 2022-05-30 NOTE — Plan of Care (Signed)

## 2022-05-30 NOTE — Progress Notes (Signed)
PROGRESS NOTE Troy [name]  AUQ:[id number] DOB: 1946-02-13 DOA: 05/24/2022 PCP: Derinda Late, MD   Brief Narrative/Hospital Course: 76 y.o. male with medical history significant of prostate CA, HLD, HTN. Presenting with nausea/vomiting and abdominal pain. CT abd and pelvis High-grade Small-bowel Obstruction. Scattered free fluid in the abdomen and pelvis. Abrupt transition point in the mid abdomen on series 2, image 56. Main differential considerations are obstruction due to adhesion or internal hernia. He was admitted for SBO and General surgery was consulted-being managed conservatively with NGT decompression.  NG tube clamped however patient did not tolerate had worsening distention pain, and will improved after NG tube suction.  Surgery has reevaluated and underwent  OR- with  small bowel diverticular disease and s/p laparoscopic small bowel resection with anastomosis  0/18 and started on TPN    Subjective:  Seen and examined this morning.  Multiple family at the bedside.  Patient appears much improved in terms of pain no nausea vomiting.   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 On TPN now, on clear liquid diet overall appears stable.  Awaiting for return of bowel function.  Discussed with surgery team encourage ambulation continue DVT prophylaxis pain control as per surgery team.   Hypokalemia:resolved  New bladder and right renal mass: He has been recommended outpatient follow-up with [facility] urology.  Foley catheter having blood-tinged  urine monitor hemoglobin/urine  HTN/HLD:BP is well controlled.At home on HCTZ/losartan/metoprolol and Crestor.cont metoprolol/crestor once able to take orally.    Anxiety: Stable on  prn xanax  Lactic acidosis on admission -resolved  Class I Obesity:Patient's Body mass index is 34.7 kg/m.Will benefit with PCP follow-up, weight loss  healthy lifestyle and outpatient sleep evaluation.  DVT prophylaxis: enoxaparin (LOVENOX) injection 40 mg Start: 05/24/22 2200 Code Status:   Code Status: Full Code Family Communication: plan of care discussed with patient/family and wife at bedside.  Discussed surgery team in the room  Patient status is: Inpatient because of SBO Level of care: Med-Surg  Dispo:The patient is from: Home           Anticipated disposition: home once he has return of bowel function Objective: Vitals last 24 hrs: Vitals:   05/29/22 2039 05/29/22 2125 05/30/22 0136 05/30/22 0536  BP: 137/77 (!) 140/78 130/75 139/74  Pulse: (!) 108 72 (!) 58 (!) 58  Resp: '18 18 20 18  '$ Temp: 98.4 F (36.9 C) 98.6 F (37 C) (!) 97.5 F (36.4 C) 97.6 F (36.4 C)  TempSrc: Oral Oral Oral Oral  SpO2: 96% 97% 96% 99%  Weight:      Height:       Weight change:   Physical Examination: General exam: AAox3, older than stated age, weak appearing. HEENT:Oral mucosa moist, Ear/Nose WNL grossly, dentition normal. Respiratory system: bilaterally diminished, no use of accessory muscle Cardiovascular system: S1 & S2 +, No JVD,. Gastrointestinal system: Abdomen soft, mildly bloated, bowel sound present/sluggish, mild tenderness.   Nervous System:Alert, awake, moving extremities and grossly nonfocal Extremities: LE ankle edema neg, distal peripheral pulses palpable.  Skin: No rashes,no icterus. MSK:  Normal muscle bulk,tone, power  Dark urine w/ blood tinge  Medications reviewed:  Scheduled Meds:  Chlorhexidine Gluconate Cloth  6 each Topical Daily   enoxaparin (LOVENOX) injection  40 mg  Subcutaneous Q24H   insulin aspart  0-9 Units Subcutaneous Q8H   metoprolol succinate  25 mg Oral Daily   rosuvastatin  20 mg Oral QHS   Continuous Infusions:  sodium chloride 35 mL/hr at 05/29/22 1830   sodium chloride     TPN ADULT (ION) 40 mL/hr at 05/29/22 1837   TPN ADULT (ION)      Diet Order             Diet clear liquid Room service appropriate? Yes; Fluid consistency: Thin  Diet effective now                  Unresulted Labs (From admission, onward)     Start     Ordered   06/01/22 0500  Comprehensive metabolic panel  (TPN Lab Panel)  Every Mon,Thu (0500),   R      05/29/22 0905   06/01/22 0500  Magnesium  (TPN Lab Panel)  Every Mon,Thu (0500),   R      05/29/22 0905   06/01/22 0500  Phosphorus  (TPN Lab Panel)  Every Mon,Thu (0500),   R      05/29/22 0905   06/01/22 0500  Triglycerides  (TPN Lab Panel)  Every Mon,Thu (0500),   R      05/29/22 0905   05/31/22 0500  Creatinine, serum  (enoxaparin (LOVENOX)    CrCl >/= 30 ml/min)  Weekly,   R     Comments: while on enoxaparin therapy    05/24/22 1626   05/31/22 0500  Comprehensive metabolic panel  Tomorrow morning,   R       Question:  Specimen collection method  Answer:  IV Team=IV Team collect   05/30/22 0723   05/31/22 0500  Magnesium  Tomorrow morning,   R       Question:  Specimen collection method  Answer:  IV Team=IV Team collect   05/30/22 0723   05/31/22 0500  Phosphorus  Tomorrow morning,   R       Question:  Specimen collection method  Answer:  IV Team=IV Team collect   05/30/22 0723   05/29/22 2671  Basic metabolic panel  Daily at 5am,   R      05/28/22 0813          Data Reviewed: I have personally reviewed following labs and imaging studies CBC: Recent Labs  Lab 05/24/22 0917 05/25/22 0534 05/27/22 0553 05/29/22 0458 05/30/22 0251  WBC 9.1 4.6 4.5 5.8 8.1  HGB 16.2 14.8 13.6 13.1 12.0*  HCT 45.9 44.0 40.8 39.1 35.6*  MCV 89.0 92.8 94.9 95.1 94.2  PLT 326 307 249 267 245   Basic  Metabolic Panel: Recent Labs  Lab 05/25/22 0534 05/27/22 0553 05/27/22 0753 05/28/22 0528 05/28/22 1349 05/29/22 0458 05/29/22 1800 05/30/22 0251  NA 140 144  --  140  --  142  --  137  K 3.7 3.1*  --  2.9* 3.3* 3.5 3.9 4.0  CL 107 107  --  105  --  109  --  107  CO2 27 30  --  29  --  27  --  26  GLUCOSE 136* 134*  --  121*  --  106*  --  167*  BUN 23 33*  --  25*  --  22  --  21  CREATININE 1.00 0.94  --  0.84  --  0.91  --  0.68  CALCIUM 9.1 8.3*  --  8.1*  --  8.2*  --  7.8*  MG  --   --  2.3  --   --  2.2  --  2.0  PHOS  --   --   --   --   --  3.4  --  3.2   GFR: Estimated Creatinine Clearance: 94.6 mL/min (by C-G formula based on SCr of 0.68 mg/dL). Liver Function Tests: Recent Labs  Lab 05/24/22 0917 05/25/22 0534 05/30/22 0251  AST 14* 15 15  ALT '17 15 17  '$ ALKPHOS 70 54 47  BILITOT 1.5* 1.1 0.6  PROT 7.8 6.3* 5.5*  ALBUMIN 4.8 3.7 2.7*   Recent Labs  Lab 05/24/22 0917  LIPASE <10*  Sepsis Labs: Recent Labs  Lab 05/24/22 1119 05/24/22 1649 05/24/22 1920 05/25/22 0534  LATICACIDVEN 2.5* 1.5 1.5 1.4   Radiology Studies: [place] EKG SITE RITE  Result Date: 05/29/2022 If Site Rite image not attached, placement could not be confirmed due to current cardiac rhythm.    LOS: 6 days   Antonieta Pert, MD [facility]  05/30/2022, 10:06 AM

## 2022-05-30 NOTE — Plan of Care (Signed)
  Problem: Coping: Goal: Level of anxiety will decrease Outcome: Progressing   Problem: Elimination: Goal: Will not experience complications related to bowel motility Outcome: Progressing Goal: Will not experience complications related to urinary retention Outcome: Progressing   Problem: Pain Managment: Goal: General experience of comfort will improve Outcome: Progressing   

## 2022-05-30 NOTE — Progress Notes (Signed)
PHARMACY - TOTAL PARENTERAL NUTRITION CONSULT NOTE   Indication: Prolonged ileus anticipated  Patient Measurements: Height: '5\' 9"'$  (175.3 cm) Weight: 106.6 kg (235 lb) IBW/kg (Calculated) : 70.7 TPN AdjBW (KG): 79.7 Body mass index is 34.7 kg/m. Usual Weight:   Assessment:  76 y.o. male with hx of prostate CA, HLD, HTN who presents with nausea/vomiting and abdominal pain. CT abd and pelvis high-grade SBO, scattered free fluid in the abdomen and pelvis.  CCS consulted and managing conservatively with NGT, but patient did not tolerate clamping and now planning laparotomy 9/18.  Glucose / Insulin: No hx DM - CBGs 142-188 since starting TPN, 1 unit SSI required Electrolytes: K+ WNL after IV replacement 9/16, 9/17, 9/18 Renal: SCr < 1 Hepatic: WNL  Triglycerides: WNL (78 on 9/19) Intake / Output: +2296, NG out, LBM 9/16, UOP 1075cc now w/ foley MIVF: NS at 35 ml/hr GI Imaging: - 9/13 CT: High-grade Small-bowel Obstruction. Scattered free fluid in the abdomen and pelvis. - 9/16 KUB shows no significant change in bowel gas pattern with persistent dilated small bowel. GI Surgeries / Procedures:  - 9/18: laparoscopic small bowel resection with anastomosis  Central access: plan PICC 9/18 TPN start date: plan 9/18  Nutritional Goals: Goal TPN rate is 80 mL/hr (provides 99 g of protein and 1954 kcals per day)  RD Assessment: pending Estimated Needs Total Energy Estimated Needs: 1850-2050 Total Protein Estimated Needs: 90-100g Total Fluid Estimated Needs: 2L/day  Current Nutrition:  NPO TPN at 40 ml/hr  Plan:   Increase TPN to 48m/hr at 1800 This will provide 72g protein, 1454kcal Electrolytes in TPN:  Na 582m/L,  K 50104mL (decrease),  Ca 5mE56m,  Mg 5mEq49m  Phos 15mmo71m  Cl:Ac 1:1 Add standard MVI and trace elements to TPN Continue Sensitive q8h SSI and adjust as needed  Reduce MIVF to KVO at 1800  Monitor TPN labs on Mon/Thurs, CMET/Mg/Phos in AM  Eddye Broxterman  Peggyann JubamD, BCPS [id number] [phone],7:12 AM

## 2022-05-30 NOTE — Progress Notes (Signed)
Patient slept well, denies the need for pain medication at this time, foley continues to have blood tinged output with some small clots, on-call provider Lorra Hals, NP) made aware via secure chat. Patient has newly found bladder/renal mass and a history of urologic surgeries. VSS, will continue to monitor.

## 2022-05-31 DIAGNOSIS — K56609 Unspecified intestinal obstruction, unspecified as to partial versus complete obstruction: Secondary | ICD-10-CM | POA: Diagnosis not present

## 2022-05-31 LAB — COMPREHENSIVE METABOLIC PANEL
ALT: 22 U/L (ref 0–44)
AST: 23 U/L (ref 15–41)
Albumin: 2.8 g/dL — ABNORMAL LOW (ref 3.5–5.0)
Alkaline Phosphatase: 47 U/L (ref 38–126)
Anion gap: 5 (ref 5–15)
BUN: 19 mg/dL (ref 8–23)
CO2: 25 mmol/L (ref 22–32)
Calcium: 8 mg/dL — ABNORMAL LOW (ref 8.9–10.3)
Chloride: 109 mmol/L (ref 98–111)
Creatinine, Ser: 0.75 mg/dL (ref 0.61–1.24)
GFR, Estimated: >60 mL/min (ref >60)
Glucose, Bld: 129 mg/dL — ABNORMAL HIGH (ref 70–99)
Potassium: 3.6 mmol/L (ref 3.5–5.1)
Sodium: 139 mmol/L (ref 135–145)
Total Bilirubin: 0.7 mg/dL (ref 0.3–1.2)
Total Protein: 5.5 g/dL — ABNORMAL LOW (ref 6.5–8.1)

## 2022-05-31 LAB — GLUCOSE, CAPILLARY
Glucose-Capillary: 129 mg/dL — ABNORMAL HIGH (ref 70–99)
Glucose-Capillary: 110 mg/dL — ABNORMAL HIGH (ref 70–99)
Glucose-Capillary: 108 mg/dL — ABNORMAL HIGH (ref 70–99)

## 2022-05-31 LAB — SURGICAL PATHOLOGY

## 2022-05-31 LAB — MAGNESIUM: Magnesium: 2 mg/dL (ref 1.7–2.4)

## 2022-05-31 LAB — PHOSPHORUS: Phosphorus: 3.1 mg/dL (ref 2.5–4.6)

## 2022-05-31 MED ORDER — POTASSIUM CHLORIDE 10 MEQ/100ML IV SOLN
10.0000 meq | INTRAVENOUS | Status: AC
  Administered 2022-05-31 (×3): 10 meq via INTRAVENOUS
  Filled 2022-05-31 (×3): qty 100

## 2022-05-31 MED ORDER — TRAVASOL 10 % IV SOLN
INTRAVENOUS | Status: DC
  Filled 2022-05-31: qty 960

## 2022-05-31 NOTE — Discharge Summary (Signed)
Physician Discharge Summary  Troy [name] GMW:[id number] DOB: July 26, 1946 DOA: 05/24/2022  PCP: Derinda Late, MD  Admit date: 05/24/2022 Discharge date: 05/31/2022 Recommendations for Outpatient Follow-up:  Follow up with PCP in 1 weeks-call for appointment Please obtain BMP/CBC in one week  Discharge Dispo: home Discharge Condition: Stable Code Status:   Code Status: Full Code Diet recommendation:  Diet Order             DIET SOFT Room service appropriate? Yes; Fluid consistency: Thin  Diet effective now                    Brief/Interim Summary: 76 y.o. male with medical history significant of prostate CA, HLD, HTN. Presenting with nausea/vomiting and abdominal pain. CT abd and pelvis High-grade Small-bowel Obstruction. Scattered free fluid in the abdomen and pelvis. Abrupt transition point in the mid abdomen on series 2, image 56. Main differential considerations are obstruction due to adhesion or internal hernia. He was admitted for SBO and General surgery was consulted-being managed conservatively with NGT decompression.  NG tube clamped however patient did not tolerate had worsening distention pain, and will improved after NG tube suction.  Surgery has reevaluated and underwent  OR- with  small bowel diverticular disease and s/p laparoscopic small bowel resection with anastomosis  9/18 and started on TPN.Passing flatus subtly,no more pain and bloating improved.  Diet advance to soft diet and tolerating well.  Ambulating well, surgery has cleared the patient for discharge home.  Patient feels comfortable going home today.     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  Postop course improving continue I-S/mobilize, pain is controlled, surgery following advanced to soft diet-tolerated well,Stopping TPN today.  Surgery has cleared for discharge.  Patient feels comfortable going home today   Hypokalemia: Repleted  New bladder and right renal mass: He has been recommended outpatient follow-up with [facility] urology.  Foley catheter removed 9/19-voiding well.    HTN/HLD:BP is well controlled.At home on HCTZ/losartan/metoprolol and Crestor. Resume on dc.  Anxiety: Stable on  prn xanax  Lactic acidosis on admission -resolved  Class I Obesity:Patient's Body mass index is 34.7 kg/m.Will benefit with PCP follow-up, weight loss  healthy lifestyle and outpatient sleep evaluation.  Consults: Surgery Subjective: Alert awake oriented, resting comfortably.  No complaints  Discharge Exam: Vitals:   05/31/22 1019 05/31/22 1336  BP: (!) 150/85 134/74  Pulse: 62 62  Resp: 18 16  Temp: 97.7 F (36.5 C) 97.9 F (36.6 C)  SpO2: 97% 97%   General: Pt is alert, awake, not in acute distress Cardiovascular: RRR, S1/S2 +, no rubs, no gallops Respiratory: CTA bilaterally, no wheezing, no rhonchi Abdominal: Soft, NT, ND, bowel sounds + Extremities: no edema, no cyanosis  Discharge Instructions  Discharge Instructions     Discharge instructions   Complete by: As directed    Please call call MD or return to ER for similar or worsening recurring problem that brought you to hospital or if any fever,nausea/vomiting,abdominal pain, uncontrolled pain, chest pain,  shortness of breath or any other  alarming symptoms.  Please follow-up your doctor as instructed in a week time and call the office for appointment.  Please avoid alcohol, smoking, or any other illicit substance and maintain healthy habits including taking your regular  medications as prescribed.  You were cared for by a hospitalist during your hospital stay. If you have any questions about your discharge medications or the care you received while you were in the hospital after you are discharged, you can call the unit and ask to speak with the hospitalist on call if the hospitalist that took care of you is not available.  Once you are discharged, your primary care physician will handle any further medical issues. Please note that NO REFILLS for any discharge medications will be authorized once you are discharged, as it is imperative that you return to your primary care physician (or establish a relationship with a primary care physician if you do not have one) for your aftercare needs so that they can reassess your need for medications and monitor your lab values   Discharge wound care:   Complete by: As directed    Keep the area dry clean   Increase activity slowly   Complete by: As directed       Allergies as of 05/31/2022       Reactions   Ciprofloxacin Diarrhea, Nausea And Vomiting   Can take Levaquin        Medication List     TAKE these medications    ALPRAZolam 0.5 MG tablet Commonly known as: XANAX Take 0.5 mg by mouth at bedtime as needed for anxiety. For sleep.   ezetimibe 10 MG tablet Commonly known as: ZETIA Take 10 mg by mouth at bedtime.   hydrochlorothiazide 25 MG tablet Commonly known as: HYDRODIURIL Take 25 mg by mouth daily.   losartan 100 MG tablet Commonly known as: COZAAR Take 100 mg by mouth every morning.   LUTEIN PO Take by mouth.   metoprolol succinate 50 MG 24 hr tablet Commonly known as: TOPROL-XL Take 50 mg by mouth daily. Take with or immediately following a meal.   ondansetron 4 MG disintegrating tablet Commonly known as: ZOFRAN-ODT Take by mouth.   QC TUMERIC COMPLEX PO Take by mouth.   rosuvastatin 20 MG tablet Commonly known as: CRESTOR Take 20 mg by mouth at bedtime.   Vitamin D3 50 MCG  (2000 UT) Tabs Take 1 tablet by mouth every morning.               Discharge Care Instructions  (From admission, onward)           Start     Ordered   05/31/22 0000  Discharge wound care:       Comments: Keep the area dry clean   05/31/22 1352            Follow-up Information     Kinsinger, Arta Bruce, MD. Go on 06/14/2022.   Specialty: General Surgery Why: at 9:00 AM for post-operative follow up. please arrive 20-30 minutes early and bring photo ID/insurance information. Contact information: [address] [place] [address] [phone]         Raynelle Bring, MD. Schedule an appointment as soon as possible for a visit in 2 week(s).   Specialty: Urology Why: for follow up of bladder/kidney abdnormality on recent CT scan. Contact information: [facility] [address] [phone]  Allergies  Allergen Reactions   Ciprofloxacin Diarrhea and Nausea And Vomiting    Can take Levaquin    The results of significant diagnostics from this hospitalization (including imaging, microbiology, ancillary and laboratory) are listed below for reference.    Microbiology: Recent Results (from the past 240 hour(s))  Resp Panel by RT-PCR (Flu A&B, Covid) Anterior Nasal Swab     Status: None   Collection Time: 05/24/22 10:06 AM   Specimen: Anterior Nasal Swab  Result Value Ref Range Status   SARS Coronavirus 2 by RT PCR NEGATIVE NEGATIVE Final    Comment: (NOTE) SARS-CoV-2 target nucleic acids are NOT DETECTED.  The SARS-CoV-2 RNA is generally detectable in upper respiratory specimens during the acute phase of infection. The lowest concentration of SARS-CoV-2 viral copies this assay can detect is 138 copies/mL. A negative result does not preclude SARS-Cov-2 infection and should not be used as the sole basis for treatment or other patient management decisions. A negative result may occur with  improper specimen  collection/handling, submission of specimen other than nasopharyngeal swab, presence of viral mutation(s) within the areas targeted by this assay, and inadequate number of viral copies(<138 copies/mL). A negative result must be combined with clinical observations, patient history, and epidemiological information. The expected result is Negative.  Fact Sheet for Patients:  EntrepreneurPulse.com.au  Fact Sheet for Healthcare Providers:  IncredibleEmployment.be  This test is no t yet approved or cleared by the [place] FDA and  has been authorized for detection and/or diagnosis of SARS-CoV-2 by FDA under an Emergency Use Authorization (EUA). This EUA will remain  in effect (meaning this test can be used) for the duration of the COVID-19 declaration under Section 564(b)(1) of the Act, 21 U.S.C.section 360bbb-3(b)(1), unless the authorization is terminated  or revoked sooner.       Influenza A by PCR NEGATIVE NEGATIVE Final   Influenza B by PCR NEGATIVE NEGATIVE Final    Comment: (NOTE) The Xpert Xpress SARS-CoV-2/FLU/RSV plus assay is intended as an aid in the diagnosis of influenza from Nasopharyngeal swab specimens and should not be used as a sole basis for treatment. Nasal washings and aspirates are unacceptable for Xpert Xpress SARS-CoV-2/FLU/RSV testing.  Fact Sheet for Patients: EntrepreneurPulse.com.au  Fact Sheet for Healthcare Providers: IncredibleEmployment.be  This test is not yet approved or cleared by the [place] FDA and has been authorized for detection and/or diagnosis of SARS-CoV-2 by FDA under an Emergency Use Authorization (EUA). This EUA will remain in effect (meaning this test can be used) for the duration of the COVID-19 declaration under Section 564(b)(1) of the Act, 21 U.S.C. section 360bbb-3(b)(1), unless the authorization is terminated or revoked.  Performed at [facility], [address], [place], [place] [address]     Procedures/Studies: [place] EKG SITE RITE  Result Date: 05/29/2022 If [facility] image not attached, placement could not be confirmed due to current cardiac rhythm.  DG Abd 1 View  Result Date: 05/27/2022 CLINICAL DATA:  Small-bowel obstruction EXAM: ABDOMEN - 1 VIEW COMPARISON:  Previous studies including the examination of 05/26/2022 FINDINGS: No significant changes are noted in small bowel dilation. Small bowel loops measured up to 5.6 cm in diameter. Colon is not distended. Tip of enteric tube is seen in proximal duodenum. IMPRESSION: No significant interval changes are noted in small bowel dilation suggesting partial small bowel obstruction. Tip of enteric tube is seen in the region of proximal duodenum. Electronically Signed   By: Prudy Feeler.D.  On: 05/27/2022 10:22   DG Abd Portable 1V  Result Date: 05/26/2022 CLINICAL DATA:  Small-bowel obstruction. EXAM: PORTABLE ABDOMEN - 1 VIEW COMPARISON:  Abdominal radiograph 05/25/2022 FINDINGS: Enteric tube courses below diaphragm with the side hole terminating in the distal stomach and the tip terminating in the region of the pylorus/proximal duodenum. The enteric tube appears slightly retracted compared to 05/25/2022. There are persistently dilated loops of bowel in the central abdomen measuring up to 4.7 cm, which is not significantly changed compared to prior exam. There is no evidence of pneumatosis. No supine evidence of pneumoperitoneum. Pelvic phlebolith is visualized. There are multilevel degenerative changes in the lower lumbar spine. Visualized lung bases are unremarkable. IMPRESSION: 1. Redemonstrated dilated loops of small bowel in the central and left upper quadrant, not significantly changed compared to prior exam, compatible with known small bowel obstruction. 2. Compared to prior exam there is slight interval retraction of the enteric tube which still  terminates in the peripyloric region. Electronically Signed   By: Marin Roberts M.D.   On: 05/26/2022 08:05   DG Abd Portable 1V-Small Bowel Obstruction Protocol-initial, 8 hr delay  Result Date: 05/25/2022 CLINICAL DATA:  Small-bowel protocol, 8 hours post contrast. EXAM: PORTABLE ABDOMEN - 1 VIEW COMPARISON:  05/24/2022. FINDINGS: Distended loops of small bowel are noted in the abdomen measuring up to 4.7 cm in the left upper quadrant. An enteric tube is seen in the stomach. Small amount of contrast is present in the stomach. The contrast is not visualized beyond the stomach. Surgical clips are noted in the epigastric region. Excreted contrast is present in the urinary bladder. IMPRESSION: 1. Multiple loops of dilated small bowel in the left upper quadrant with increased distention from the prior exam, compatible with known small bowel obstruction. 2. Minimal contrast is noted in the stomach and is not visualized in the small bowel. Electronically Signed   By: Brett Fairy M.D.   On: 05/25/2022 03:00   DG Abd Portable 1 View  Result Date: 05/24/2022 CLINICAL DATA:  Enteric tube placement. EXAM: PORTABLE ABDOMEN - 1 VIEW COMPARISON:  CT abdomen pelvis from same day. Abdominal x-ray dated July 10, 2014. FINDINGS: Enteric tube above the diaphragm in the distal esophagus. Unchanged dilated small bowel loops in the visualized upper abdomen. IMPRESSION: 1. Enteric tube above the diaphragm in the distal esophagus. Recommend advancement. 2. Unchanged small bowel obstruction. Electronically Signed   By: Titus Dubin M.D.   On: 05/24/2022 11:32   CT ABDOMEN PELVIS W CONTRAST  Result Date: 05/24/2022 CLINICAL DATA:  76 year old male with left lower quadrant pain for 2 days. History of diverticulitis. EXAM: CT ABDOMEN AND PELVIS WITH CONTRAST TECHNIQUE: Multidetector CT imaging of the abdomen and pelvis was performed using the standard protocol following bolus administration of intravenous contrast.  RADIATION DOSE REDUCTION: This exam was performed according to the departmental dose-optimization program which includes automated exposure control, adjustment of the mA and/or kV according to patient size and/or use of iterative reconstruction technique. CONTRAST:  81m OMNIPAQUE IOHEXOL 300 MG/ML  SOLN COMPARISON:  CT Abdomen and Pelvis 06/19/2018. FINDINGS: Lower chest: Stable tortuous descending thoracic aorta. No pericardial or pleural effusion. Mild lung base atelectasis or scarring. Hepatobiliary: Trace perihepatic free fluid. Small chronic gallstones. No CT evidence of acute cholecystitis. No bile duct enlargement. Pancreas: Fatty atrophied. Spleen: Negative. Adrenals/Urinary Tract: Adrenal glands and left kidney are stable and negative. Right renal lower pole seemingly enhancing exophytic nodule measuring 12 mm on series 5, image 41. A  tiny low-density area was present here in 2019. Symmetric renal enhancement and contrast excretion. Decompressed bladder. However, there is an abnormal 1.8 cm lobulated bladder hyperdensity along the left posterior wall near the left UVJ. No distal ureteral dilatation. See series 2, image 84 and series 5, image 41. Stomach/Bowel: Mostly decompressed rectosigmoid colon. Extensive sigmoid diverticulosis. Extensive diverticulosis in the descending colon which is decompressed. Small volume of simple density free fluid in the left gutter. Decompressed transverse colon. Mild retained gas and stool in the right colon. Small volume of simple density fluid in the right pericolic gutter. Appendix appears to remain normal on coronal image 69. No large bowel inflammation. The terminal ileum is decompressed. There are multiple decompressed loops of small bowel in the anterior pelvis. Upstream small bowel obstruction with dilated air in fluid-filled loops up to 37 mm diameter beginning in the proximal jejunum and tracking distally. There is an abrupt transition in the mid abdomen on series  2, image 56. In the immediate upstream loops are among the most inflamed. Note sigmoid mesenteric congestion on series 2, image 48. small volume of free fluid in the affected small bowel mesentery. No pneumoperitoneum. Chronic postoperative changes to the gastroesophageal junction. Duodenum and ligament of Treitz are decompressed. Vascular/Lymphatic: Aortoiliac calcified atherosclerosis. Major arterial structures in the abdomen and pelvis are patent. No lymphadenopathy identified. Reproductive: Chronic right inguinal hernia now contains a small volume of free fluid in addition to mesenteric fat. But no herniated bowel loop. Other: Small volume of free fluid in the pelvis has simple fluid density. Musculoskeletal: Progression of lumbar spine degeneration since 2019. New vacuum disc and progressed endplate degeneration at L3-L4. No acute osseous abnormality identified. IMPRESSION: 1. High-grade Small-bowel Obstruction. Scattered free fluid in the abdomen and pelvis. Abrupt transition point in the mid abdomen on series 2, image 56. Main differential considerations are obstruction due to adhesion or internal hernia. Recommend Surgery consultation. No pneumoperitoneum. 2. Appearance suspicious for both a 1.8 cm left posterior Bladder Wall Mass (series 2, image 84), and a small new solid right renal lower pole mass (1.2 cm series 5, image 41). Consider Bladder and Renal Cell Carcinoma. Recommend follow-up with Urology. 3. A chronic right inguinal hernia now contains free fluid but there is no herniated bowel loop. 4. Chronic cholelithiasis without CT evidence of acute cholecystitis. Extensive diverticulosis of distal colon. Aortic Atherosclerosis (ICD10-I70.0). Electronically Signed   By: Genevie Ann M.D.   On: 05/24/2022 10:48    Labs: BNP (last 3 results) No results for input(s): "BNP" in the last 8760 hours. Basic Metabolic Panel: Recent Labs  Lab 05/27/22 0553 05/27/22 0753 05/28/22 0528 05/28/22 1349  05/29/22 0458 05/29/22 1800 05/30/22 0251 05/31/22 0433  NA 144  --  140  --  142  --  137 139  K 3.1*  --  2.9* 3.3* 3.5 3.9 4.0 3.6  CL 107  --  105  --  109  --  107 109  CO2 30  --  29  --  27  --  26 25  GLUCOSE 134*  --  121*  --  106*  --  167* 129*  BUN 33*  --  25*  --  22  --  21 19  CREATININE 0.94  --  0.84  --  0.91  --  0.68 0.75  CALCIUM 8.3*  --  8.1*  --  8.2*  --  7.8* 8.0*  MG  --  2.3  --   --  2.2  --  2.0 2.0  PHOS  --   --   --   --  3.4  --  3.2 3.1   Liver Function Tests: Recent Labs  Lab 05/25/22 0534 05/30/22 0251 05/31/22 0433  AST '15 15 23  '$ ALT '15 17 22  '$ ALKPHOS 54 47 47  BILITOT 1.1 0.6 0.7  PROT 6.3* 5.5* 5.5*  ALBUMIN 3.7 2.7* 2.8*   No results for input(s): "LIPASE", "AMYLASE" in the last 168 hours. No results for input(s): "AMMONIA" in the last 168 hours. CBC: Recent Labs  Lab 05/25/22 0534 05/27/22 0553 05/29/22 0458 05/30/22 0251  WBC 4.6 4.5 5.8 8.1  HGB 14.8 13.6 13.1 12.0*  HCT 44.0 40.8 39.1 35.6*  MCV 92.8 94.9 95.1 94.2  PLT 307 249 267 284   Cardiac Enzymes: No results for input(s): "CKTOTAL", "CKMB", "CKMBINDEX", "TROPONINI" in the last 168 hours. BNP: Invalid input(s): "POCBNP" CBG: Recent Labs  Lab 05/30/22 1345 05/30/22 2202 05/31/22 0601 05/31/22 0744 05/31/22 1333  GLUCAP 106* 114* 129* 110* 108*   D-Dimer No results for input(s): "DDIMER" in the last 72 hours. Hgb A1c No results for input(s): "HGBA1C" in the last 72 hours. Lipid Profile Recent Labs    05/30/22 0251  TRIG 78   Thyroid function studies No results for input(s): "TSH", "T4TOTAL", "T3FREE", "THYROIDAB" in the last 72 hours.  Invalid input(s): "FREET3" Anemia work up No results for input(s): "VITAMINB12", "FOLATE", "FERRITIN", "TIBC", "IRON", "RETICCTPCT" in the last 72 hours. Urinalysis    Component Value Date/Time   COLORURINE COLORLESS (A) 05/24/2022 1040   APPEARANCEUR CLEAR 05/24/2022 1040   LABSPEC 1.028 05/24/2022 1040    PHURINE 6.0 05/24/2022 1040   GLUCOSEU NEGATIVE 05/24/2022 1040   HGBUR NEGATIVE 05/24/2022 1040   BILIRUBINUR NEGATIVE 05/24/2022 1040   KETONESUR NEGATIVE 05/24/2022 1040   PROTEINUR NEGATIVE 05/24/2022 1040   UROBILINOGEN 1.0 10/15/2009 0925   NITRITE NEGATIVE 05/24/2022 1040   LEUKOCYTESUR NEGATIVE 05/24/2022 1040   Sepsis Labs Recent Labs  Lab 05/25/22 0534 05/27/22 0553 05/29/22 0458 05/30/22 0251  WBC 4.6 4.5 5.8 8.1   Microbiology Recent Results (from the past 240 hour(s))  Resp Panel by RT-PCR (Flu A&B, Covid) Anterior Nasal Swab     Status: None   Collection Time: 05/24/22 10:06 AM   Specimen: Anterior Nasal Swab  Result Value Ref Range Status   SARS Coronavirus 2 by RT PCR NEGATIVE NEGATIVE Final    Comment: (NOTE) SARS-CoV-2 target nucleic acids are NOT DETECTED.  The SARS-CoV-2 RNA is generally detectable in upper respiratory specimens during the acute phase of infection. The lowest concentration of SARS-CoV-2 viral copies this assay can detect is 138 copies/mL. A negative result does not preclude SARS-Cov-2 infection and should not be used as the sole basis for treatment or other patient management decisions. A negative result may occur with  improper specimen collection/handling, submission of specimen other than nasopharyngeal swab, presence of viral mutation(s) within the areas targeted by this assay, and inadequate number of viral copies(<138 copies/mL). A negative result must be combined with clinical observations, patient history, and epidemiological information. The expected result is Negative.  Fact Sheet for Patients:  EntrepreneurPulse.com.au  Fact Sheet for Healthcare Providers:  IncredibleEmployment.be  This test is no t yet approved or cleared by the [place] FDA and  has been authorized for detection and/or diagnosis of SARS-CoV-2 by FDA under an Emergency Use Authorization (EUA). This EUA will  remain  in effect (meaning this test can be used) for the duration  of the COVID-19 declaration under Section 564(b)(1) of the Act, 21 U.S.C.section 360bbb-3(b)(1), unless the authorization is terminated  or revoked sooner.       Influenza A by PCR NEGATIVE NEGATIVE Final   Influenza B by PCR NEGATIVE NEGATIVE Final    Comment: (NOTE) The Xpert Xpress SARS-CoV-2/FLU/RSV plus assay is intended as an aid in the diagnosis of influenza from Nasopharyngeal swab specimens and should not be used as a sole basis for treatment. Nasal washings and aspirates are unacceptable for Xpert Xpress SARS-CoV-2/FLU/RSV testing.  Fact Sheet for Patients: EntrepreneurPulse.com.au  Fact Sheet for Healthcare Providers: IncredibleEmployment.be  This test is not yet approved or cleared by the [place] FDA and has been authorized for detection and/or diagnosis of SARS-CoV-2 by FDA under an Emergency Use Authorization (EUA). This EUA will remain in effect (meaning this test can be used) for the duration of the COVID-19 declaration under Section 564(b)(1) of the Act, 21 U.S.C. section 360bbb-3(b)(1), unless the authorization is terminated or revoked.  Performed at [facility], [address] [place], [place] [address]    Time coordinating discharge: 35 minutes  SIGNED: Antonieta Pert, MD  [facility] 05/31/2022, 1:53 PM  If 7PM-7AM, please contact night-coverage [email or web address]

## 2022-05-31 NOTE — Progress Notes (Addendum)
2 Days Post-Op   Subjective/Chief Complaint: Pain controlled. Reports 3 BMs yesterday, having some flatus. Mobilizing in his room. Tolerated grits for breakfast/FLD. Reports his urine is now more straw colored.   Objective: Vital signs in last 24 hours: Temp:  [98.4 F (36.9 C)-98.6 F (37 C)] 98.6 F (37 C) (09/19 2202) Pulse Rate:  [56-59] 59 (09/19 2202) Resp:  [18-20] 20 (09/19 2202) BP: (123-153)/(73-80) 153/80 (09/19 2202) SpO2:  [96 %-97 %] 96 % (09/19 2202) Last BM Date : 05/31/22  Intake/Output from previous day: 09/19 0701 - 09/20 0700 In: 792.1 [I.V.:792.1] Out: 675 [Urine:675] Intake/Output this shift: Total I/O In: 360 [P.O.:360] Out: 200 [Urine:200]   Gen:  Alert, NAD, pleasant Card:  Regular rate and rhythm Pulm:  Normal effort Abd: Soft, mild distention, nontender, incisions c/d/I, mild ecchymosis around epigastric incision Skin: warm and dry, no rashes  Psych: A&Ox3   Lab Results:  Recent Labs    05/29/22 0458 05/30/22 0251  WBC 5.8 8.1  HGB 13.1 12.0*  HCT 39.1 35.6*  PLT 267 284   BMET Recent Labs    05/30/22 0251 05/31/22 0433  NA 137 139  K 4.0 3.6  CL 107 109  CO2 26 25  GLUCOSE 167* 129*  BUN 21 19  CREATININE 0.68 0.75  CALCIUM 7.8* 8.0*   PT/INR No results for input(s): "LABPROT", "INR" in the last 72 hours. ABG No results for input(s): "PHART", "HCO3" in the last 72 hours.  Invalid input(s): "PCO2", "PO2"  Studies/Results: No results found.  Anti-infectives: Anti-infectives (From admission, onward)    Start     Dose/Rate Route Frequency Ordered Stop   05/29/22 0930  ceFAZolin (ANCEF) IVPB 2g/100 mL premix        2 g 200 mL/hr over 30 Minutes Intravenous On call to O.R. 05/28/22 2015 05/29/22 1437       Assessment/Plan:  Small bowel obstruction  POD#2 s/p diagnostic laparoscopy, small bowel resection with anastomosis 05/29/22 Dr. 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  - AFVSS - bowel  function returned, advance to SOFT diet - IS/mobilize - PRN analgesics/antiemetics - having bowel function, pain controlled, tolerating FLD. Advance diet to soft. If tolerates SOFT diet I think it is safe for patient to be discharged home today. I will arrange his outpatient follow up with Dr. Kieth Brightly. He is not taking narcotics. Will need outpatient F/U with Dr. Alinda Money as well for bladder/renal lesions.     FEN - SOFT, stop TPN VTE - SCD's, Lovenox ID -none indicated Foley- remove POD#1, spont voids Admit -TRH service     HTN HLD PMH Prostate CA Incidental finding of a small bladder mass and small renal mass on CT, will need outpatient follow-up with urology (Dr. Alinda Money)        I reviewed nursing notes, hospitalist notes, last 24 h vitals and pain scores, last 48 h intake and output, last 24 h labs and trends, and last 24 h imaging results.   LOS: 7 days    Jill Alexanders MD  05/31/2022    Total time 30 minutes discussing surgery, postop recovery, complications, face-to-face time, documentation, and review of vital signs, labs, imaging studies

## 2022-05-31 NOTE — Discharge Instructions (Signed)
[facility]      [facility] [phone]  OPEN ABDOMINAL SURGERY: POST OP INSTRUCTIONS  Always review your discharge instruction sheet given to you by the facility where your surgery was performed.  IF YOU HAVE DISABILITY OR FAMILY LEAVE FORMS, YOU MUST BRING THEM TO THE OFFICE FOR PROCESSING.  PLEASE DO NOT GIVE THEM TO YOUR DOCTOR.  A prescription for pain medication may be given to you upon discharge.  Take your pain medication as prescribed, if needed.  If narcotic pain medicine is not needed, then you may take acetaminophen (Tylenol) or ibuprofen (Advil) as needed. Take your usually prescribed medications unless otherwise directed. If you need a refill on your pain medication, please contact your pharmacy. They will contact our office to request authorization.  Prescriptions will not be filled after 5pm or on week-ends. You should follow a light diet the first few days after arrival home, such as soup and crackers, pudding, etc.unless your doctor has advised otherwise. A high-fiber, low fat diet can be resumed as tolerated.   Be sure to include lots of fluids daily. Most patients will experience some swelling and bruising on the chest and neck area.  Ice packs will help.  Swelling and bruising can take several days to resolve Most patients will experience some swelling and bruising in the area of the incision. Ice pack will help. Swelling and bruising can take several days to resolve..  It is common to experience some constipation if taking pain medication after surgery.  Increasing fluid intake and taking a stool softener will usually help or prevent this problem from occurring.  A mild laxative (Milk of Magnesia or Miralax) should be taken according to package directions if there are no bowel movements after 48 hours.  You may have steri-strips (small skin tapes) in place directly over the incision.  These strips should be left on the skin for 7-10 days.  If your surgeon used skin  glue on the incision, you may shower in 24 hours.  The glue will flake off over the next 2-3 weeks.  Any sutures or staples will be removed at the office during your follow-up visit. You may find that a light gauze bandage over your incision may keep your staples from being rubbed or pulled. You may shower and replace the bandage daily. ACTIVITIES:  You may resume regular (light) daily activities beginning the next day--such as daily self-care, walking, climbing stairs--gradually increasing activities as tolerated.  You may have sexual intercourse when it is comfortable.  Refrain from any heavy lifting or straining until approved by your doctor. You may drive when you no longer are taking prescription pain medication, you can comfortably wear a seatbelt, and you can safely maneuver your car and apply brakes Return to Work: ___________________________________ You should see your doctor in the office for a follow-up appointment approximately two weeks after your surgery.  Make sure that you call for this appointment within a day or two after you arrive home to insure a convenient appointment time. OTHER INSTRUCTIONS:  _____________________________________________________________ _____________________________________________________________  WHEN TO CALL YOUR DOCTOR: Fever over 101.0 Inability to urinate Nausea and/or vomiting Extreme swelling or bruising Continued bleeding from incision. Increased pain, redness, or drainage from the incision. Difficulty swallowing or breathing Muscle cramping or spasms. Numbness or tingling in hands or feet or around lips.  The clinic staff is available to answer your questions during regular business hours.  Please don't hesitate to call and ask to speak to one of   the nurses if you have concerns.  For further questions, please visit [email or web address]  

## 2022-05-31 NOTE — Progress Notes (Signed)
  Transition of Care [employer] [facility]) Screening Note   Patient Details  Name: Troy [name] Date of Birth: Dec 01, 1945   [facility] of [facility]) CM/SW Contact:    Vassie Moselle, LCSW Phone Number: 05/31/2022, 8:28 AM    Transition of Care Department [facility]) has reviewed patient and no TOC needs have been identified at this time. We will continue to monitor patient advancement through interdisciplinary progression rounds. If new patient transition needs arise, please place a TOC consult.

## 2022-05-31 NOTE — Progress Notes (Signed)
Verbal and written AVS discharge instructions given to Troy [name] and wife. Pt verbalized understandment of discharge instructions. Pt educated on signs and symptoms of infection to lapsites. Pt able to teach back reasons to contact MD. Wheelchair to lobby.

## 2022-05-31 NOTE — Progress Notes (Signed)
PHARMACY - TOTAL PARENTERAL NUTRITION CONSULT NOTE   Indication: Prolonged ileus anticipated  Patient Measurements: Height: '5\' 9"'$  (175.3 cm) Weight: 106.6 kg (235 lb) IBW/kg (Calculated) : 70.7 TPN AdjBW (KG): 79.7 Body mass index is 34.7 kg/m. Usual Weight:   Assessment:  76 y.o. male with hx of prostate CA, HLD, HTN who presents with nausea/vomiting and abdominal pain. CT abd and pelvis high-grade SBO, scattered free fluid in the abdomen and pelvis.  CCS consulted and managing conservatively with NGT, but patient did not tolerate clamping and now planning laparotomy 9/18.  Glucose / Insulin: No hx DM - Goal CBGs 100-150 - CBGs 106-129, 1 unit SSI required Electrolytes: K+ at low end of goal; others WNL including CorrCa Renal: SCr < 1, BUN wnl Hepatic: WNL, alb low  Triglycerides: WNL (78 on 9/19) Intake / Output: inaccurate, NG out, LBM 9/19, UOP 675cc, foley removed MIVF: NS at [place]: - 9/13 CT: High-grade Small-bowel Obstruction. Scattered free fluid in the abdomen and pelvis. - 9/16 KUB shows no significant change in bowel gas pattern with persistent dilated small bowel. GI Surgeries / Procedures:  - 9/18: laparoscopic small bowel resection with anastomosis  Central access: PICC 9/18 TPN start date: 9/18  Nutritional Goals: Goal TPN rate is 80 mL/hr (provides 99 g of protein and 1954 kcals per day)  RD Assessment: pending Estimated Needs Total Energy Estimated Needs: 1850-2050 Total Protein Estimated Needs: 90-100g Total Fluid Estimated Needs: 2L/day  Current Nutrition:  FLD TPN at 60 ml/hr  Plan:  Now: KCl 74mq IV x 3 runs  Increase TPN to goal 81mhr at 1800 Electrolytes in TPN:  Na 503mL,  K 33m71m,  Ca 5mEq45m  Mg 5mEq/68m Phos 15mmol49m Cl:Ac 1:1 Add standard MVI and trace elements to TPN Continue Sensitive q8h SSI and adjust as needed  MIVF at [facility]   [facility] Mon/Thurs  Troy [name] WiPeggyann JubaD, BCPS Pharmacy:  [phone],7:13 AM

## 2022-05-31 NOTE — Progress Notes (Signed)
Was not able to get V/S before end of shift. Got BSC and inulin administered but pt had to get up to go the bathroom. Told  pt to call when he was finished and I would return to do V/S and asked if he needed anything he said,"no". Pt didn't push button until end of report.

## 2022-06-05 ENCOUNTER — Other Ambulatory Visit: Payer: Self-pay | Admitting: Urology

## 2022-06-06 NOTE — Patient Instructions (Signed)
SURGICAL WAITING ROOM VISITATION Patients having surgery or a procedure may have no more than 2 support people in the waiting area - these visitors may rotate.   Children under the age of 12 must have an adult with them who is not the patient. If the patient needs to stay at the hospital during part of their recovery, the visitor guidelines for inpatient rooms apply. Pre-op nurse will coordinate an appropriate time for 1 support person to accompany patient in pre-op.  This support person may not rotate.    Please refer to the [facility] website for the visitor guidelines for Inpatients (after your surgery is over and you are in a regular room).       Your procedure is scheduled on:  06/15/2022    Report to [facility] Main Entrance    Report to admitting at   0810AM   Call this number if you have problems the morning of surgery [phone]   Do not eat food :After Midnight.   After Midnight you may have the following liquids until __ 0700____ AM DAY OF SURGERY  Water Non-Citrus Juices (without pulp, NO RED) Carbonated Beverages Black Coffee (NO MILK/CREAM OR CREAMERS, sugar ok)  Clear Tea (NO MILK/CREAM OR CREAMERS, sugar ok) regular and decaf                             Plain Jell-O (NO RED)                                           Fruit ices (not with fruit pulp, NO RED)                                     Popsicles (NO RED)                                                               Sports drinks like Gatorade (NO RED)                         If you have questions, please contact your surgeon's office.       Oral Hygiene is also important to reduce your risk of infection.                                    Remember - BRUSH YOUR TEETH THE MORNING OF SURGERY WITH YOUR REGULAR TOOTHPASTE   Do NOT smoke after Midnight   Take these medicines the morning of surgery with A SIP OF WATER:  toprol   DO NOT TAKE ANY ORAL DIABETIC MEDICATIONS DAY OF YOUR  SURGERY  Bring CPAP mask and tubing day of surgery.                              You may not have any metal on your body including hair pins, jewelry, and body piercing  Do not wear make-up, lotions, powders, perfumes/cologne, or deodorant  Do not wear nail polish including gel and S&S, artificial/acrylic nails, or any other type of covering on natural nails including finger and toenails. If you have artificial nails, gel coating, etc. that needs to be removed by a nail salon please have this removed prior to surgery or surgery may need to be canceled/ delayed if the surgeon/ anesthesia feels like they are unable to be safely monitored.   Do not shave  48 hours prior to surgery.               Men may shave face and neck.   Do not bring valuables to the hospital. [place].   Contacts, dentures or bridgework may not be worn into surgery.   Bring small overnight bag day of surgery.   DO NOT [place]. PHARMACY WILL DISPENSE MEDICATIONS LISTED ON YOUR MEDICATION LIST TO YOU DURING YOUR ADMISSION [place]!    Patients discharged on the day of surgery will not be allowed to drive home.  Someone NEEDS to stay with you for the first 24 hours after anesthesia.   Special Instructions: Bring a copy of your healthcare power of attorney and living will documents the day of surgery if you haven't scanned them before.              Please read over the following fact sheets you were given: IF [place] [phone]   If you received a COVID test during your pre-op visit  it is requested that you wear a mask when out in public, stay away from anyone that may not be feeling well and notify your surgeon if you develop symptoms. If you test positive for Covid or have been in contact with anyone that has tested positive in the last 10 days please notify  you surgeon.     [facility] - Preparing for Surgery Before surgery, you can play an important role.  Because skin is not sterile, your skin needs to be as free of germs as possible.  You can reduce the number of germs on your skin by washing with CHG (chlorahexidine gluconate) soap before surgery.  CHG is an antiseptic cleaner which kills germs and bonds with the skin to continue killing germs even after washing. Please DO NOT use if you have an allergy to CHG or antibacterial soaps.  If your skin becomes reddened/irritated stop using the CHG and inform your nurse when you arrive at Short Stay. Do not shave (including legs and underarms) for at least 48 hours prior to the first CHG shower.  You may shave your face/neck. Please follow these instructions carefully:  1.  Shower with CHG Soap the night before surgery and the  morning of Surgery.  2.  If you choose to wash your hair, wash your hair first as usual with your  normal  shampoo.  3.  After you shampoo, rinse your hair and body thoroughly to remove the  shampoo.                           4.  Use CHG as you would any other liquid soap.  You can apply chg directly  to the skin and wash  Gently with a scrungie or clean washcloth.  5.  Apply the CHG Soap to your body ONLY FROM THE NECK DOWN.   Do not use on face/ open                           Wound or open sores. Avoid contact with eyes, ears mouth and genitals (private parts).                       Wash face,  Genitals (private parts) with your normal soap.             6.  Wash thoroughly, paying special attention to the area where your surgery  will be performed.  7.  Thoroughly rinse your body with warm water from the neck down.  8.  DO NOT shower/wash with your normal soap after using and rinsing off  the CHG Soap.                9.  Pat yourself dry with a clean towel.            10.  Wear clean pajamas.            11.  Place clean sheets on your bed the night of your  first shower and do not  sleep with pets. Day of Surgery : Do not apply any lotions/deodorants the morning of surgery.  Please wear clean clothes to the hospital/surgery center.  FAILURE TO FOLLOW THESE INSTRUCTIONS MAY RESULT IN THE CANCELLATION OF YOUR SURGERY PATIENT SIGNATURE_________________________________  NURSE SIGNATURE__________________________________  ________________________________________________________________________

## 2022-06-06 NOTE — Progress Notes (Signed)
Anesthesia Review:  PCP: Cardiologist : Chest x-ray : EKG : Echo : Stress test: Cardiac Cath :  Activity level:  Sleep Study/ CPAP : Fasting Blood Sugar :      / Checks Blood Sugar -- times a day:   Blood Thinner/ Instructions /Last Dose: ASA / Instructions/ Last Dose :  05/30/22-CBC  05/21/22- CMP 05/24/22- IN ED with Small Bowel Obstruction  05/29/22- SB Resection  05/31/22- Discharged.

## 2022-06-07 ENCOUNTER — Other Ambulatory Visit: Payer: Self-pay

## 2022-06-07 ENCOUNTER — Encounter (HOSPITAL_COMMUNITY)
Admission: RE | Admit: 2022-06-07 | Discharge: 2022-06-07 | Disposition: A | Discharge status: Home / Self Care | Payer: Medicare Other | Source: Ambulatory Visit | Attending: Urology | Admitting: Urology

## 2022-06-07 ENCOUNTER — Encounter (HOSPITAL_COMMUNITY): Payer: Self-pay

## 2022-06-07 ENCOUNTER — Other Ambulatory Visit: Payer: Self-pay | Admitting: Urology

## 2022-06-07 VITALS — BP 139/83 | HR 78 | Temp 98.5°F | Resp 16 | Ht 69.0 in | Wt 227.0 lb

## 2022-06-07 DIAGNOSIS — D49511 Neoplasm of unspecified behavior of right kidney: Secondary | ICD-10-CM

## 2022-06-07 DIAGNOSIS — Z01818 Encounter for other preprocedural examination: Secondary | ICD-10-CM | POA: Insufficient documentation

## 2022-06-07 HISTORY — DX: Sleep apnea, unspecified: G47.30

## 2022-06-07 LAB — CBC
HCT: 41.2 % (ref 39.0–52.0)
Hemoglobin: 13.6 g/dL (ref 13.0–17.0)
MCH: 31.3 pg (ref 26.0–34.0)
MCHC: 33 g/dL (ref 30.0–36.0)
MCV: 94.7 fL (ref 80.0–100.0)
Platelets: 393 10*3/uL (ref 150–400)
RBC: 4.35 MIL/uL (ref 4.22–5.81)
RDW: 13.1 % (ref 11.5–15.5)
WBC: 8.2 10*3/uL (ref 4.0–10.5)
nRBC: 0 % (ref 0.0–0.2)

## 2022-06-07 LAB — BASIC METABOLIC PANEL
Anion gap: 5 (ref 5–15)
BUN: 19 mg/dL (ref 8–23)
CO2: 27 mmol/L (ref 22–32)
Calcium: 9 mg/dL (ref 8.9–10.3)
Chloride: 108 mmol/L (ref 98–111)
Creatinine, Ser: 1.01 mg/dL (ref 0.61–1.24)
GFR, Estimated: >60 mL/min (ref >60)
Glucose, Bld: 121 mg/dL — ABNORMAL HIGH (ref 70–99)
Potassium: 3.8 mmol/L (ref 3.5–5.1)
Sodium: 140 mmol/L (ref 135–145)

## 2022-06-14 NOTE — H&P (Signed)
Office Visit Report     06/05/2022   --------------------------------------------------------------------------------   Troy [name]  MRN: [id number]  DOB: October 11, 1945, 76 year old Male  SSN: [id number]   PRIMARY CARE:  Marylene Land, MD  REFERRING:  Nadeen Landau, MD  PROVIDER:  Raynelle Bring, M.D.  LOCATION:  [facility] 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 Tresa Endo in 2011 and resultant stress incontinence treated by a male sling by Dr. Bjorn Loser in 2012. His most recent PSA has continued to be undetectable and his incontinence is slightly worsened although he still does not require pad use.   He has had multiple abdominal surgeries in the past including a hernia repair. He recently was hospitalized for a high-grade small bowel obstruction that ultimately required laparoscopic adhesiolysis and resection of his descending colon for diverticular disease. He is recovering appropriately. During his evaluation, he did have a CT scan of the abdomen and pelvis with contrast performed on 05/24/2022. This incidentally detected a 1.2 cm hyperdense lesion of the posterior aspect of the right kidney concerning for a possible renal cell carcinoma. There is also noted a 1.8 cm tumor off the posterior left bladder wall concerning for possible bladder tumor. He is a non-smoker and denies hematuria. He has no family history of kidney cancer or end-stage renal disease. He presents today for further evaluation of these incidental findings.     ALLERGIES: Ciprofloxacin    MEDICATIONS: Crestor 20 mg tablet  Hydrochlorothiazide 25 mg tablet  Metoprolol Tartrate 50 mg tablet  Cozaar 100 mg tablet  Turmeric  Vitamin D3  Xanax TABS  Oral  Zetia 10 mg tablet     GU PSH: Male Sling - 2012       PSH Notes: Operation For Correction Of Male Urinary Incontinence, Prostatectomy Robotic-Assisted, Esophageal Dilation   NON-GU PSH: No Non-GU PSH    GU PMH: BPH w/o LUTS, Benign Prostatic Hypertrophy - 2014 ED due to arterial insufficiency, Erectile dysfunction due to arterial insufficiency - 2014 Elevated PSA, PSA,Elevated - 2014 Prostate Cancer, Prostate cancer - 2014 Stress Incontinence, Male stress incontinence - 2014      PMH Notes:  2010-01-13 19:41:47 - Note: Normal Routine History And Physical Adult  2009-05-18 11:16:34 - Note: Arthritis   NON-GU PMH: Anxiety, Anxiety - 2014 Muscle weakness (generalized), Muscle weakness - 2014, Muscle Weakness, - 2014 Other lack of coordination, Poor Coordination - 2014 Personal history of other diseases of the circulatory system, History of hypertension - 2014 Personal history of other specified conditions, History of heartburn - 2014    FAMILY HISTORY: Cancer - Father, Mother Cervical Cancer - Sister Death In The Family Father - Sister Death In The Family Mother - Sister Diabetes - Father Family Health Status Number - Sister Hypertension - Father   SOCIAL HISTORY: No Social History     Notes: Never A Smoker, Living Independently With Spouse, Activities Of Daily Living, Self-reliant In Usual Daily Activities, Exercise Habits, Tobacco Use, Alcohol Use, Occupation:, Caffeine Use, Marital History - Currently Married   REVIEW OF SYSTEMS:    GU Review Male:   Patient denies frequent urination, hard to postpone urination, burning/ pain with urination, get up at night to urinate, leakage of urine, stream starts and stops, trouble starting your streams, and have to strain  to urinate .  Gastrointestinal (Lower):   Patient denies diarrhea and constipation.  Gastrointestinal (Upper):   Patient denies vomiting and nausea.  Constitutional:   Patient denies fever, night sweats,  weight loss, and fatigue.  Skin:   Patient denies skin rash/ lesion and itching.  Eyes:   Patient denies blurred vision and double vision.  Ears/ Nose/ Throat:   Patient denies sore throat and sinus problems.  Hematologic/Lymphatic:   Patient denies swollen glands and easy bruising.  Cardiovascular:   Patient denies leg swelling and chest pains.  Respiratory:   Patient denies cough and shortness of breath.  Endocrine:   Patient denies excessive thirst.  Musculoskeletal:   Patient denies back pain and joint pain.  Neurological:   Patient denies headaches and dizziness.  Psychologic:   Patient denies depression and anxiety.   VITAL SIGNS:      06/05/2022 09:06 AM  Weight 227 lb / 102.97 kg  Height 69 in / 175.26 cm  BP 111/70 mmHg  Pulse 91 /min  Temperature 98.0 F / 36.6 C  BMI 33.5 kg/m   GU PHYSICAL EXAMINATION:    Urethral Meatus: Normal size. No lesion, no wart, no discharge, no polyp. Normal location.   MULTI-SYSTEM PHYSICAL EXAMINATION:    Constitutional: Well-nourished. No physical deformities. Normally developed. Good grooming.  Neck: Neck symmetrical, not swollen. Normal tracheal position.  Respiratory: No labored breathing, no use of accessory muscles.   Cardiovascular: Normal temperature, normal extremity pulses, no swelling, no varicosities.  Gastrointestinal: No mass, no tenderness, no rigidity, non obese abdomen. His abdominal incisions are healing appropriately.     Complexity of Data:  Records Review:   Previous Patient Records  X-Ray Review: C.T. Abdomen/Pelvis: Reviewed Films.     11/26/09 05/18/09  PSA  Total PSA <0.04  8.28   Free PSA  0.93   % Free PSA  11.2    Notes:                     CLINICAL DATA: 76 year old male with left lower quadrant pain for 2  days. History of diverticulitis.   EXAM:  CT ABDOMEN AND PELVIS WITH CONTRAST   TECHNIQUE:  Multidetector CT imaging of the abdomen and pelvis was performed  using the standard protocol  following bolus administration of  intravenous contrast.   RADIATION DOSE REDUCTION: This exam was performed according to the  departmental dose-optimization program which includes automated  exposure control, adjustment of the mA and/or kV according to  patient size and/or use of iterative reconstruction technique.   CONTRAST: 72m OMNIPAQUE IOHEXOL 300 MG/ML SOLN   COMPARISON: CT Abdomen and Pelvis 06/19/2018.   FINDINGS:  Lower chest: Stable tortuous descending thoracic aorta. No  pericardial or pleural effusion. Mild lung base atelectasis or  scarring.   Hepatobiliary: Trace perihepatic free fluid. Small chronic  gallstones. No CT evidence of acute cholecystitis. No bile duct  enlargement.   Pancreas: Fatty atrophied.   Spleen: Negative.   Adrenals/Urinary Tract: Adrenal glands and left kidney are stable  and negative. Right renal lower pole seemingly enhancing exophytic  nodule measuring 12 mm on series 5, image 41. A tiny low-density  area was present here in 2019. Symmetric renal enhancement and  contrast excretion.   Decompressed bladder. However, there is an abnormal 1.8 cm lobulated  bladder hyperdensity along the left posterior wall near the left  UVJ. No distal ureteral dilatation. See series 2, image 84 and  series 5, image 41.  Stomach/Bowel:   Mostly decompressed rectosigmoid colon. Extensive sigmoid  diverticulosis. Extensive diverticulosis in the descending colon  which is decompressed. Small volume of simple density free fluid in  the left gutter. Decompressed transverse colon. Mild retained gas  and stool in the right colon. Small volume of simple density fluid  in the right pericolic gutter. Appendix appears to remain normal on  coronal image 69. No large bowel inflammation.   The terminal ileum is decompressed. There are multiple decompressed  loops of small bowel in the anterior pelvis.   Upstream small bowel obstruction with dilated air in  fluid-filled  loops up to 37 mm diameter beginning in the proximal jejunum and  tracking distally. There is an abrupt transition in the mid abdomen  on series 2, image 56. In the immediate upstream loops are among the  most inflamed. Note sigmoid mesenteric congestion on series 2, image  48. small volume of free fluid in the affected small bowel  mesentery.   No pneumoperitoneum. Chronic postoperative changes to the  gastroesophageal junction. Duodenum and ligament of Treitz are  decompressed.   Vascular/Lymphatic: Aortoiliac calcified atherosclerosis. Major  arterial structures in the abdomen and pelvis are patent. No  lymphadenopathy identified.   Reproductive: Chronic right inguinal hernia now contains a small  volume of free fluid in addition to mesenteric fat. But no herniated  bowel loop.   Other: Small volume of free fluid in the pelvis has simple fluid  density.   Musculoskeletal: Progression of lumbar spine degeneration since  2019. New vacuum disc and progressed endplate degeneration at L3-L4.  No acute osseous abnormality identified.   IMPRESSION:  1. High-grade Small-bowel Obstruction. Scattered free fluid in the  abdomen and pelvis. Abrupt transition point in the mid abdomen on  series 2, image 56. Main differential considerations are obstruction  due to adhesion or internal hernia. Recommend Surgery consultation.  No pneumoperitoneum.   2. Appearance suspicious for both a 1.8 cm left posterior Bladder  Wall Mass (series 2, image 84), and a small new solid right renal  lower pole mass (1.2 cm series 5, image 41). Consider Bladder and  Renal Cell Carcinoma. Recommend follow-up with Urology.   3. A chronic right inguinal hernia now contains free fluid but there  is no herniated bowel loop.   4. Chronic cholelithiasis without CT evidence of acute  cholecystitis. Extensive diverticulosis of distal colon. Aortic  Atherosclerosis (ICD10-I70.0).    Electronically  Signed  By: Genevie Ann M.D.  On: 05/24/2022 10:48   PROCEDURES:         Flexible Cystoscopy - 52000  Indication: Bladder tumor Risks, benefits, and potential complications of the procedure were discussed with the patient including infection, bleeding, voiding discomfort, urinary retention, fever, chills, sepsis, and others. All questions were answered. Informed consent was obtained. Sterile technique and intraurethral analgesia were used.  Meatus:  Normal size. Normal location. Normal condition.  Urethra:  No strictures.  External Sphincter:  Normal.  Verumontanum:  Normal.  Prostate:  Prostate Surgically Absent.  Bladder Neck:  Non-obstructing.  Ureteral Orifices:  Normal location. Normal size. Normal shape. Effluxed clear urine.  Bladder:  A systematic examination of the bladder was performed. Just beyond the left ureteral orifice on the left side of the bladder, there was noted to be a papillary tumor measuring approximately 2 cm. This was not involving the ureteral orifice. A bladder washing was obtained for cytology.      Chaperone: SM The procedure was well-tolerated and without complications.  Instructions were given to call the office immediately if questions or problems.         Urinalysis w/Scope Dipstick Dipstick Cont'd Micro  Color: Yellow Bilirubin: Neg mg/dL WBC/hpf: 0 - 5/hpf  Appearance: Slightly Cloudy Ketones: Neg mg/dL RBC/hpf: 0 - 2/hpf  Specific Gravity: 1.025 Blood: Neg ery/uL Bacteria: Mod (26-50/hpf)  pH: 5.5 Protein: Neg mg/dL Cystals: NS (Not Seen)  Glucose: Neg mg/dL Urobilinogen: 0.2 mg/dL Casts: Hyaline    Nitrites: Neg Trichomonas: Not Present    Leukocyte Esterase: Neg leu/uL Mucous: Present      Epithelial Cells: 0 - 5/hpf      Yeast: NS (Not Seen)      Sperm: Not Present    Notes: and granular    ASSESSMENT:      ICD-10 Details  1 GU:   Prostate Cancer - C61   2   Right renal neoplasm - D49.511   3   Bladder tumor/neoplasm - D41.4    PLAN:            Orders Labs Urine Cytology, Urine Culture  Lab Notes: collected during cysto  X-Rays: MRI Abdomen With and Without I.V. Contrast - Further evaluate right renal mass seen on prior CT scan. Okay to schedule this anytime over the next 2 weeks.          Schedule Return Visit/Planned Activity: Other See Visit Notes             Note: Will call to schedule surgery.          Document Letter(s):  Created for Patient: Clinical Summary         Notes:   1. Bladder tumor: I reviewed his cystoscopic findings which confirm a left-sided papillary bladder tumor. He was provided educational information regarding bladder cancer today. I recommended proceeding with cystoscopy, pelvic exam under anesthesia, bilateral retrograde pyelography, and transurethral resection of his bladder tumor. We have reviewed this procedure in detail including the potential risks and recovery process. He also will be administered postoperative intravesical gemcitabine if appropriate. His urine is most likely contaminated today but will be cultured as a precaution. He does have a school reunion in [place] and plans to leave on October 18. We will try to schedule his surgery and his follow-up postoperative appointment before that date.   2. Right renal neoplasm: This does look suspicious for a possible early renal cell carcinoma. We discussed this finding. This does require more definitive evaluation and he will be scheduled for an MRI of the abdomen with and without IV contrast sometime between now and his postoperative appointment after his bladder tumor resection.   CC: Dr. Derinda Late  Dr. Nadeen Landau    * Signed by Raynelle Bring, M.D. on 06/05/22 at 12:01 PM (EDT)*

## 2022-06-15 ENCOUNTER — Ambulatory Visit (HOSPITAL_COMMUNITY)
Admission: RE | Admit: 2022-06-15 | Discharge: 2022-06-15 | Disposition: A | Discharge status: Home / Self Care | Payer: Medicare Other | Attending: Urology | Admitting: Urology

## 2022-06-15 ENCOUNTER — Encounter (HOSPITAL_COMMUNITY): Payer: Self-pay | Admitting: Urology

## 2022-06-15 ENCOUNTER — Ambulatory Visit (HOSPITAL_COMMUNITY): Payer: Medicare Other | Admitting: Physician Assistant

## 2022-06-15 ENCOUNTER — Ambulatory Visit (HOSPITAL_BASED_OUTPATIENT_CLINIC_OR_DEPARTMENT_OTHER): Payer: Medicare Other | Admitting: Anesthesiology

## 2022-06-15 ENCOUNTER — Ambulatory Visit (HOSPITAL_COMMUNITY): Payer: Medicare Other

## 2022-06-15 ENCOUNTER — Encounter (HOSPITAL_COMMUNITY)
Admission: RE | Disposition: A | Discharge status: Home / Self Care | Payer: Self-pay | Source: Home / Self Care | Attending: Urology

## 2022-06-15 DIAGNOSIS — D494 Neoplasm of unspecified behavior of bladder: Secondary | ICD-10-CM | POA: Diagnosis not present

## 2022-06-15 DIAGNOSIS — C676 Malignant neoplasm of ureteric orifice: Secondary | ICD-10-CM | POA: Diagnosis present

## 2022-06-15 DIAGNOSIS — I10 Essential (primary) hypertension: Secondary | ICD-10-CM | POA: Insufficient documentation

## 2022-06-15 DIAGNOSIS — Z79899 Other long term (current) drug therapy: Secondary | ICD-10-CM | POA: Insufficient documentation

## 2022-06-15 DIAGNOSIS — Z6833 Body mass index (BMI) 33.0-33.9, adult: Secondary | ICD-10-CM | POA: Insufficient documentation

## 2022-06-15 DIAGNOSIS — Z8546 Personal history of malignant neoplasm of prostate: Secondary | ICD-10-CM | POA: Diagnosis not present

## 2022-06-15 DIAGNOSIS — G4730 Sleep apnea, unspecified: Secondary | ICD-10-CM | POA: Insufficient documentation

## 2022-06-15 DIAGNOSIS — Z01818 Encounter for other preprocedural examination: Secondary | ICD-10-CM

## 2022-06-15 DIAGNOSIS — G4733 Obstructive sleep apnea (adult) (pediatric): Secondary | ICD-10-CM

## 2022-06-15 DIAGNOSIS — E669 Obesity, unspecified: Secondary | ICD-10-CM | POA: Diagnosis not present

## 2022-06-15 DIAGNOSIS — Z9989 Dependence on other enabling machines and devices: Secondary | ICD-10-CM | POA: Diagnosis not present

## 2022-06-15 HISTORY — PX: TRANSURETHRAL RESECTION OF BLADDER TUMOR: SHX2575

## 2022-06-15 SURGERY — TURBT (TRANSURETHRAL RESECTION OF BLADDER TUMOR)
Anesthesia: General

## 2022-06-15 MED ORDER — DEXAMETHASONE SODIUM PHOSPHATE 10 MG/ML IJ SOLN
INTRAMUSCULAR | Status: DC | PRN
  Administered 2022-06-15: 10 mg via INTRAVENOUS

## 2022-06-15 MED ORDER — CEFAZOLIN SODIUM-DEXTROSE 2-4 GM/100ML-% IV SOLN
2.0000 g | Freq: Once | INTRAVENOUS | Status: AC
  Administered 2022-06-15: 2 g via INTRAVENOUS
  Filled 2022-06-15: qty 100

## 2022-06-15 MED ORDER — OXYCODONE HCL 5 MG/5ML PO SOLN: 5.0000 mg | Freq: Once | ORAL | Status: DC | PRN

## 2022-06-15 MED ORDER — LACTATED RINGERS IV SOLN: INTRAVENOUS | Status: DC

## 2022-06-15 MED ORDER — PROPOFOL 10 MG/ML IV BOLUS
INTRAVENOUS | Status: DC | PRN
  Administered 2022-06-15: 120 mg via INTRAVENOUS

## 2022-06-15 MED ORDER — FENTANYL CITRATE (PF) 100 MCG/2ML IJ SOLN
INTRAMUSCULAR | Status: AC
  Filled 2022-06-15: qty 2

## 2022-06-15 MED ORDER — GEMCITABINE CHEMO FOR BLADDER INSTILLATION 2000 MG
2000.0000 mg | Freq: Once | INTRAVENOUS | Status: AC
  Administered 2022-06-15: 2000 mg via INTRAVESICAL
  Filled 2022-06-15: qty 2000

## 2022-06-15 MED ORDER — MEPERIDINE HCL 50 MG/ML IJ SOLN: 6.2500 mg | INTRAMUSCULAR | Status: DC | PRN

## 2022-06-15 MED ORDER — LIDOCAINE 2% (20 MG/ML) 5 ML SYRINGE
INTRAMUSCULAR | Status: DC | PRN
  Administered 2022-06-15: 60 mg via INTRAVENOUS

## 2022-06-15 MED ORDER — IOHEXOL 300 MG/ML  SOLN
INTRAMUSCULAR | Status: DC | PRN
  Administered 2022-06-15: 12 mL

## 2022-06-15 MED ORDER — SUGAMMADEX SODIUM 200 MG/2ML IV SOLN
INTRAVENOUS | Status: DC | PRN
  Administered 2022-06-15: 200 mg via INTRAVENOUS

## 2022-06-15 MED ORDER — ROCURONIUM BROMIDE 10 MG/ML (PF) SYRINGE
PREFILLED_SYRINGE | INTRAVENOUS | Status: AC
  Filled 2022-06-15: qty 10

## 2022-06-15 MED ORDER — OXYCODONE HCL 5 MG PO TABS: 5.0000 mg | ORAL_TABLET | Freq: Once | ORAL | Status: DC | PRN

## 2022-06-15 MED ORDER — PHENAZOPYRIDINE HCL 200 MG PO TABS: 200.0000 mg | ORAL_TABLET | Freq: Three times a day (TID) | ORAL | 0 refills | Status: DC | PRN

## 2022-06-15 MED ORDER — PROPOFOL 10 MG/ML IV BOLUS
INTRAVENOUS | Status: AC
  Filled 2022-06-15: qty 20

## 2022-06-15 MED ORDER — ONDANSETRON HCL 4 MG/2ML IJ SOLN
INTRAMUSCULAR | Status: AC
  Filled 2022-06-15: qty 2

## 2022-06-15 MED ORDER — DEXAMETHASONE SODIUM PHOSPHATE 10 MG/ML IJ SOLN
INTRAMUSCULAR | Status: AC
  Filled 2022-06-15: qty 1

## 2022-06-15 MED ORDER — FENTANYL CITRATE (PF) 100 MCG/2ML IJ SOLN
INTRAMUSCULAR | Status: DC | PRN
  Administered 2022-06-15: 50 ug via INTRAVENOUS
  Administered 2022-06-15 (×2): 25 ug via INTRAVENOUS

## 2022-06-15 MED ORDER — FENTANYL CITRATE PF 50 MCG/ML IJ SOSY: 25.0000 ug | PREFILLED_SYRINGE | INTRAMUSCULAR | Status: DC | PRN

## 2022-06-15 MED ORDER — ONDANSETRON HCL 4 MG/2ML IJ SOLN
INTRAMUSCULAR | Status: DC | PRN
  Administered 2022-06-15: 4 mg via INTRAVENOUS

## 2022-06-15 MED ORDER — LIDOCAINE HCL (PF) 2 % IJ SOLN
INTRAMUSCULAR | Status: AC
  Filled 2022-06-15: qty 5

## 2022-06-15 MED ORDER — ACETAMINOPHEN 500 MG PO TABS
1000.0000 mg | ORAL_TABLET | Freq: Once | ORAL | Status: AC
  Administered 2022-06-15: 1000 mg via ORAL
  Filled 2022-06-15: qty 2

## 2022-06-15 MED ORDER — MIDAZOLAM HCL 2 MG/2ML IJ SOLN: 0.5000 mg | Freq: Once | INTRAMUSCULAR | Status: DC | PRN

## 2022-06-15 MED ORDER — SODIUM CHLORIDE 0.9 % IR SOLN
Status: DC | PRN
  Administered 2022-06-15: 6000 mL via INTRAVESICAL

## 2022-06-15 MED ORDER — PROMETHAZINE HCL 25 MG/ML IJ SOLN: 6.2500 mg | INTRAMUSCULAR | Status: DC | PRN

## 2022-06-15 MED ORDER — ROCURONIUM BROMIDE 10 MG/ML (PF) SYRINGE
PREFILLED_SYRINGE | INTRAVENOUS | Status: DC | PRN
  Administered 2022-06-15: 60 mg via INTRAVENOUS

## 2022-06-15 SURGICAL SUPPLY — 20 items
BAG DRN RND TRDRP ANRFLXCHMBR (UROLOGICAL SUPPLIES) ×1
BAG URINE DRAIN 2000ML AR STRL (UROLOGICAL SUPPLIES) IMPLANT
BAG URO CATCHER STRL LF (MISCELLANEOUS) ×1 IMPLANT
CATH FOLEY 2WAY SLVR 30CC 16FR (CATHETERS) IMPLANT
CATH URETL OPEN 5X70 (CATHETERS) IMPLANT
DRAPE FOOT SWITCH (DRAPES) ×1 IMPLANT
ELECT REM PT RETURN 15FT ADLT (MISCELLANEOUS) ×1 IMPLANT
GLOVE SURG LX STRL 7.5 STRW (GLOVE) ×1 IMPLANT
GOWN STRL REUS W/ TWL XL LVL3 (GOWN DISPOSABLE) ×1 IMPLANT
GOWN STRL REUS W/TWL XL LVL3 (GOWN DISPOSABLE) ×1
KIT TURNOVER KIT A (KITS) IMPLANT
LOOP CUT BIPOLAR 24F LRG (ELECTROSURGICAL) IMPLANT
MANIFOLD NEPTUNE II (INSTRUMENTS) ×1 IMPLANT
PACK CYSTO (CUSTOM PROCEDURE TRAY) ×1 IMPLANT
PENCIL SMOKE EVACUATOR (MISCELLANEOUS) IMPLANT
PLUG CATH AND CAP STER (CATHETERS) IMPLANT
SYR TOOMEY IRRIG 70ML (MISCELLANEOUS)
SYRINGE TOOMEY IRRIG 70ML (MISCELLANEOUS) IMPLANT
TUBING CONNECTING 10 (TUBING) ×1 IMPLANT
TUBING UROLOGY SET (TUBING) ×1 IMPLANT

## 2022-06-15 NOTE — Anesthesia Preprocedure Evaluation (Addendum)
Anesthesia Evaluation  Patient identified by MRN, date of birth, ID band Patient awake    Reviewed: Allergy & Precautions, NPO status , Patient's Chart, lab work & pertinent test results, reviewed documented beta blocker date and time   History of Anesthesia Complications Negative for: history of anesthetic complications  Airway Mallampati: I  TM Distance: >3 FB Neck ROM: Full    Dental  (+) Caps, Dental Advisory Given   Pulmonary sleep apnea and Continuous Positive Airway Pressure Ventilation ,    breath sounds clear to auscultation       Cardiovascular hypertension, Pt. on medications and Pt. on home beta blockers (-) angina Rhythm:Regular Rate:Normal     Neuro/Psych Anxiety negative neurological ROS     GI/Hepatic Neg liver ROS, SBO 05/2022   Endo/Other  BMI 33  Renal/GU negative Renal ROS   Prostate cancer    Musculoskeletal  (+) Arthritis ,   Abdominal (+) + obese,   Peds  Hematology negative hematology ROS (+)   Anesthesia Other Findings   Reproductive/Obstetrics                            Anesthesia Physical Anesthesia Plan  ASA: 3  Anesthesia Plan: General   Post-op Pain Management: Tylenol PO (pre-op)*   Induction: Intravenous  PONV Risk Score and Plan: 2 and Ondansetron and Dexamethasone  Airway Management Planned: Oral ETT  Additional Equipment: None  Intra-op Plan:   Post-operative Plan: Extubation in OR  Informed Consent: I have reviewed the patients History and Physical, chart, labs and discussed the procedure including the risks, benefits and alternatives for the proposed anesthesia with the patient or authorized representative who has indicated his/her understanding and acceptance.     Dental advisory given  Plan Discussed with: CRNA and Surgeon  Anesthesia Plan Comments:        Anesthesia Quick Evaluation

## 2022-06-15 NOTE — Anesthesia Postprocedure Evaluation (Signed)
Anesthesia Post Note  Patient: Troy [name]  Procedure(s) Performed: TRANSURETHRAL RESECTION OF BLADDER TUMOR (TURBT)/ CYSTOSCOPY/ BILATERAL RETROGRADE/     Patient location during evaluation: Phase II Anesthesia Type: General Level of consciousness: awake and alert, patient cooperative and oriented Pain management: pain level controlled Vital Signs Assessment: post-procedure vital signs reviewed and stable Respiratory status: spontaneous breathing, nonlabored ventilation and respiratory function stable Cardiovascular status: blood pressure returned to baseline and stable Postop Assessment: no apparent nausea or vomiting, able to ambulate and adequate PO intake Anesthetic complications: no   No notable events documented.  Last Vitals:  Vitals:   06/15/22 1215 06/15/22 1230  BP: (!) 114/53 (!) 149/82  Pulse: (!) 52 (!) 50  Resp: 15   Temp:    SpO2: 96% 94%    Last Pain:  Vitals:   06/15/22 1230  TempSrc:   PainSc: 0-No pain                 Shaena Parkerson,E. Ania Levay

## 2022-06-15 NOTE — Interval H&P Note (Signed)
History and Physical Interval Note:  06/15/2022 8:12 AM  Troy [name]  has presented today for surgery, with the diagnosis of BLADDER TUMOR.  The various methods of treatment have been discussed with the patient and family. After consideration of risks, benefits and other options for treatment, the patient has consented to  Procedure(s) with comments: TRANSURETHRAL RESECTION OF BLADDER TUMOR (TURBT)/ CYSTOSCOPY/ BILATERAL RETROGRADE/ (N/A) - GENERAL ANESTHESIA WITH PARALYSIS as a surgical intervention.  The patient's history has been reviewed, patient examined, no change in status, stable for surgery.  I have reviewed the patient's chart and labs.  Questions were answered to the patient's satisfaction.     [employer]

## 2022-06-15 NOTE — Transfer of Care (Signed)
Immediate Anesthesia Transfer of Care Note  Patient: Troy [name]  Procedure(s) Performed: TRANSURETHRAL RESECTION OF BLADDER TUMOR (TURBT)/ CYSTOSCOPY/ BILATERAL RETROGRADE/  Patient Location: PACU  Anesthesia Type:General  Level of Consciousness: awake, alert  and oriented  Airway & Oxygen Therapy: Patient Spontanous Breathing and Patient connected to face mask oxygen  Post-op Assessment: Report given to RN and Post -op Vital signs reviewed and stable  Post vital signs: Reviewed and stable  Last Vitals:  Vitals Value Taken Time  BP 140/77 06/15/22 1035  Temp    Pulse 55 06/15/22 1037  Resp    SpO2 100 % 06/15/22 1037  Vitals shown include unvalidated device data.  Last Pain:  Vitals:   06/15/22 0828  TempSrc:   PainSc: 0-No pain         Complications: No notable events documented.

## 2022-06-15 NOTE — Anesthesia Procedure Notes (Signed)
Procedure Name: Intubation Date/Time: 06/15/2022 9:53 AM  Performed by: Syris Brookens D, CRNAPre-anesthesia Checklist: Patient identified, Emergency Drugs available, Suction available and Patient being monitored Patient Re-evaluated:Patient Re-evaluated prior to induction Oxygen Delivery Method: Circle system utilized Preoxygenation: Pre-oxygenation with 100% oxygen Induction Type: IV induction Ventilation: Mask ventilation without difficulty Laryngoscope Size: Mac and 4 Grade View: Grade I Tube type: Oral Tube size: 7.5 mm Number of attempts: 1 Airway Equipment and Method: Stylet and Oral airway Placement Confirmation: ETT inserted through vocal cords under direct vision, positive ETCO2 and breath sounds checked- equal and bilateral Tube secured with: Tape Dental Injury: Teeth and Oropharynx as per pre-operative assessment

## 2022-06-15 NOTE — Op Note (Addendum)
Preoperative diagnosis: Bladder tumor (2 cm)  Postoperative diagnosis:  Bladder tumor (2 cm)  Procedure:  Cystoscopy Transurethral resection of bladder tumor (2 cm) Bilateral retrograde pyelography with interpretation Postoperative intravesical instillation of gemcitabine  Surgeon: Pryor Curia. M.D.  Anesthesia: General  Complications: None  Intraoperative findings:  Bladder tumor: 2 cm papillary tumor just beyond the left ureteral orifice Retrograde pyelography: Bilateral retrograde pyelography was performed with a 6 French ureteral catheter and Omnipaque contrast.  This revealed a normal caliber left ureter without filling defects.  No hydronephrosis or left renal pelvic filling defects were noted.  Normal caliber right ureter without filling defects.  No hydronephrosis or right renal pelvic filling defects were noted.  EBL: Minimal  Specimens: Left-sided bladder tumor Base of bladder tumor  Disposition of specimens: Pathology  Indication: Troy [name] is a patient who was found to have a bladder tumor. After reviewing the management options for treatment, he elected to proceed with the above surgical procedure(s). We have discussed the potential benefits and risks of the procedure, side effects of the proposed treatment, the likelihood of the patient achieving the goals of the procedure, and any potential problems that might occur during the procedure or recuperation. Informed consent has been obtained.  Description of procedure:  The patient was taken to the operating room and general anesthesia was induced.  The patient was placed in the dorsal lithotomy position, prepped and draped in the usual sterile fashion, and preoperative antibiotics were administered. A preoperative time-out was performed.   Cystourethroscopy was performed.  The patient's urethra was examined and was normal.   The bladder was then systematically examined in its entirety.  There was a  solitary papillary 2 cm bladder tumor located just beyond the left ureteral orifice on the left side.  Attention then turned to the left ureteral orifice and a ureteral catheter was used to intubate the ureteral orifice.  Omnipaque contrast was injected through the ureteral catheter and a retrograde pyelogram was performed with findings as dictated above.  Attention then turned to the right ureteral orifice and a ureteral catheter was used to intubate the ureteral orifice.  Omnipaque contrast was injected through the ureteral catheter and a retrograde pyelogram was performed with findings as dictated above.  The bladder was then re-examined after the resectoscope was placed.  The bladder tumor was 2 cm.  It was located just beyond the left ureteral orifice and appeared papillary. Using loop cautery resection, the entire tumor was resected and removed for permanent pathologic analysis.   Separate biopsies were also taken of the underlying deep bladder tissue and sent as a separate specimen.   Hemostasis was then achieved with the loop cautery and the bladder was emptied and reinspected with no further bleeding noted at the end of the procedure.    The bladder was then emptied and the procedure ended.  The patient appeared to tolerate the procedure well and without complications.  The patient was able to be awakened and transferred to the recovery unit in satisfactory condition.   A 16 French Foley catheter was left indwelling.  In the PACU, 2 g of intravesical gemcitabine was instilled and left indwelling for 1 hour.   Pryor Curia MD

## 2022-06-15 NOTE — Discharge Instructions (Addendum)
You may see some blood in the urine and may have some burning with urination for 48-72 hours. You also may notice that you have to urinate more frequently or urgently after your procedure which is normal.  You should call should you develop an inability urinate, fever > 101, persistent nausea and vomiting that prevents you from eating or drinking to stay hydrated.    

## 2022-06-16 ENCOUNTER — Encounter (HOSPITAL_COMMUNITY): Payer: Self-pay | Admitting: Urology

## 2022-06-17 LAB — SURGICAL PATHOLOGY

## 2022-06-25 ENCOUNTER — Ambulatory Visit
Admission: RE | Admit: 2022-06-25 | Discharge: 2022-06-25 | Disposition: A | Discharge status: Home / Self Care | Payer: Medicare Other | Source: Ambulatory Visit | Attending: Urology | Admitting: Urology

## 2022-06-25 DIAGNOSIS — D49511 Neoplasm of unspecified behavior of right kidney: Secondary | ICD-10-CM

## 2022-06-25 MED ORDER — GADOPICLENOL 0.5 MMOL/ML IV SOLN
10.0000 mL | Freq: Once | INTRAVENOUS | Status: AC | PRN
  Administered 2022-06-25: 10 mL via INTRAVENOUS

## 2022-06-28 ENCOUNTER — Other Ambulatory Visit: Payer: Self-pay | Admitting: Urology

## 2022-06-28 DIAGNOSIS — N2889 Other specified disorders of kidney and ureter: Secondary | ICD-10-CM

## 2022-07-13 ENCOUNTER — Ambulatory Visit
Admission: RE | Admit: 2022-07-13 | Discharge: 2022-07-13 | Disposition: A | Discharge status: Home / Self Care | Payer: Medicare Other | Source: Ambulatory Visit | Attending: Urology | Admitting: Urology

## 2022-07-13 DIAGNOSIS — N2889 Other specified disorders of kidney and ureter: Secondary | ICD-10-CM

## 2022-07-13 HISTORY — PX: IR RADIOLOGIST EVAL & MGMT: IMG5224

## 2022-07-13 NOTE — Consult Note (Signed)
Chief Complaint: Right sided renal lesion  Referring Physician(s): Borden,Lester  History of Present Illness: Troy [name] is a 76 y.o. male with past medical history significant for hypertension, anemia, sleep apnea and prostate cancer who was found to have an approximately 1.7 x 1.2 cm partially exophytic enhancing lesion arising from the posterior aspect of the inferior pole of the right kidney on abdominal CT performed 05/24/2022 performed for left lower quadrant abdominal pain at which time the patient was noted to have a high-grade small bowel obstruction (for which the patient ultimately underwent partial small bowel resection and lysis of adhesions).  Additionally, patient was found to have an approximate 1.8 cm enhancing lesion within the left side of the urinary bladder (which was been subsequently removed by Dr. Alinda Money).    The renal lesion was subsequently evaluated on dedicated abdominal MRI performed 06/25/2022, following resolution of patient's obstructive symptoms.  The abdominal MRI confirmed the presence of an approximately 1.7 x 1.5 cm enhancing lesion within the posterior inferior pole of the right kidney compatible with a renal cell carcinoma.    No additional worrisome lesions were identified.  Right-sided renal vein was found to be widely patent.  No regional lymphadenopathy.  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  The patient has recovered well from all of the able and is seen today at the IR clinic for evaluation for percutaneous management of the remaining right sided renal lesion.   The patient remains asymptomatic in regards to this until he discovered renal lesion.  Specifically, no flank pain, hematuria or change in appetite or energy level.  Past Medical History:  Diagnosis Date   Abdominal  distention    hernia   Anemia    Arthritis    big toes    Cancer (HCC)    prostate    Hypertension    Lumbar herniated disc    L5   Rash    yeast infection from bladder sling surgery    Sciatica of right side 09/01/2011   right leg-tx. Tylenol   Sleep apnea    cpap    Past Surgical History:  Procedure Laterality Date   HERNIA REPAIR  September 01, 2010   INCONTINENCE SURGERY  Jan 31, 2011   INSERTION OF MESH  03/10/2015   Procedure: INSERTION OF MESH;  Surgeon: Johnathan Hausen, MD;  Location: [facility];  Service: General;;   IR RADIOLOGIST EVAL & MGMT  05/15/2018   LAPAROSCOPIC NISSEN FUNDOPLICATION N/A 5/80/9983   Procedure: LAPAROSCOPIC NISSEN FUNDOPLICATION AND HIATAL HERNIA REPAIR;  Surgeon: Johnathan Hausen, MD;  Location: [facility];  Service: General;  Laterality: N/A;   LAPAROTOMY N/A 05/29/2022   Procedure: laparoscopic assisted small bowel resection with anastamosis;  Surgeon: Kieth Brightly, Arta Bruce, MD;  Location: [facility];  Service: General;  Laterality: N/A;   PROSTATECTOMY  reb 9, 2010   TONSILLECTOMY     TRANSURETHRAL RESECTION OF BLADDER TUMOR N/A 06/15/2022   Procedure: TRANSURETHRAL RESECTION OF BLADDER TUMOR (TURBT)/ CYSTOSCOPY/ BILATERAL RETROGRADE/;  Surgeon: Raynelle Bring, MD;  Location: [facility];  Service: Urology;  Laterality: N/A;  GENERAL ANESTHESIA WITH PARALYSIS   UPPER GI ENDOSCOPY N/A 03/10/2015   Procedure: UPPER GI ENDOSCOPY;  Surgeon: Johnathan Hausen, MD;  Location: [facility];  Service: General;  Laterality: N/A;   VENTRAL HERNIA REPAIR  09/06/2011  Procedure: HERNIA REPAIR VENTRAL ADULT;  Surgeon: Pedro Earls, MD;  Location: [facility];  Service: General;  Laterality: N/A;   VENTRAL HERNIA REPAIR  09/06/2011   Procedure: LAPAROSCOPIC VENTRAL HERNIA;  Surgeon: Pedro Earls, MD;  Location: [facility];  Service: General;  Laterality: N/A;    Allergies: Ciprofloxacin and Wound dressings [skin protectants, misc.]  Medications: Prior to Admission medications    Medication Sig Start Date End Date Taking? Authorizing Provider  ALPRAZolam Duanne Moron) 0.25 MG tablet Take 0.25 mg by mouth at bedtime as needed for anxiety or sleep.    [provider]  amLODipine (NORVASC) 10 MG tablet Take 10 mg by mouth daily.    [provider]  Cholecalciferol (VITAMIN D3) 2000 UNITS TABS Take 2,000 Units by mouth every morning.    [provider]  ezetimibe (ZETIA) 10 MG tablet Take 10 mg by mouth at bedtime.    [provider]  hydrochlorothiazide (HYDRODIURIL) 25 MG tablet Take 25 mg by mouth daily. 05/19/22   [provider]  hydrocortisone cream 1 % Apply 1 Application topically daily as needed for itching.    [provider]  loratadine (CLARITIN) 10 MG tablet Take 10 mg by mouth daily. Started on 06/06/22    [provider]  losartan (COZAAR) 100 MG tablet Take 100 mg by mouth every morning.    [provider]  LUTEIN PO Take 1 tablet by mouth daily.    [provider]  metoprolol succinate (TOPROL-XL) 50 MG 24 hr tablet Take 50 mg by mouth daily. Take with or immediately following a meal.    [provider]  NON FORMULARY Pt uses a cpap nightly    [provider]  OVER THE COUNTER MEDICATION Sleep Aid Doxylamine Succinate 25 mg as needed for sleep    [provider]  phenazopyridine (PYRIDIUM) 200 MG tablet Take 1 tablet (200 mg total) by mouth 3 (three) times daily as needed for pain. 06/15/22   Raynelle Bring, MD  rosuvastatin (CRESTOR) 20 MG tablet Take 20 mg by mouth at bedtime.    [provider]  triamcinolone cream (KENALOG) 0.1 % Apply 1 Application topically 2 (two) times daily. Two times daily started on 06/06/22 prescribed by surgeon office per pt    [provider]  Turmeric (QC TUMERIC COMPLEX PO) Take 1 capsule by mouth daily.    [provider]     Family History  Problem Relation Age of Onset   Lung cancer Mother     Coronary artery disease Mother    Stroke Father    Diabetes Father    Bladder Cancer Father    Dementia Father    Cervical cancer Sister     Social History   Socioeconomic History   Marital status: Married    Spouse name: Not on file   Number of children: Not on file   Years of education: Not on file   Highest education level: Not on file  Occupational History   Not on file  Tobacco Use   Smoking status: Never   Smokeless tobacco: Never  Vaping Use   Vaping Use: Never used  Substance and Sexual Activity   Alcohol use: Yes    Comment: occ. alcohol   Drug use: No   Sexual activity: Yes  Other Topics Concern   Not on file  Social History Narrative   Not on file   Social Determinants of Health   Financial Resource Strain: Not on  file  Food Insecurity: No Food Insecurity (05/24/2022)   Hunger Vital Sign    Worried About Running Out of Food in the Last Year: Never true    Ran Out of Food in the Last Year: Never true  Transportation Needs: No Transportation Needs (05/24/2022)   PRAPARE - Hydrologist (Medical): No    Lack of Transportation (Non-Medical): No  Physical Activity: Not on file  Stress: Not on file  Social Connections: Not on file    ECOG Status: 0 - Asymptomatic  Review of Systems: A 12 point ROS discussed and pertinent positives are indicated in the HPI above.  All other systems are negative.  Review of Systems  Vital Signs: There were no vitals taken for this visit.   Physical Exam  Mallampati Score:   Imaging:  The following examinations were reviewed in detail:  Abdominal MRI - 06/25/2022. CT abdomen pelvis - 05/24/2022; 06/19/2018  MR ABDOMEN WWO CONTRAST  Result Date: 06/26/2022 CLINICAL DATA:  Neoplasm of right kidney EXAM: MRI ABDOMEN WITHOUT AND WITH CONTRAST TECHNIQUE: Multiplanar multisequence MR imaging of the abdomen was performed both before and after the administration of intravenous contrast.  CONTRAST:  10 mL Vueway gadolinium contrast IV COMPARISON:  Suspected right renal mass incidentally identified by prior CT FINDINGS: Lower chest: No acute abnormality. Hepatobiliary: No solid liver abnormality is seen. There is amorphous, geographic early arterial hyperenhancement of the right lobe of the liver, centered in hepatic segment VI/VII, unchanged compared to prior examination dated 2019, measuring approximately 5.6 x 2.8 cm (series 11, image 32). This is isoenhancing to parenchyma on subsequent contrast phases and there is no underlying intrinsic signal abnormality. Hepatic steatosis. Sludge and small gallstones in the gallbladder. No gallbladder wall thickening or biliary ductal dilatation. Pancreas: Unremarkable. No pancreatic ductal dilatation or surrounding inflammatory changes. Spleen: Normal in size without significant abnormality. Adrenals/Urinary Tract: Adrenal glands are unremarkable. Exophytic, contrast enhancing mass of the posterior inferior pole of the right kidney measuring 1.7 x 1.5 cm (series 11, image 66). Additional simple, benign bilateral renal cortical cysts, for which no specific further follow-up or characterization is required. Stomach/Bowel: Stomach is within normal limits. No evidence of bowel wall thickening, distention, or inflammatory changes. Colonic diverticulosis. Vascular/Lymphatic: No significant vascular findings are present. No enlarged abdominal lymph nodes. Other: No abdominal wall hernia or abnormality. No ascites. Musculoskeletal: No acute or significant osseous findings. IMPRESSION: 1. Exophytic, contrast enhancing mass of the posterior inferior pole of the right kidney measuring 1.7 x 1.5 cm, consistent with a small renal cell carcinoma. 2. No evidence of renal vein invasion, lymphadenopathy, or metastatic disease in the abdomen. 3. Amorphous, geographic early arterial hyperenhancement of the right lobe of the liver, centered in hepatic segment VI/VII, unchanged  compared to prior examination dated 2019. This is consistent with a benign perfusion variation and no specific further follow-up or characterization is required. 4. Hepatic steatosis. 5. Cholelithiasis. 6. Colonic diverticulosis. Electronically Signed   By: Delanna Ahmadi M.D.   On: 06/26/2022 20:24   DG C-Arm 1-60 Min-No Report  Result Date: 06/15/2022 Fluoroscopy was utilized by the requesting physician.  No radiographic interpretation.    Labs:  CBC: Recent Labs    05/27/22 0553 05/29/22 0458 05/30/22 0251 06/07/22 0840  WBC 4.5 5.8 8.1 8.2  HGB 13.6 13.1 12.0* 13.6  HCT 40.8 39.1 35.6* 41.2  PLT 249 267 284 393    COAGS: No results for input(s): "INR", "APTT" in the last 8760  hours.  BMP: Recent Labs    05/29/22 0458 05/29/22 1800 05/30/22 0251 05/31/22 0433 06/07/22 0840  NA 142  --  137 139 140  K 3.5 3.9 4.0 3.6 3.8  CL 109  --  107 109 108  CO2 27  --  '26 25 27  '$ GLUCOSE 106*  --  167* 129* 121*  BUN 22  --  '21 19 19  '$ CALCIUM 8.2*  --  7.8* 8.0* 9.0  CREATININE 0.91  --  0.68 0.75 1.01  GFRNONAA >60  --  >60 >60 >60    LIVER FUNCTION TESTS: Recent Labs    05/24/22 0917 05/25/22 0534 05/30/22 0251 05/31/22 0433  BILITOT 1.5* 1.1 0.6 0.7  AST 14* '15 15 23  '$ ALT '17 15 17 22  '$ ALKPHOS 70 54 47 47  PROT 7.8 6.3* 5.5* 5.5*  ALBUMIN 4.8 3.7 2.7* 2.8*    TUMOR MARKERS: No results for input(s): "AFPTM", "CEA", "CA199", "CHROMGRNA" in the last 8760 hours.  Assessment and Plan:  Troy [name] is a 76 y.o. male with past medical history significant for hypertension, anemia, sleep apnea and prostate cancer who was found to have an approximately 1.7 x 1.2 cm partially exophytic enhancing lesion arising from the posterior aspect of the inferior pole of the right kidney on abdominal CT performed 05/24/2022, subsequently evaluated on dedicated abdominal MRI performed 06/25/2022, following resolution of patient's obstructive symptoms.  The abdominal MRI confirmed the  presence of an approximately 1.7 x 1.5 cm enhancing lesion within the posterior inferior pole of the right kidney compatible with a renal cell carcinoma.    The patient has recovered well from both his partial small bowel resection and bladder tumor resection.  The patient remains asymptomatic in regards to this until he discovered renal lesion.  Specifically, no flank pain, hematuria or change in appetite or energy level.  The following examinations were personally reviewed in detail with the patient: Abdominal MRI - 06/25/2022. CT abdomen pelvis - 05/24/2022; 06/19/2018  No additional worrisome lesions were identified.  Right-sided renal vein was found to be widely patent.  No regional lymphadenopathy.  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  I explained to the patient that given the appearance of the enhancing right-sided renal lesion, which is new compared to abdominal CT performed 06/2018, the lesion is compatible with a renal cell carcinoma and does NOT need to undergo biopsy for confirmation as any biopsy result besides RCC would be attributable to a sampling error.    Potential each treatment options were discussed at length with the patient including continued conservative management (presently, the growth rate is unknown however the lesion is new compared to the 06/2018 examination therefore estimated to be at least 3-4 mm/year), resection (likely with a partial nephrectomy) versus image guided cryoablation.    I explained that the size (less than 3 cm) and location (exophytic lesion about the posterior inferior pole of the right kidney), renders this lesion an ideal target for cryoablation.   I explained that the cryoablation is performed with general anesthesia at [facility] and using a combination of CT and ideally US  guidance, 1 or 2 ablation probes are utilized to treat the lesion.  Note, given the small size of the lesion, I feel that it would be unlikely to obtain a biopsy at the  time of the cryoablation.    I explained that while the procedure is performed with curative intent, as surgical margins are not obtained, the patient will be followed with surveillance examinations every 3 months for the first year followed by every 6 months for the second year and a final examination 3 years following the cryoablation to ensure complete resolution.  I explained the procedure typically entails an overnight admission to the hospital for continued observation and PCA usage.  The expected recovery from the procedure is typically in the range about 7 to 10 days and while during recovery he may experience flank pain and hematuria, the cryoablation is typically well-tolerated by most patients.  Additional procedure related complications include worsening renal function, injury to adjacent organ, bleeding/hematuria and/or infection.  Following this prolonged and detailed conversation with the patient was to proceed with image guided right-sided renal cryoablation.  As such, the schedule will proceed will be scheduled with general anesthesia at [facility] at the next available date.  The patient knows to call the interventional radiology clinic with any interval questions or concerns.  Plan: - Arrange for patient to undergo right-sided renal cryoablation with general anesthesia at was in the hospital.  The procedure will entail an overnight admission for continued observation and PCA usage.    Thank you for this interesting consult.  I greatly enjoyed meeting Troy [name] and look forward to participating in their care.  A copy of this report was sent to the requesting provider on this date.  Electronically Signed: Sandi Mariscal 07/13/2022, 12:15 PM   I spent a total of 30 Minutes in face to face in clinical  consultation, greater than 50% of which was counseling/coordinating care for right sided RCC.

## 2022-07-19 ENCOUNTER — Other Ambulatory Visit (HOSPITAL_COMMUNITY): Payer: Self-pay | Admitting: Interventional Radiology

## 2022-07-19 DIAGNOSIS — N2889 Other specified disorders of kidney and ureter: Secondary | ICD-10-CM

## 2022-07-20 ENCOUNTER — Encounter (HOSPITAL_COMMUNITY): Payer: Self-pay | Admitting: Interventional Radiology

## 2022-08-23 ENCOUNTER — Other Ambulatory Visit: Payer: Self-pay | Admitting: Radiology

## 2022-08-23 DIAGNOSIS — N2889 Other specified disorders of kidney and ureter: Secondary | ICD-10-CM

## 2022-08-23 NOTE — Progress Notes (Signed)
Left a voicemail on IR P.A. Line to request pre op orders.

## 2022-08-23 NOTE — Progress Notes (Addendum)
COVID Vaccine received:  _0  No _1  Yes Date of any COVID positive Test in last 90 days: none  PCP - Derinda Late, MD  Cardiologist - None  Chest x-ray - 08-25-2022  EKG -  06-15-2022 Stress Test -  ECHO -  Cardiac Cath -   PCR screen: _2  Ordered & Completed                      _3   No Order but Needs PROFEND                      _4   N/A for this surgery  Surgery Plan:  _5  Ambulatory                            _6  Outpatient in bed                            _7  Admit  Anesthesia:    _8  General  _9  Spinal                           _10   Choice _11   MAC  Pacemaker / ICD device _12  No _13  Yes        Device order form faxed _14  No    _15   Yes      Faxed to:  Spinal Cord Stimulator:_16  No _17  Yes      (Remind patient to bring remote DOS) Other Implants:   History of Sleep Apnea? _18  No _19  Yes   CPAP used?- _20  No _21  Yes   Patient refuses to bring in his Mask and Tubing. He says he is going home and won't need it.   Does the patient monitor blood sugar? _22  No _23  Yes  _24  N/A  Blood Thinner / Instructions: none Aspirin Instructions: None  ERAS Protocol Ordered: _25  No  _26  Yes PRE-SURGERY _27  ENSURE  _28  G2   Patient is to be NPO after: Midnight Prior  Comments: No time of arrival on the appt booking so patient was instructed to be here at 06:30 am  Activity level: Patient can climb a flight of stairs without difficulty; _29  No CP  _30  No SOB, but would have leg pain. Patient can perform ADLs without assistance.   Anesthesia review: HTN, OSA (CPAP), Prostate Ca.  Patient denies shortness of breath, fever, cough and chest pain at PAT appointment.  Patient verbalized understanding and agreement to the Pre-Surgical Instructions that were given to them at this PAT appointment. Patient was also educated of the need to review these PAT instructions again prior to his/her surgery.I reviewed the appropriate phone numbers to call if they have any and questions or concerns.

## 2022-08-25 ENCOUNTER — Other Ambulatory Visit: Payer: Self-pay

## 2022-08-25 ENCOUNTER — Encounter (HOSPITAL_COMMUNITY): Payer: Self-pay

## 2022-08-25 ENCOUNTER — Encounter (HOSPITAL_COMMUNITY)
Admission: RE | Admit: 2022-08-25 | Discharge: 2022-08-25 | Disposition: A | Discharge status: Home / Self Care | Payer: Medicare Other | Source: Ambulatory Visit | Attending: Interventional Radiology | Admitting: Interventional Radiology

## 2022-08-25 ENCOUNTER — Ambulatory Visit (HOSPITAL_COMMUNITY)
Admission: RE | Admit: 2022-08-25 | Discharge: 2022-08-25 | Disposition: A | Discharge status: Home / Self Care | Payer: Medicare Other | Source: Ambulatory Visit | Attending: Radiology | Admitting: Radiology

## 2022-08-25 DIAGNOSIS — Z01818 Encounter for other preprocedural examination: Secondary | ICD-10-CM | POA: Diagnosis present

## 2022-08-25 DIAGNOSIS — N2889 Other specified disorders of kidney and ureter: Secondary | ICD-10-CM | POA: Diagnosis not present

## 2022-08-25 HISTORY — DX: Pneumonia, unspecified organism: J18.9

## 2022-08-25 HISTORY — DX: Chronic kidney disease, unspecified: N18.9

## 2022-08-25 LAB — CBC WITH DIFFERENTIAL/PLATELET
Abs Immature Granulocytes: 0.02 10*3/uL (ref 0.00–0.07)
Basophils Absolute: 0 10*3/uL (ref 0.0–0.1)
Basophils Relative: 1 %
Eosinophils Absolute: 0.2 10*3/uL (ref 0.0–0.5)
Eosinophils Relative: 2 %
HCT: 42.5 % (ref 39.0–52.0)
Hemoglobin: 14.1 g/dL (ref 13.0–17.0)
Immature Granulocytes: 0 %
Lymphocytes Relative: 18 %
Lymphs Abs: 1.1 10*3/uL (ref 0.7–4.0)
MCH: 30.7 pg (ref 26.0–34.0)
MCHC: 33.2 g/dL (ref 30.0–36.0)
MCV: 92.4 fL (ref 80.0–100.0)
Monocytes Absolute: 0.5 10*3/uL (ref 0.1–1.0)
Monocytes Relative: 8 %
Neutro Abs: 4.4 10*3/uL (ref 1.7–7.7)
Neutrophils Relative %: 71 %
Platelets: 285 10*3/uL (ref 150–400)
RBC: 4.6 MIL/uL (ref 4.22–5.81)
RDW: 12.5 % (ref 11.5–15.5)
WBC: 6.2 10*3/uL (ref 4.0–10.5)
nRBC: 0 % (ref 0.0–0.2)

## 2022-08-25 LAB — BASIC METABOLIC PANEL
Anion gap: 4 — ABNORMAL LOW (ref 5–15)
BUN: 20 mg/dL (ref 8–23)
CO2: 29 mmol/L (ref 22–32)
Calcium: 8.9 mg/dL (ref 8.9–10.3)
Chloride: 108 mmol/L (ref 98–111)
Creatinine, Ser: 0.99 mg/dL (ref 0.61–1.24)
GFR, Estimated: >60 mL/min (ref >60)
Glucose, Bld: 107 mg/dL — ABNORMAL HIGH (ref 70–99)
Potassium: 3.7 mmol/L (ref 3.5–5.1)
Sodium: 141 mmol/L (ref 135–145)

## 2022-08-25 LAB — PROTIME-INR
INR: 1.1 (ref 0.8–1.2)
Prothrombin Time: 13.7 seconds (ref 11.4–15.2)

## 2022-08-25 NOTE — Patient Instructions (Signed)
SURGICAL WAITING ROOM VISITATION Patients having surgery or a procedure may have no more than 2 support people in the waiting area - these visitors may rotate in the visitor waiting room.   Children under the age of 25 must have an adult with them who is not the patient. If the patient needs to stay at the hospital during part of their recovery, the visitor guidelines for inpatient rooms apply.  PRE-OP VISITATION  Pre-op nurse will coordinate an appropriate time for 1 support person to accompany the patient in pre-op.  This support person may not rotate.  This visitor will be contacted when the time is appropriate for the visitor to come back in the pre-op area.  Please refer to the [facility] website for the visitor guidelines for Inpatients (after your surgery is over and you are in a regular room).  You are not required to quarantine at this time prior to your surgery. However, you must do this: Hand Hygiene often Do NOT share personal items Notify your provider if you are in close contact with someone who has COVID or you develop fever 100.4 or greater, new onset of sneezing, cough, sore throat, shortness of breath or body aches.  If you test positive for Covid or have been in contact with anyone that has tested positive in the last 10 days please notify you surgeon.    Your procedure is scheduled on:  Wednesday  August 30, 2022  Report to [facility] Main Entrance: [place] entrance where the [employer] is available.   Report to admitting at:  06:30   AM  +++++Call this number if you have any questions or problems the morning of surgery [phone]  DO NOT EAT OR DRINK ANYTHING AFTER MIDNIGHT THE NIGHT PRIOR TO YOUR SURGERY / PROCEDURE.   FOLLOW  ANY ADDITIONAL PRE OP INSTRUCTIONS YOU RECEIVED FROM YOUR SURGEON'S OFFICE!!!   Oral Hygiene is also important to reduce your risk of infection.        Remember - BRUSH YOUR TEETH THE MORNING OF SURGERY WITH YOUR REGULAR  TOOTHPASTE  Do NOT smoke after Midnight the night before surgery.  Take ONLY these medicines the morning of surgery with A SIP OF WATER: Metoprolol. You may use your Visine eye drops and take Claritin if needed.                     You may not have any metal on your body including  jewelry, and body piercing  Do not wear  lotions, powders, perfumes or deodorant  Men may shave face and neck.  Contacts, Hearing Aids, dentures or bridgework may not be worn into surgery. DENTURES WILL BE REMOVED PRIOR TO SURGERY PLEASE DO NOT APPLY "Poly grip" OR ADHESIVES!!!  You may bring a small overnight bag with you on the day of surgery, only pack items that are not valuable .[facility] IS NOT RESPONSIBLE   FOR VALUABLES THAT ARE LOST OR STOLEN.   Do not bring your home medications to the hospital. The Pharmacy will dispense medications listed on your medication list to you during your admission in the Hospital.  Special Instructions: Bring a copy of your healthcare power of attorney and living will documents the day of surgery, if you wish to have them scanned into your [facility] Records- EPIC  Please read over the following fact sheets you were given: IF YOU HAVE QUESTIONS ABOUT YOUR PRE-OP INSTRUCTIONS, PLEASE CALL [phone]  (KAY)   [facility] - Preparing for Surgery Before surgery, you can play an important role.  Because skin is not sterile, your skin needs to be as free of germs as possible.  You can reduce the number of germs on your skin by washing with CHG (chlorahexidine gluconate) soap before surgery.  CHG is an antiseptic cleaner which kills germs and bonds with the skin to continue killing germs even after washing. Please DO NOT use if you have an allergy to CHG or antibacterial soaps.  If your skin becomes reddened/irritated stop using the CHG and inform your nurse when you arrive at Short Stay. Do not shave (including legs and underarms) for at least 48 hours prior to the  first CHG shower.  You may shave your face/neck.  Please follow these instructions carefully:  1.  Shower with CHG Soap the night before surgery and the  morning of surgery.  2.  If you choose to wash your hair, wash your hair first as usual with your normal  shampoo.  3.  After you shampoo, rinse your hair and body thoroughly to remove the shampoo.                             4.  Use CHG as you would any other liquid soap.  You can apply chg directly to the skin and wash.  Gently with a scrungie or clean washcloth.  5.  Apply the CHG Soap to your body ONLY FROM THE NECK DOWN.   Do not use on face/ open                           Wound or open sores. Avoid contact with eyes, ears mouth and genitals (private parts).                       Wash face,  Genitals (private parts) with your normal soap.             6.  Wash thoroughly, paying special attention to the area where your  surgery  will be performed.  7.  Thoroughly rinse your body with warm water from the neck down.  8.  DO NOT shower/wash with your normal soap after using and rinsing off the CHG Soap.            9.  Pat yourself dry with a clean towel.            10.  Wear clean pajamas.            11.  Place clean sheets on your bed the night of your first shower and do not  sleep with pets.  ON THE DAY OF SURGERY : Do not apply any lotions/deodorants the morning of surgery.  Please wear clean clothes to the hospital/surgery center.    FAILURE TO FOLLOW THESE INSTRUCTIONS MAY RESULT IN THE CANCELLATION OF YOUR SURGERY  PATIENT SIGNATURE_________________________________  NURSE SIGNATURE__________________________________  ________________________________________________________________________

## 2022-08-29 ENCOUNTER — Other Ambulatory Visit: Payer: Self-pay | Admitting: Student

## 2022-08-29 DIAGNOSIS — N2889 Other specified disorders of kidney and ureter: Secondary | ICD-10-CM

## 2022-08-29 NOTE — H&P (Incomplete)
  The note originally documented on this encounter has been moved the the encounter in which it belongs.  

## 2022-08-29 NOTE — H&P (Signed)
Chief Complaint: Patient was seen in consultation today for right renal lesion at the request of [place]   Referring Physician(s): Borden,Lester   Supervising Physician: Sandi Mariscal  Patient Status: [facility] - Out-pt  History of Present Illness: Troy [name] is a 76 y.o. male with PMHs of hypertension, anemia, sleep apnea and prostate cancer and recent finding of right renal lesion who presents for cryoablation of the renal lesion today.   Patient was referred to Dr. Pascal Lux by his urologist to discuss about treatment options for the right renal lesion which was first found in CT in September 2023. Subsequent MRI confirmed the lesion within the posterior inferior pole of the right kidney compatible with a renal cell carcinoma. A CT guided cryoablation was recommended to the patient, and after thorough discussion and shared decision making, patient decided to proceed with the ablation.   He presents to [facility] IR today.  Patient laying in bed, not in acute distress.  Denise headache, fever, chills, shortness of breath, cough, chest pain, abdominal pain, nausea ,vomiting, and bleeding, no symptoms of UTI.  Patient asks if he has to stay in the hospital overnight.  Informed the patient it is recommended but there had been patients who were discharged by end of the day in the past. Informed the patient that IR will follow up in the last afternoon to see if patient is ready to be discharged.  He verbalized understanding.   Past Medical History:  Diagnosis Date   Abdominal distention    hernia   Anemia    Arthritis    big toes    Cancer (HCC)    prostate    Chronic kidney disease    Hypertension    Lumbar herniated disc    L5   Pneumonia    Rash    yeast infection from bladder sling surgery    Sciatica of right side 09/01/2011   right leg-tx. Tylenol   Sleep apnea    cpap    Past Surgical History:  Procedure Laterality Date   HERNIA REPAIR  September 01, 2010    INCONTINENCE SURGERY  Jan 31, 2011   INSERTION OF MESH  03/10/2015   Procedure: INSERTION OF MESH;  Surgeon: Johnathan Hausen, MD;  Location: [facility] ORS;  Service: General;;   IR RADIOLOGIST EVAL & MGMT  05/15/2018   IR RADIOLOGIST EVAL & MGMT  07/13/2022   LAPAROSCOPIC NISSEN FUNDOPLICATION N/A 2/95/6213   Procedure: LAPAROSCOPIC NISSEN FUNDOPLICATION AND HIATAL HERNIA REPAIR;  Surgeon: Johnathan Hausen, MD;  Location: [facility];  Service: General;  Laterality: N/A;   LAPAROTOMY N/A 05/29/2022   Procedure: laparoscopic assisted small bowel resection with anastamosis;  Surgeon: Kieth Brightly, Arta Bruce, MD;  Location: [facility];  Service: General;  Laterality: N/A;   PROSTATECTOMY  reb 9, 2010   TONSILLECTOMY     TRANSURETHRAL RESECTION OF BLADDER TUMOR N/A 06/15/2022   Procedure: TRANSURETHRAL RESECTION OF BLADDER TUMOR (TURBT)/ CYSTOSCOPY/ BILATERAL RETROGRADE/;  Surgeon: Raynelle Bring, MD;  Location: [facility];  Service: Urology;  Laterality: N/A;  GENERAL ANESTHESIA WITH PARALYSIS   UPPER GI ENDOSCOPY N/A 03/10/2015   Procedure: UPPER GI ENDOSCOPY;  Surgeon: Johnathan Hausen, MD;  Location: [facility];  Service: General;  Laterality: N/A;   VENTRAL HERNIA REPAIR  09/06/2011   Procedure: HERNIA REPAIR VENTRAL ADULT;  Surgeon: Pedro Earls, MD;  Location: [facility];  Service: General;  Laterality: N/A;   VENTRAL HERNIA REPAIR  09/06/2011   Procedure: LAPAROSCOPIC VENTRAL HERNIA;  Surgeon: Rodman Key  Verdie Drown, MD;  Location: [facility];  Service: General;  Laterality: N/A;    Allergies: Ciprofloxacin and Wound dressings [skin protectants, misc.]  Medications: Prior to Admission medications   Medication Sig Start Date End Date Taking? Authorizing Provider  ALPRAZolam Duanne Moron) 0.25 MG tablet Take 0.25 mg by mouth at bedtime as needed for anxiety or sleep.    [provider]  Cholecalciferol (VITAMIN D3) 2000 UNITS TABS Take 2,000 Units by mouth every morning.    [provider]  ezetimibe (ZETIA) 10 MG tablet  Take 10 mg by mouth at bedtime.    [provider]  hydrochlorothiazide (HYDRODIURIL) 25 MG tablet Take 25 mg by mouth daily. 05/19/22   [provider]  Liniments (BLUE-EMU SUPER STRENGTH EX) Apply 1 Application topically daily as needed (pain).    [provider]  loratadine (CLARITIN) 10 MG tablet Take 10 mg by mouth daily as needed for allergies. Started on 06/06/22    [provider]  losartan (COZAAR) 100 MG tablet Take 100 mg by mouth every morning.    [provider]  metoprolol succinate (TOPROL-XL) 50 MG 24 hr tablet Take 50 mg by mouth daily. Take with or immediately following a meal.    [provider]  Multiple Vitamins-Minerals (LUTEIN-ZEAXANTHIN PO) Take 1 tablet by mouth daily.    [provider]  NON FORMULARY Pt uses a cpap nightly    [provider]  OVER THE COUNTER MEDICATION Take 1 capsule by mouth at bedtime as needed (sleep). Somnapure otc sleep aid    [provider]  phenazopyridine (PYRIDIUM) 200 MG tablet Take 1 tablet (200 mg total) by mouth 3 (three) times daily as needed for pain. Patient not taking: Reported on 08/21/2022 06/15/22   Raynelle Bring, MD  rosuvastatin (CRESTOR) 20 MG tablet Take 20 mg by mouth at bedtime.    [provider]  Tetrahydrozoline HCl (VISINE OP) Place 1 drop into both eyes daily as needed (redness).    [provider]  Turmeric (QC TUMERIC COMPLEX PO) Take 1,500 mg by mouth daily.    [provider]     Family History  Problem Relation Age of Onset   Lung cancer Mother    Coronary artery disease Mother    Stroke Father    Diabetes Father    Bladder Cancer Father    Dementia Father    Cervical cancer Sister     Social History   Socioeconomic History   Marital status: Married    Spouse name: Not on file   Number of children: Not on file   Years of education: Not on file   Highest education level: Not on file  Occupational  History   Not on file  Tobacco Use   Smoking status: Never   Smokeless tobacco: Never  Vaping Use   Vaping Use: Never used  Substance and Sexual Activity   Alcohol use: Yes    Comment: occ. alcohol   Drug use: No   Sexual activity: Yes  Other Topics Concern   Not on file  Social History Narrative   Not on file   Social Determinants of Health   Financial Resource Strain: Not on file  Food Insecurity: No Food Insecurity (05/24/2022)   Hunger Vital Sign    Worried About Running Out of Food in the Last Year: Never true    Ran Out of Food in the Last Year: Never true  Transportation Needs: No Transportation Needs (05/24/2022)  PRAPARE - Hydrologist (Medical): No    Lack of Transportation (Non-Medical): No  Physical Activity: Not on file  Stress: Not on file  Social Connections: Not on file     Review of Systems: A 12 point ROS discussed and pertinent positives are indicated in the HPI above.  All other systems are negative.  Vital Signs: There were no vitals taken for this visit.   Physical Exam Vitals reviewed.  Constitutional:      General: He is not in acute distress.    Appearance: He is not ill-appearing.  HENT:     Head: Normocephalic.     Mouth/Throat:     Mouth: Mucous membranes are moist.     Pharynx: Oropharynx is clear.  Cardiovascular:     Rate and Rhythm: Normal rate and regular rhythm.     Heart sounds: Normal heart sounds.  Pulmonary:     Effort: Pulmonary effort is normal.     Breath sounds: Normal breath sounds.  Abdominal:     General: Abdomen is flat. Bowel sounds are normal.     Palpations: Abdomen is soft.  Musculoskeletal:     Cervical back: Neck supple.  Skin:    General: Skin is warm and dry.     Coloration: Skin is not jaundiced or pale.  Neurological:     Mental Status: He is alert and oriented to person, place, and time.  Psychiatric:        Mood and Affect: Mood normal.        Behavior: Behavior  normal.        Judgment: Judgment normal.     MD Evaluation Airway: WNL Heart: WNL Abdomen: WNL Chest/ Lungs: WNL ASA  Classification: 3 Mallampati/Airway Score: Two  Imaging: DG Chest 1 View  Result Date: 08/27/2022 CLINICAL DATA:  Preoperative chest radiograph prior to renal cryoablation. EXAM: CHEST  1 VIEW COMPARISON:  11/15/2018 and prior radiographs FINDINGS: Cardiomediastinal silhouette is unchanged. There is no evidence of focal airspace disease, pulmonary edema, suspicious pulmonary nodule/mass, pleural effusion, or pneumothorax. No acute bony abnormalities are identified. IMPRESSION: No active disease. Electronically Signed   By: Margarette Canada M.D.   On: 08/27/2022 10:29    Labs:  CBC: Recent Labs    05/29/22 0458 05/30/22 0251 06/07/22 0840 08/25/22 0836  WBC 5.8 8.1 8.2 6.2  HGB 13.1 12.0* 13.6 14.1  HCT 39.1 35.6* 41.2 42.5  PLT 267 284 393 285    COAGS: Recent Labs    08/25/22 0836  INR 1.1    BMP: Recent Labs    05/30/22 0251 05/31/22 0433 06/07/22 0840 08/25/22 0836  NA 137 139 140 141  K 4.0 3.6 3.8 3.7  CL 107 109 108 108  CO2 '26 25 27 29  '$ GLUCOSE 167* 129* 121* 107*  BUN '21 19 19 20  '$ CALCIUM 7.8* 8.0* 9.0 8.9  CREATININE 0.68 0.75 1.01 0.99  GFRNONAA >60 >60 >60 >60    LIVER FUNCTION TESTS: Recent Labs    05/24/22 0917 05/25/22 0534 05/30/22 0251 05/31/22 0433  BILITOT 1.5* 1.1 0.6 0.7  AST 14* '15 15 23  '$ ALT '17 15 17 22  '$ ALKPHOS 70 54 47 47  PROT 7.8 6.3* 5.5* 5.5*  ALBUMIN 4.8 3.7 2.7* 2.8*    TUMOR MARKERS: No results for input(s): "AFPTM", "CEA", "CA199", "CHROMGRNA" in the last 8760 hours.  Assessment and Plan: 76 y.o. male with right renal lesion compatible for renal cell carcinoma who presents  for CT guided cryoablation under [place].   NPO since MN VS hypertensive 156/88 - pt took home HTN meds this morning  Labs pending at 0808 hrs  Not on AC/AP  Risks and benefits of image guided renal cryoablation and possible  biopsy was discussed with the patient including, but not limited to, failure to treat entire lesion, bleeding, infection, damage to adjacent structures, hematuria, urine leak, decrease in renal function or post procedural neuropathy.  All of the patient's questions were answered and the patient is agreeable to proceed. Consent signed and in chart.    Thank you for this interesting consult.  I greatly enjoyed meeting RONDO SPITTLER and look forward to participating in their care.  A copy of this report was sent to the requesting provider on this date.  Electronically Signed: Tera Mater, PA-C 08/30/2022, 8:06 AM   I spent a total of  30 Minutes   in face to face in clinical consultation, greater than 50% of which was counseling/coordinating care for R renal lesion cryoablation and possible biopsy.   This chart was dictated using voice recognition software.  Despite best efforts to proofread,  errors can occur which can change the documentation meaning.

## 2022-08-29 NOTE — Anesthesia Preprocedure Evaluation (Signed)
Anesthesia Evaluation  Patient identified by MRN, date of birth, ID band Patient awake    Reviewed: Allergy & Precautions, NPO status , Patient's Chart, lab work & pertinent test results  Airway Mallampati: III  TM Distance: >3 FB Neck ROM: Full    Dental  (+) Teeth Intact, Dental Advisory Given   Pulmonary sleep apnea and Continuous Positive Airway Pressure Ventilation    Pulmonary exam normal breath sounds clear to auscultation       Cardiovascular hypertension (156/88 preop), Pt. on medications and Pt. on home beta blockers Normal cardiovascular exam Rhythm:Regular Rate:Normal     Neuro/Psych  PSYCHIATRIC DISORDERS Anxiety     negative neurological ROS     GI/Hepatic negative GI ROS, Neg liver ROS,,,  Endo/Other  Obesity BMI 35  Renal/GU Renal disease (R renal mass)  negative genitourinary   Musculoskeletal  (+) Arthritis , Osteoarthritis,    Abdominal  (+) + obese  Peds  Hematology negative hematology ROS (+) Hb 14.1   Anesthesia Other Findings   Reproductive/Obstetrics negative OB ROS                              Anesthesia Physical Anesthesia Plan  ASA: 3  Anesthesia Plan: General   Post-op Pain Management: Tylenol PO (pre-op)*   Induction: Intravenous  PONV Risk Score and Plan: 2 and Ondansetron, Dexamethasone and Treatment may vary due to age or medical condition  Airway Management Planned: Oral ETT  Additional Equipment: None  Intra-op Plan:   Post-operative Plan: Extubation in OR  Informed Consent: I have reviewed the patients History and Physical, chart, labs and discussed the procedure including the risks, benefits and alternatives for the proposed anesthesia with the patient or authorized representative who has indicated his/her understanding and acceptance.     Dental advisory given  Plan Discussed with: CRNA  Anesthesia Plan Comments: (Last airway  06/2022: Ventilation: Mask ventilation without difficulty Laryngoscope Size: Mac and 4 Grade View: Grade I Tube type: Oral Tube size: 7.5 mm Number of attempts: 1 )         Anesthesia Quick Evaluation

## 2022-08-30 ENCOUNTER — Ambulatory Visit (HOSPITAL_BASED_OUTPATIENT_CLINIC_OR_DEPARTMENT_OTHER): Payer: Medicare Other | Admitting: Anesthesiology

## 2022-08-30 ENCOUNTER — Observation Stay (HOSPITAL_COMMUNITY)
Admission: RE | Admit: 2022-08-30 | Discharge: 2022-08-30 | Disposition: A | Discharge status: Home / Self Care | Payer: Medicare Other | Source: Ambulatory Visit | Attending: Interventional Radiology | Admitting: Interventional Radiology

## 2022-08-30 ENCOUNTER — Encounter (HOSPITAL_COMMUNITY): Payer: Self-pay

## 2022-08-30 ENCOUNTER — Encounter (HOSPITAL_COMMUNITY)
Admission: RE | Disposition: A | Discharge status: Home / Self Care | Payer: Self-pay | Source: Home / Self Care | Attending: Interventional Radiology

## 2022-08-30 ENCOUNTER — Encounter (HOSPITAL_COMMUNITY): Payer: Self-pay | Admitting: Interventional Radiology

## 2022-08-30 ENCOUNTER — Other Ambulatory Visit: Payer: Self-pay

## 2022-08-30 ENCOUNTER — Ambulatory Visit (HOSPITAL_COMMUNITY)
Admission: RE | Admit: 2022-08-30 | Discharge: 2022-08-30 | Disposition: A | Discharge status: Home / Self Care | Payer: Medicare Other | Attending: Interventional Radiology | Admitting: Interventional Radiology

## 2022-08-30 ENCOUNTER — Other Ambulatory Visit: Payer: Self-pay | Admitting: Student

## 2022-08-30 ENCOUNTER — Ambulatory Visit (HOSPITAL_COMMUNITY): Payer: Medicare Other | Admitting: Anesthesiology

## 2022-08-30 DIAGNOSIS — Z8546 Personal history of malignant neoplasm of prostate: Secondary | ICD-10-CM | POA: Insufficient documentation

## 2022-08-30 DIAGNOSIS — I10 Essential (primary) hypertension: Secondary | ICD-10-CM | POA: Insufficient documentation

## 2022-08-30 DIAGNOSIS — G4730 Sleep apnea, unspecified: Secondary | ICD-10-CM | POA: Insufficient documentation

## 2022-08-30 DIAGNOSIS — G4733 Obstructive sleep apnea (adult) (pediatric): Secondary | ICD-10-CM | POA: Diagnosis not present

## 2022-08-30 DIAGNOSIS — N2889 Other specified disorders of kidney and ureter: Secondary | ICD-10-CM | POA: Insufficient documentation

## 2022-08-30 DIAGNOSIS — C641 Malignant neoplasm of right kidney, except renal pelvis: Secondary | ICD-10-CM | POA: Diagnosis not present

## 2022-08-30 DIAGNOSIS — D649 Anemia, unspecified: Secondary | ICD-10-CM | POA: Insufficient documentation

## 2022-08-30 DIAGNOSIS — Z9989 Dependence on other enabling machines and devices: Secondary | ICD-10-CM

## 2022-08-30 DIAGNOSIS — C642 Malignant neoplasm of left kidney, except renal pelvis: Secondary | ICD-10-CM

## 2022-08-30 HISTORY — PX: RADIOLOGY WITH ANESTHESIA: SHX6223

## 2022-08-30 LAB — CBC WITH DIFFERENTIAL/PLATELET
Abs Immature Granulocytes: 0.02 10*3/uL (ref 0.00–0.07)
Basophils Absolute: 0.1 10*3/uL (ref 0.0–0.1)
Basophils Relative: 1 %
Eosinophils Absolute: 0.1 10*3/uL (ref 0.0–0.5)
Eosinophils Relative: 2 %
HCT: 42 % (ref 39.0–52.0)
Hemoglobin: 14.1 g/dL (ref 13.0–17.0)
Immature Granulocytes: 0 %
Lymphocytes Relative: 20 %
Lymphs Abs: 1.4 10*3/uL (ref 0.7–4.0)
MCH: 31.1 pg (ref 26.0–34.0)
MCHC: 33.6 g/dL (ref 30.0–36.0)
MCV: 92.5 fL (ref 80.0–100.0)
Monocytes Absolute: 0.6 10*3/uL (ref 0.1–1.0)
Monocytes Relative: 8 %
Neutro Abs: 4.8 10*3/uL (ref 1.7–7.7)
Neutrophils Relative %: 69 %
Platelets: 269 10*3/uL (ref 150–400)
RBC: 4.54 MIL/uL (ref 4.22–5.81)
RDW: 12.5 % (ref 11.5–15.5)
WBC: 6.9 10*3/uL (ref 4.0–10.5)
nRBC: 0 % (ref 0.0–0.2)

## 2022-08-30 LAB — BASIC METABOLIC PANEL
Anion gap: 8 (ref 5–15)
BUN: 23 mg/dL (ref 8–23)
CO2: 26 mmol/L (ref 22–32)
Calcium: 8.9 mg/dL (ref 8.9–10.3)
Chloride: 108 mmol/L (ref 98–111)
Creatinine, Ser: 1.02 mg/dL (ref 0.61–1.24)
GFR, Estimated: >60 mL/min (ref >60)
Glucose, Bld: 114 mg/dL — ABNORMAL HIGH (ref 70–99)
Potassium: 3.4 mmol/L — ABNORMAL LOW (ref 3.5–5.1)
Sodium: 142 mmol/L (ref 135–145)

## 2022-08-30 LAB — PROTIME-INR
INR: 1 (ref 0.8–1.2)
Prothrombin Time: 13 seconds (ref 11.4–15.2)

## 2022-08-30 LAB — TYPE AND SCREEN
ABO/RH(D): A POS
Antibody Screen: NEGATIVE

## 2022-08-30 SURGERY — Surgical Case
Anesthesia: *Unknown

## 2022-08-30 SURGERY — RADIOLOGY WITH ANESTHESIA
Anesthesia: General

## 2022-08-30 MED ORDER — HYDROCHLOROTHIAZIDE 25 MG PO TABS: 25.0000 mg | ORAL_TABLET | Freq: Every day | ORAL | Status: DC

## 2022-08-30 MED ORDER — CHLORHEXIDINE GLUCONATE 0.12 % MT SOLN: 15.0000 mL | Freq: Once | OROMUCOSAL | Status: DC

## 2022-08-30 MED ORDER — PROPOFOL 10 MG/ML IV BOLUS
INTRAVENOUS | Status: DC | PRN
  Administered 2022-08-30: 200 mg via INTRAVENOUS

## 2022-08-30 MED ORDER — HYDROMORPHONE HCL 1 MG/ML IJ SOLN: 0.2500 mg | INTRAMUSCULAR | Status: DC | PRN

## 2022-08-30 MED ORDER — OXYCODONE HCL 5 MG/5ML PO SOLN: 5.0000 mg | Freq: Once | ORAL | Status: DC | PRN

## 2022-08-30 MED ORDER — DEXAMETHASONE SODIUM PHOSPHATE 10 MG/ML IJ SOLN
INTRAMUSCULAR | Status: DC | PRN
  Administered 2022-08-30: 10 mg via INTRAVENOUS

## 2022-08-30 MED ORDER — AMISULPRIDE (ANTIEMETIC) 5 MG/2ML IV SOLN: 10.0000 mg | Freq: Once | INTRAVENOUS | Status: DC | PRN

## 2022-08-30 MED ORDER — SODIUM CHLORIDE (PF) 0.9 % IJ SOLN
INTRAMUSCULAR | Status: AC
  Filled 2022-08-30: qty 50

## 2022-08-30 MED ORDER — ACETAMINOPHEN 500 MG PO TABS
1000.0000 mg | ORAL_TABLET | Freq: Once | ORAL | Status: AC
  Administered 2022-08-30: 1000 mg via ORAL
  Filled 2022-08-30: qty 2

## 2022-08-30 MED ORDER — PHENYLEPHRINE 80 MCG/ML (10ML) SYRINGE FOR IV PUSH (FOR BLOOD PRESSURE SUPPORT)
PREFILLED_SYRINGE | INTRAVENOUS | Status: DC | PRN
  Administered 2022-08-30: 80 ug via INTRAVENOUS
  Administered 2022-08-30: 160 ug via INTRAVENOUS

## 2022-08-30 MED ORDER — SODIUM CHLORIDE 0.9 % IV SOLN
INTRAVENOUS | Status: AC
  Filled 2022-08-30: qty 250

## 2022-08-30 MED ORDER — SUGAMMADEX SODIUM 500 MG/5ML IV SOLN
INTRAVENOUS | Status: DC | PRN
  Administered 2022-08-30: 400 mg via INTRAVENOUS

## 2022-08-30 MED ORDER — LIDOCAINE 2% (20 MG/ML) 5 ML SYRINGE
INTRAMUSCULAR | Status: DC | PRN
  Administered 2022-08-30: 80 mg via INTRAVENOUS

## 2022-08-30 MED ORDER — ORAL CARE MOUTH RINSE: 15.0000 mL | Freq: Once | OROMUCOSAL | Status: DC

## 2022-08-30 MED ORDER — FENTANYL CITRATE (PF) 250 MCG/5ML IJ SOLN
INTRAMUSCULAR | Status: AC
  Filled 2022-08-30: qty 5

## 2022-08-30 MED ORDER — PROPOFOL 10 MG/ML IV BOLUS: INTRAVENOUS | Status: DC | PRN

## 2022-08-30 MED ORDER — METOPROLOL SUCCINATE ER 50 MG PO TB24: 50.0000 mg | ORAL_TABLET | Freq: Every day | ORAL | Status: DC

## 2022-08-30 MED ORDER — SODIUM CHLORIDE 0.9 % IV SOLN
2.0000 g | INTRAVENOUS | Status: AC
  Administered 2022-08-30: 2 g via INTRAVENOUS
  Filled 2022-08-30: qty 2

## 2022-08-30 MED ORDER — EZETIMIBE 10 MG PO TABS: 10.0000 mg | ORAL_TABLET | Freq: Every day | ORAL | Status: DC

## 2022-08-30 MED ORDER — PHENYLEPHRINE HCL (PRESSORS) 10 MG/ML IV SOLN: INTRAVENOUS | Status: DC | PRN

## 2022-08-30 MED ORDER — LACTATED RINGERS IV SOLN: INTRAVENOUS | Status: DC

## 2022-08-30 MED ORDER — LACTATED RINGERS IV SOLN: INTRAVENOUS | Status: DC | PRN

## 2022-08-30 MED ORDER — HYDROCODONE-ACETAMINOPHEN 5-325 MG PO TABS: 1.0000 | ORAL_TABLET | Freq: Four times a day (QID) | ORAL | 0 refills | Status: DC | PRN

## 2022-08-30 MED ORDER — ONDANSETRON HCL 4 MG/2ML IJ SOLN: 4.0000 mg | Freq: Once | INTRAMUSCULAR | Status: DC | PRN

## 2022-08-30 MED ORDER — OXYCODONE HCL 5 MG PO TABS: 5.0000 mg | ORAL_TABLET | Freq: Once | ORAL | Status: DC | PRN

## 2022-08-30 MED ORDER — ONDANSETRON HCL 4 MG/2ML IJ SOLN
INTRAMUSCULAR | Status: DC | PRN
  Administered 2022-08-30: 4 mg via INTRAVENOUS

## 2022-08-30 MED ORDER — ROCURONIUM BROMIDE 10 MG/ML (PF) SYRINGE
PREFILLED_SYRINGE | INTRAVENOUS | Status: DC | PRN
  Administered 2022-08-30: 100 mg via INTRAVENOUS
  Administered 2022-08-30: 20 mg via INTRAVENOUS

## 2022-08-30 MED ORDER — LOSARTAN POTASSIUM 50 MG PO TABS: 100.0000 mg | ORAL_TABLET | Freq: Every morning | ORAL | Status: DC

## 2022-08-30 MED ORDER — FENTANYL CITRATE (PF) 250 MCG/5ML IJ SOLN
INTRAMUSCULAR | Status: DC | PRN
  Administered 2022-08-30: 50 ug via INTRAVENOUS
  Administered 2022-08-30: 100 ug via INTRAVENOUS

## 2022-08-30 MED ORDER — EPHEDRINE SULFATE-NACL 50-0.9 MG/10ML-% IV SOSY
PREFILLED_SYRINGE | INTRAVENOUS | Status: DC | PRN
  Administered 2022-08-30 (×2): 5 mg via INTRAVENOUS

## 2022-08-30 MED ORDER — ROSUVASTATIN CALCIUM 20 MG PO TABS: 20.0000 mg | ORAL_TABLET | Freq: Every day | ORAL | Status: DC

## 2022-08-30 MED ORDER — LORATADINE 10 MG PO TABS: 10.0000 mg | ORAL_TABLET | Freq: Every day | ORAL | Status: DC | PRN

## 2022-08-30 MED ORDER — ALPRAZOLAM 0.25 MG PO TABS: 0.2500 mg | ORAL_TABLET | Freq: Every evening | ORAL | Status: DC | PRN

## 2022-08-30 NOTE — Discharge Instructions (Signed)
Cryoablation of Renal Tumors, Care After  This sheet gives you information about how to care for yourself after your procedure. Your health care provider may also give you more specific instructions. If you have problems or questions, contact your health care provider.  What can I expect after the procedure? After your procedure, it is common to have pain and discomfort in the upper abdomen. You may be given prescription pain medicines to control this if the pain is severe.   Follow these instructions at home: Puncture site care Follow instructions from your health care provider about how to take care of your incision. Make sure you:  - Wash your hands with soap and water before you change your bandage (dressing). If soap and water are not available, use hand sanitizer.  - Ok to shower 48 hours following procedure. Recommend showering with bandage on, remove bandage immediately after showering and pat area dry. No further dressing changes needed after this- ensure area remains clean and dry until fully healed.  - No submerging (swimming, bathing) for 7 days post-procedure. Check your puncture site area every day for signs of infection. Check for: - Redness, swelling, or pain. - Fluid or blood. - Warmth. - Pus or a bad smell. Activity Rest as often as needing during the first few days of recovery. No stooping, bending, or lifting more than 10 pounds for 1 week.  General instructions To prevent or treat constipation while you are taking prescription pain medicine, your health care provider may recommend that you: - Drink enough fluid to keep your urine clear or pale yellow. - Take over-the-counter or prescription medicines. - Eat foods that are high in fiber, such as fresh fruits and vegetables, whole grains, and beans. - Limit foods that are high in fat and processed sugars, such as fried and sweet foods. - Do not use any products that contain nicotine or tobacco, such as cigarettes and  e-cigarettes. If you need help quitting, ask your health care provider. - Keep all follow-up visits as told by your health care provider. This is important.  3-4 week televisit with the doctor who performed the procedure. Our office will call you to set up the appointment.   Contact a health care provider if: You cannot pass gas. You are unable to have a bowel movement within 3 days. You have a skin rash.  Get help right away if: You have a fever. You have severe or lasting pain in your abdomen, shoulder, or back. You have trouble swallowing or breathing. You have severe weakness or dizziness. You have chest pain or shortness of breath.   This information is not intended to replace advice given to you by your health care provider. Make sure you discuss any questions you have with your health care provider. 

## 2022-08-30 NOTE — Anesthesia Postprocedure Evaluation (Signed)
Anesthesia Post Note  Patient: Troy [name]  Procedure(s) Performed: RENAL CRYOABLATION     Patient location during evaluation: PACU Anesthesia Type: General Level of consciousness: awake and alert, oriented and patient cooperative Pain management: pain level controlled Vital Signs Assessment: post-procedure vital signs reviewed and stable Respiratory status: spontaneous breathing, nonlabored ventilation and respiratory function stable Cardiovascular status: blood pressure returned to baseline and stable Postop Assessment: no apparent nausea or vomiting Anesthetic complications: no   No notable events documented.  Last Vitals:  Vitals:   08/30/22 1145 08/30/22 1200  BP: (!) 175/88 (!) 164/88  Pulse: (!) 53 (!) 50  Resp: 13 12  Temp:    SpO2: 93% 90%    Last Pain:  Vitals:   08/30/22 1200  TempSrc:   PainSc: [place]

## 2022-08-30 NOTE — Procedures (Signed)
Pre procedural Dx: Right sided RCC Post procedural Dx: Same  Technically successful CT guided cryoablation of right sided RCC.   EBL: Trace Complications: None immediate.   Ronny Bacon, MD Pager #: [phone]

## 2022-08-30 NOTE — Transfer of Care (Signed)
Immediate Anesthesia Transfer of Care Note  Patient: CJ EDGELL  Procedure(s) Performed: RENAL CRYOABLATION  Patient Location: PACU  Anesthesia Type:General  Level of Consciousness: awake and alert   Airway & Oxygen Therapy: Patient Spontanous Breathing and Patient connected to face mask oxygen  Post-op Assessment: Report given to RN, Post -op Vital signs reviewed and stable, and BP retake of 168/88  Post vital signs: Reviewed and stable  Last Vitals:  Vitals Value Taken Time  BP 176/153 08/30/22 1046  Temp    Pulse 57 08/30/22 1048  Resp 16 08/30/22 1048  SpO2 97 % 08/30/22 1048  Vitals shown include unvalidated device data.  Last Pain:  Vitals:   08/30/22 0757  TempSrc:   PainSc: 0-No pain         Complications: No notable events documented.

## 2022-08-30 NOTE — Discharge Summary (Addendum)
Patient ID: Troy [name] MRN: [id number] DOB/AGE: 1946/02/15 76 y.o.  Admit date: 08/30/2022 Discharge date: 08/30/2022  Supervising Physician: Sandi Mariscal  Patient Status: [facility] - In-pt  Admission Diagnoses: R renal mass   Discharge Diagnoses:  Principal Problem:   Renal cell carcinoma of right kidney [facility])   Discharged Condition: good  Hospital Course:   Patient presented to [facility] IR for an image-guided right renal mass cryoablation with Dr. Pascal Lux this morning. Procedure occurred without major complications and patient was transferred to PACU in stable condition (VSS,  R flank puncture site stable.)   Patient seen in PACU with Dr. Pascal Lux.  Patient awake and alert  no complaints at this time, asks if he can go home today.  Still has Foley catheter, urine is clear, no gross hematuria. Patient denies right flank pain, N/V, lightheadedness, SOB or chest plapitation.  Right flank puncture site stable, no active bleeding. Plan to discharge home today if patient can void w/o Foley and has stead gait (uses a cane at baseline) and follow-up with Dr. Pascal Lux for televisit 3-4 weeks after discharge.  PACU team will remove Foley now and have patient void around 4pm, if patient can void w/o difficulty, he will be discharged home today.   30 pills of Garrison sent to patient's pharmacy.   Consults: None  Significant Diagnostic Studies: none   Treatments: Cryoablation of right renal mass.    Discharge Exam: Blood pressure 133/87, pulse 66, temperature 97.6 F (36.4 C), resp. rate 18, height '5\' 9"'$  (1.753 m), weight 235 lb 0.2 oz (106.6 kg), SpO2 93 %.  Physical Exam Vitals and nursing note reviewed.  Constitutional:      General: Patient is not in acute distress.    Appearance: Normal appearance. Patient is not ill-appearing.  HENT:     Head: Normocephalic and atraumatic.     Mouth/Throat:     Mouth: Mucous membranes are moist.     Pharynx: Oropharynx is clear.   Cardiovascular:     Rate and Rhythm: Normal rate and regular rhythm.     Pulses: Normal pulses.     Heart sounds: Normal heart sounds.  Pulmonary:     Effort: Pulmonary effort is normal.     Breath sounds: Normal breath sounds.  Abdominal:     General: Abdomen is flat. Bowel sounds are normal.     Palpations: Abdomen is soft.  Musculoskeletal:     Cervical back: Neck supple.  Skin:    General: Skin is warm and dry.     Coloration: Skin is not jaundiced or pale.   Positive dressing on R flank puncture site. Site is unremarkable with no erythema, edema, tenderness, bleeding or drainage. Minimal amount of old, dry blood noted on the dressing. Dressing otherwise clean, dry, and intact.    Neurological:     Mental Status: Patient is alert and oriented to person, place, and time.  Psychiatric:        Mood and Affect: Mood normal.        Behavior: Behavior normal.        Judgment: Judgment normal.    Disposition: Discharge disposition: 01-Home or Self Care       Discharge Instructions     Call MD for:   Complete by: As directed    Call MD for:  difficulty breathing, headache or visual disturbances   Complete by: As directed    Call MD for:  extreme fatigue   Complete by: As directed  Call MD for:  hives   Complete by: As directed    Call MD for:  persistant dizziness or light-headedness   Complete by: As directed    Call MD for:  persistant nausea and vomiting   Complete by: As directed    Call MD for:  redness, tenderness, or signs of infection (pain, swelling, redness, odor or green/yellow discharge around incision site)   Complete by: As directed    Call MD for:  severe uncontrolled pain   Complete by: As directed    Call MD for:  temperature >100.4   Complete by: As directed    Diet - low sodium heart healthy   Complete by: As directed    Discharge wound care:   Complete by: As directed    Keep the dressing on when you take shower tomorrow. Remove dressing  and pat dry the right flank area really well before place a new dressing, you can use regular Band-aid. Keep the site covered until fully healed. Can start taking shower tomorrow, no submerge (swimming or sitting in a bath tub) for 7 days.   Increase activity slowly   Complete by: As directed    Lifting restrictions   Complete by: As directed    No lifting, bending, or stooping more than 10 pounds for 1 week.      Allergies as of 08/30/2022       Reactions   Ciprofloxacin Diarrhea, Nausea And Vomiting   Can take Levaquin   Wound Dressings [skin Protectants, Misc.]    Dermatitis        Medication List     TAKE these medications    ALPRAZolam 0.25 MG tablet Commonly known as: XANAX Take 0.25 mg by mouth at bedtime as needed for anxiety or sleep.   BLUE-EMU SUPER STRENGTH EX Apply 1 Application topically daily as needed (pain).   ezetimibe 10 MG tablet Commonly known as: ZETIA Take 10 mg by mouth at bedtime.   hydrochlorothiazide 25 MG tablet Commonly known as: HYDRODIURIL Take 25 mg by mouth daily.   HYDROcodone-acetaminophen 5-325 MG tablet Commonly known as: Norco Take 1 tablet by mouth every 6 (six) hours as needed for moderate pain.   loratadine 10 MG tablet Commonly known as: CLARITIN Take 10 mg by mouth daily as needed for allergies. Started on 06/06/22   losartan 100 MG tablet Commonly known as: COZAAR Take 100 mg by mouth every morning.   LUTEIN-ZEAXANTHIN PO Take 1 tablet by mouth daily.   metoprolol succinate 50 MG 24 hr tablet Commonly known as: TOPROL-XL Take 50 mg by mouth daily. Take with or immediately following a meal.   NON FORMULARY Pt uses a cpap nightly   OVER THE COUNTER MEDICATION Take 1 capsule by mouth at bedtime as needed (sleep). Somnapure otc sleep aid   phenazopyridine 200 MG tablet Commonly known as: Pyridium Take 1 tablet (200 mg total) by mouth 3 (three) times daily as needed for pain.   QC TUMERIC COMPLEX PO Take 1,500  mg by mouth daily.   rosuvastatin 20 MG tablet Commonly known as: CRESTOR Take 20 mg by mouth at bedtime.   VISINE OP Place 1 drop into both eyes daily as needed (redness).   Vitamin D3 50 MCG (2000 UT) Tabs Take 2,000 Units by mouth every morning.               Discharge Care Instructions  (From admission, onward)           Start  Ordered   08/30/22 0000  Discharge wound care:       Comments: Keep the dressing on when you take shower tomorrow. Remove dressing and pat dry the right flank area really well before place a new dressing, you can use regular Band-aid. Keep the site covered until fully healed. Can start taking shower tomorrow, no submerge (swimming or sitting in a bath tub) for 7 days.   08/30/22 1433            Follow-up Information     Sandi Mariscal, MD Follow up.   Specialties: Interventional Radiology, Radiology Why: Follow up visit with Dr. Pascal Lux in 3-4 weeks. Our scheduler will call you to set up the appointment. Contact information: [address] [place] [address] [phone]                  Electronically Signed: Tera Mater, PA-C 08/30/2022, 2:34 PM   I have spent Less Than 30 Minutes discharging Troy [name].

## 2022-08-30 NOTE — Sedation Documentation (Signed)
Anesthesia in to sedate and monitor. 

## 2022-08-30 NOTE — Anesthesia Procedure Notes (Signed)
Procedure Name: Intubation Date/Time: 08/30/2022 8:37 AM  Performed by: Sharlette Dense, CRNAPre-anesthesia Checklist: Patient identified, Emergency Drugs available, Suction available and Patient being monitored Patient Re-evaluated:Patient Re-evaluated prior to induction Oxygen Delivery Method: Circle system utilized Preoxygenation: Pre-oxygenation with 100% oxygen Induction Type: IV induction Ventilation: Mask ventilation without difficulty Laryngoscope Size: Miller and 3 Grade View: Grade I Tube type: Oral Tube size: 8.0 mm Number of attempts: 1 Airway Equipment and Method: Stylet Placement Confirmation: ETT inserted through vocal cords under direct vision, positive ETCO2 and breath sounds checked- equal and bilateral Secured at: 24 cm Tube secured with: Tape Dental Injury: Teeth and Oropharynx as per pre-operative assessment

## 2022-08-31 ENCOUNTER — Encounter (HOSPITAL_COMMUNITY): Payer: Self-pay | Admitting: Interventional Radiology

## 2022-09-22 ENCOUNTER — Emergency Department (HOSPITAL_BASED_OUTPATIENT_CLINIC_OR_DEPARTMENT_OTHER)
Admission: EM | Admit: 2022-09-22 | Discharge: 2022-09-22 | Disposition: A | Discharge status: Home / Self Care | Payer: Medicare Other | Attending: Emergency Medicine | Admitting: Emergency Medicine

## 2022-09-22 ENCOUNTER — Other Ambulatory Visit: Payer: Self-pay

## 2022-09-22 ENCOUNTER — Emergency Department (HOSPITAL_BASED_OUTPATIENT_CLINIC_OR_DEPARTMENT_OTHER): Payer: Medicare Other | Admitting: Radiology

## 2022-09-22 ENCOUNTER — Encounter (HOSPITAL_BASED_OUTPATIENT_CLINIC_OR_DEPARTMENT_OTHER): Payer: Self-pay | Admitting: Emergency Medicine

## 2022-09-22 DIAGNOSIS — Z20822 Contact with and (suspected) exposure to covid-19: Secondary | ICD-10-CM | POA: Insufficient documentation

## 2022-09-22 DIAGNOSIS — R059 Cough, unspecified: Secondary | ICD-10-CM | POA: Diagnosis present

## 2022-09-22 DIAGNOSIS — J209 Acute bronchitis, unspecified: Secondary | ICD-10-CM

## 2022-09-22 DIAGNOSIS — Z85528 Personal history of other malignant neoplasm of kidney: Secondary | ICD-10-CM | POA: Diagnosis not present

## 2022-09-22 LAB — CBC WITH DIFFERENTIAL/PLATELET
Abs Immature Granulocytes: 0.02 10*3/uL (ref 0.00–0.07)
Basophils Absolute: 0 10*3/uL (ref 0.0–0.1)
Basophils Relative: 0 %
Eosinophils Absolute: 0.2 10*3/uL (ref 0.0–0.5)
Eosinophils Relative: 2 %
HCT: 43.2 % (ref 39.0–52.0)
Hemoglobin: 14.8 g/dL (ref 13.0–17.0)
Immature Granulocytes: 0 %
Lymphocytes Relative: 18 %
Lymphs Abs: 1.3 10*3/uL (ref 0.7–4.0)
MCH: 31 pg (ref 26.0–34.0)
MCHC: 34.3 g/dL (ref 30.0–36.0)
MCV: 90.4 fL (ref 80.0–100.0)
Monocytes Absolute: 0.6 10*3/uL (ref 0.1–1.0)
Monocytes Relative: 8 %
Neutro Abs: 5.2 10*3/uL (ref 1.7–7.7)
Neutrophils Relative %: 72 %
Platelets: 321 10*3/uL (ref 150–400)
RBC: 4.78 MIL/uL (ref 4.22–5.81)
RDW: 12.5 % (ref 11.5–15.5)
WBC: 7.3 10*3/uL (ref 4.0–10.5)
nRBC: 0 % (ref 0.0–0.2)

## 2022-09-22 LAB — RESP PANEL BY RT-PCR (RSV, FLU A&B, COVID)  RVPGX2
Influenza A by PCR: NEGATIVE
Influenza B by PCR: NEGATIVE
Resp Syncytial Virus by PCR: NEGATIVE
SARS Coronavirus 2 by RT PCR: NEGATIVE

## 2022-09-22 LAB — BASIC METABOLIC PANEL
Anion gap: 11 (ref 5–15)
BUN: 22 mg/dL (ref 8–23)
CO2: 25 mmol/L (ref 22–32)
Calcium: 9.1 mg/dL (ref 8.9–10.3)
Chloride: 105 mmol/L (ref 98–111)
Creatinine, Ser: 1.06 mg/dL (ref 0.61–1.24)
GFR, Estimated: >60 mL/min (ref >60)
Glucose, Bld: 123 mg/dL — ABNORMAL HIGH (ref 70–99)
Potassium: 3.8 mmol/L (ref 3.5–5.1)
Sodium: 141 mmol/L (ref 135–145)

## 2022-09-22 LAB — GROUP A STREP BY PCR: Group A Strep by PCR: NOT DETECTED

## 2022-09-22 MED ORDER — PREDNISONE 10 MG PO TABS: ORAL_TABLET | ORAL | 0 refills | Status: DC

## 2022-09-22 NOTE — Discharge Instructions (Addendum)
You were seen in the emergency department for continued cough after 2 weeks.  Your chest x-ray did not show any definite pneumonia.  Your COVID and flu tests are negative at this time and you can see the results on MyChart in the next few hours.  We are treating you with a steroid taper of prednisone.  You can continue to use Mucinex and inhaler as needed.  Drink plenty of fluids.  Follow-up with your primary care doctor.  Return to the emergency department if any worsening or concerning symptoms

## 2022-09-22 NOTE — ED Triage Notes (Addendum)
Pt c/o cough x3w. Pt reports that cough is nonproductive and he feels like he cannot clear the mucus out of his lungs. Negative covid test at home. Reports PCP gave him albuterol and mucinex without and relief.   Pt requesting strep test and requesting to wait for chest xray until he sees EDP.

## 2022-09-22 NOTE — ED Provider Notes (Signed)
[facility] EMERGENCY DEPT Provider Note   CSN: [id number] Arrival date & time: 09/22/22  3846     History  Chief Complaint  Patient presents with   Cough    Troy [name] is a 77 y.o. male.  He has a history of renal cell cancer status post cryoablation.  He is complaining of 2 weeks of nonproductive cough.  It is associated with a little bit of sore throat.  No fevers chills nausea vomiting body aches sinus pressure.  He saw his PCP about a week ago was given Mucinex and albuterol inhaler.  Does not seem significantly different.  He has had this before and he thinks a course of prednisone may be beneficial.  Wife sick with similar symptoms.  The history is provided by the patient.  Cough Cough characteristics:  Non-productive Sputum characteristics:  Nondescript Onset quality:  Gradual Duration:  2 weeks Timing:  Intermittent Progression:  Unchanged Smoker: no   Relieved by:  Nothing Ineffective treatments:  Beta-agonist inhaler and cough suppressants Associated symptoms: sore throat   Associated symptoms: no chest pain, no chills, no fever, no myalgias, no rhinorrhea and no shortness of breath        Home Medications Prior to Admission medications   Medication Sig Start Date End Date Taking? Authorizing Provider  ALPRAZolam Duanne Moron) 0.25 MG tablet Take 0.25 mg by mouth at bedtime as needed for anxiety or sleep.    [provider]  Cholecalciferol (VITAMIN D3) 2000 UNITS TABS Take 2,000 Units by mouth every morning.    [provider]  ezetimibe (ZETIA) 10 MG tablet Take 10 mg by mouth at bedtime.    [provider]  hydrochlorothiazide (HYDRODIURIL) 25 MG tablet Take 25 mg by mouth daily. 05/19/22   [provider]  HYDROcodone-acetaminophen (NORCO) 5-325 MG tablet Take 1 tablet by mouth every 6 (six) hours as needed for moderate pain. 08/30/22   Han, Aimee H, PA-C  Liniments (BLUE-EMU SUPER STRENGTH EX) Apply 1 Application  topically daily as needed (pain).    [provider]  loratadine (CLARITIN) 10 MG tablet Take 10 mg by mouth daily as needed for allergies. Started on 06/06/22    [provider]  losartan (COZAAR) 100 MG tablet Take 100 mg by mouth every morning.    [provider]  metoprolol succinate (TOPROL-XL) 50 MG 24 hr tablet Take 50 mg by mouth daily. Take with or immediately following a meal.    [provider]  Multiple Vitamins-Minerals (LUTEIN-ZEAXANTHIN PO) Take 1 tablet by mouth daily.    [provider]  NON FORMULARY Pt uses a cpap nightly    [provider]  OVER THE COUNTER MEDICATION Take 1 capsule by mouth at bedtime as needed (sleep). Somnapure otc sleep aid    [provider]  phenazopyridine (PYRIDIUM) 200 MG tablet Take 1 tablet (200 mg total) by mouth 3 (three) times daily as needed for pain. Patient not taking: Reported on 08/21/2022 06/15/22   Raynelle Bring, MD  rosuvastatin (CRESTOR) 20 MG tablet Take 20 mg by mouth at bedtime.    [provider]  Tetrahydrozoline HCl (VISINE OP) Place 1 drop into both eyes daily as needed (redness).    [provider]  Turmeric (QC TUMERIC COMPLEX PO) Take 1,500 mg by mouth daily.    [provider]      Allergies    Ciprofloxacin and Wound dressings [skin protectants, misc.]    Review of Systems   Review  of Systems  Constitutional:  Negative for chills and fever.  HENT:  Positive for sore throat. Negative for rhinorrhea.   Respiratory:  Positive for cough. Negative for shortness of breath.   Cardiovascular:  Negative for chest pain.  Musculoskeletal:  Negative for myalgias.    Physical Exam Updated Vital Signs BP (!) 154/93 (BP Location: Right Arm)   Pulse 70   Temp 98 F (36.7 C) (Oral)   Resp 18   SpO2 95%  Physical Exam Vitals and nursing note reviewed.  Constitutional:      General: He is not in acute distress.    Appearance: Normal  appearance. He is well-developed.  HENT:     Head: Normocephalic and atraumatic.  Eyes:     Conjunctiva/sclera: Conjunctivae normal.  Cardiovascular:     Rate and Rhythm: Normal rate and regular rhythm.     Heart sounds: No murmur heard. Pulmonary:     Effort: Pulmonary effort is normal. No respiratory distress.     Breath sounds: Normal breath sounds.  Abdominal:     Palpations: Abdomen is soft.     Tenderness: There is no abdominal tenderness.  Musculoskeletal:        General: No swelling.     Cervical back: Neck supple.  Skin:    General: Skin is warm and dry.     Capillary Refill: Capillary refill takes less than 2 seconds.  Neurological:     Mental Status: He is alert.     ED Results / Procedures / Treatments   Labs (all labs ordered are listed, but only abnormal results are displayed) Labs Reviewed  BASIC METABOLIC PANEL - Abnormal; Notable for the following components:      Result Value   Glucose, Bld 123 (*)    All other components within normal limits  RESP PANEL BY RT-PCR (RSV, FLU A&B, COVID)  [id number]  GROUP A STREP BY PCR  CBC WITH DIFFERENTIAL/PLATELET    EKG None  Radiology DG Chest 2 View  Result Date: 09/22/2022 CLINICAL DATA:  cough EXAM: CHEST - 2 VIEW COMPARISON:  08/25/2022 FINDINGS: Cardiac silhouette is unremarkable. No pneumothorax or pleural effusion. The lungs are clear. The visualized skeletal structures are unremarkable. IMPRESSION: No acute cardiopulmonary process. Electronically Signed   By: Sammie Bench M.D.   On: 09/22/2022 09:53    Procedures Procedures    Medications Ordered in ED Medications - No data to display  ED Course/ Medical Decision Making/ A&P Clinical Course as of 09/22/22 1646  Fri Sep 22, 2022  0954 Chest x-ray interpreted by me as no acute infiltrates.  Awaiting radiology reading. [MB]    Clinical Course User Index [MB] Hayden Rasmussen, MD                           Medical Decision Making Amount and/or  Complexity of Data Reviewed Labs: ordered. Radiology: ordered.  Risk Prescription drug management.   This patient complains of cough x 2 weeks; this involves an extensive number of treatment Options and is a complaint that carries with it a high risk of complications and morbidity. The differential includes infection, viral, pneumonia, COVID, flu, RSV, dehydration  I ordered, reviewed and interpreted labs, which included CBC with normal white count normal hemoglobin, chemistries normal other than mildly elevated glucose, COVID flu RSV negative I ordered imaging studies which included chest x-ray and I independently    visualized and interpreted imaging which showed no acute infiltrates  Previous records obtained and reviewed in epic no recent admissions  Social determinants considered, no significant barriers Critical Interventions: None  After the interventions stated above, I reevaluated the patient and found patient to be oxygenating well in no distress Admission and further testing considered, will cover with steroids for acute bronchitis.  He already has an inhaler and he declines antibiotics.  Recommended close follow-up with his PCP.  Return instructions discussed.         Final Clinical Impression(s) / ED Diagnoses Final diagnoses:  Acute bronchitis, unspecified organism    Rx / DC Orders ED Discharge Orders          Ordered    predniSONE (DELTASONE) 10 MG tablet        09/22/22 1001              Hayden Rasmussen, MD 09/22/22 1648

## 2022-09-22 NOTE — ED Notes (Signed)
Discharge paperwork given and verbally understood. 

## 2022-09-29 ENCOUNTER — Ambulatory Visit
Admission: RE | Admit: 2022-09-29 | Discharge: 2022-09-29 | Disposition: A | Discharge status: Home / Self Care | Payer: Medicare Other | Source: Ambulatory Visit | Attending: Student | Admitting: Student

## 2022-09-29 DIAGNOSIS — N2889 Other specified disorders of kidney and ureter: Secondary | ICD-10-CM

## 2022-09-29 NOTE — Progress Notes (Signed)
Patient ID: Troy [name], male   DOB: Jun 04, 1946, 77 y.o.   MRN: [id number]        Chief Complaint: Post right sided renal cryoablation (08/30/2022)  Referring Physician(s): Borden,Lester   History of Present Illness: Troy [name] is a 77 y.o. male with past medical history significant for hypertension, anemia, sleep apnea and prostate cancer who was found to have an approximately 1.7 x 1.2 cm partially exophytic enhancing lesion arising from the posterior aspect of the inferior pole of the right kidney on abdominal CT performed 05/24/2022 performed for left lower quadrant abdominal pain at which time the patient was noted to have a high-grade small bowel obstruction (for which the patient ultimately underwent partial small bowel resection and lysis of adhesions).  Additionally, patient was found to have an approximate 1.8 cm enhancing lesion within the left side of the urinary bladder (which was been subsequently removed by Dr. Alinda Money).     The renal lesion was subsequently evaluated on dedicated abdominal MRI performed 06/25/2022, following resolution of patient's obstructive symptoms.  The abdominal MRI confirmed the presence of an approximately 1.7 x 1.5 cm enhancing lesion within the posterior inferior pole of the right kidney compatible with a renal cell carcinoma.    The patient was initially seen for consultation on 07/13/2022 and ultimately underwent technically successful right-sided image guided renal cryoablation on 08/30/2022.  Note, as the patient's procedure was performed early in the morning and he recovered uneventfully from the anesthesia, he was discharged the same day.  The patient is seen at the interventional radiology clinic today for postprocedural evaluation and management.  He is accompanied by his wife.  The patient states that he has recovered completely from the procedure and experienced no flank pain or hematuria.  Past Medical History:  Diagnosis Date    Abdominal distention    hernia   Anemia    Arthritis    big toes    Cancer (HCC)    prostate    Chronic kidney disease    Hypertension    Lumbar herniated disc    L5   Pneumonia    Rash    yeast infection from bladder sling surgery    Sciatica of right side 09/01/2011   right leg-tx. Tylenol   Sleep apnea    cpap    Past Surgical History:  Procedure Laterality Date   HERNIA REPAIR  September 01, 2010   INCONTINENCE SURGERY  Jan 31, 2011   INSERTION OF MESH  03/10/2015   Procedure: INSERTION OF MESH;  Surgeon: Johnathan Hausen, MD;  Location: [facility];  Service: General;;   IR RADIOLOGIST EVAL & MGMT  05/15/2018   IR RADIOLOGIST EVAL & MGMT  07/13/2022   LAPAROSCOPIC NISSEN FUNDOPLICATION N/A 0/93/2355   Procedure: LAPAROSCOPIC NISSEN FUNDOPLICATION AND HIATAL HERNIA REPAIR;  Surgeon: Johnathan Hausen, MD;  Location: [facility];  Service: General;  Laterality: N/A;   LAPAROTOMY N/A 05/29/2022   Procedure: laparoscopic assisted small bowel resection with anastamosis;  Surgeon: Kieth Brightly, Arta Bruce, MD;  Location: [facility];  Service: General;  Laterality: N/A;   PROSTATECTOMY  reb 9, 2010   RADIOLOGY WITH ANESTHESIA N/A 08/30/2022   Procedure: RENAL CRYOABLATION;  Surgeon: Sandi Mariscal, MD;  Location: [facility];  Service: Anesthesiology;  Laterality: N/A;   TONSILLECTOMY     TRANSURETHRAL RESECTION OF BLADDER TUMOR N/A 06/15/2022   Procedure: TRANSURETHRAL RESECTION OF BLADDER TUMOR (TURBT)/ CYSTOSCOPY/ BILATERAL RETROGRADE/;  Surgeon: Raynelle Bring, MD;  Location: [facility];  Service:  Urology;  Laterality: N/A;  GENERAL ANESTHESIA WITH PARALYSIS   UPPER GI ENDOSCOPY N/A 03/10/2015   Procedure: UPPER GI ENDOSCOPY;  Surgeon: Johnathan Hausen, MD;  Location: [facility];  Service: General;  Laterality: N/A;   VENTRAL HERNIA REPAIR  09/06/2011   Procedure: HERNIA REPAIR VENTRAL ADULT;  Surgeon: Pedro Earls, MD;  Location: [facility];  Service: General;  Laterality: N/A;   VENTRAL HERNIA REPAIR  09/06/2011    Procedure: LAPAROSCOPIC VENTRAL HERNIA;  Surgeon: Pedro Earls, MD;  Location: [facility];  Service: General;  Laterality: N/A;    Allergies: Ciprofloxacin and Wound dressings [skin protectants, misc.]  Medications: Prior to Admission medications   Medication Sig Start Date End Date Taking? Authorizing Provider  ALPRAZolam Duanne Moron) 0.25 MG tablet Take 0.25 mg by mouth at bedtime as needed for anxiety or sleep.    [provider]  Cholecalciferol (VITAMIN D3) 2000 UNITS TABS Take 2,000 Units by mouth every morning.    [provider]  ezetimibe (ZETIA) 10 MG tablet Take 10 mg by mouth at bedtime.    [provider]  hydrochlorothiazide (HYDRODIURIL) 25 MG tablet Take 25 mg by mouth daily. 05/19/22   [provider]  HYDROcodone-acetaminophen (NORCO) 5-325 MG tablet Take 1 tablet by mouth every 6 (six) hours as needed for moderate pain. 08/30/22   Han, Aimee H, PA-C  Liniments (BLUE-EMU SUPER STRENGTH EX) Apply 1 Application topically daily as needed (pain).    [provider]  loratadine (CLARITIN) 10 MG tablet Take 10 mg by mouth daily as needed for allergies. Started on 06/06/22    [provider]  losartan (COZAAR) 100 MG tablet Take 100 mg by mouth every morning.    [provider]  metoprolol succinate (TOPROL-XL) 50 MG 24 hr tablet Take 50 mg by mouth daily. Take with or immediately following a meal.    [provider]  Multiple Vitamins-Minerals (LUTEIN-ZEAXANTHIN PO) Take 1 tablet by mouth daily.    [provider]  NON FORMULARY Pt uses a cpap nightly    [provider]  OVER THE COUNTER MEDICATION Take 1 capsule by mouth at bedtime as needed (sleep). Somnapure otc sleep aid    [provider]  phenazopyridine (PYRIDIUM) 200 MG tablet Take 1 tablet (200 mg total) by mouth 3 (three) times daily as needed for pain. Patient not taking: Reported on 08/21/2022 06/15/22   Raynelle Bring, MD   predniSONE (DELTASONE) 10 MG tablet 6 tablets first day decrease by 1 tablet each day until finished. 09/22/22   Hayden Rasmussen, MD  rosuvastatin (CRESTOR) 20 MG tablet Take 20 mg by mouth at bedtime.    [provider]  Tetrahydrozoline HCl (VISINE OP) Place 1 drop into both eyes daily as needed (redness).    [provider]  Turmeric (QC TUMERIC COMPLEX PO) Take 1,500 mg by mouth daily.    [provider]     Family History  Problem Relation Age of Onset   Lung cancer Mother    Coronary artery disease Mother    Stroke Father    Diabetes Father    Bladder Cancer Father    Dementia Father    Cervical cancer Sister     Social History   Socioeconomic History   Marital status: Married    Spouse name: Not on file   Number of children: Not on file   Years of education: Not on file   Highest education level: Not on file  Occupational  History   Not on file  Tobacco Use   Smoking status: Never   Smokeless tobacco: Never  Vaping Use   Vaping Use: Never used  Substance and Sexual Activity   Alcohol use: Yes    Comment: occ. alcohol   Drug use: No   Sexual activity: Yes  Other Topics Concern   Not on file  Social History Narrative   Not on file   Social Determinants of Health   Financial Resource Strain: Not on file  Food Insecurity: No Food Insecurity (05/24/2022)   Hunger Vital Sign    Worried About Running Out of Food in the Last Year: Never true    Ran Out of Food in the Last Year: Never true  Transportation Needs: No Transportation Needs (05/24/2022)   PRAPARE - Hydrologist (Medical): No    Lack of Transportation (Non-Medical): No  Physical Activity: Not on file  Stress: Not on file  Social Connections: Not on file    ECOG Status: 0 - Asymptomatic  Review of Systems: A 12 point ROS discussed and pertinent positives are indicated in the HPI above.  All other systems are negative.  Review of  Systems  Vital Signs: BP 132/75 (BP Location: Left Arm, Patient Position: Sitting, Cuff Size: Normal)   Pulse 65   Temp 97.8 F (36.6 C) (Oral)   Wt 106.6 kg   SpO2 96% Comment: room air  BMI 34.70 kg/m   Physical Exam  Imaging:  Subsequent examinations were personally reviewed in detail:  Abdominal MRI - 06/25/2022. CT abdomen pelvis - 05/24/2022; 06/19/2018 Ultrasound and CT-guided right renal cryoablation - 08/30/2022  Selected images from the above examinations were reviewed in detail with the patient and the patient's wife.  Completion images during right-sided renal cryoablation demonstrated technically excellent result with the ice ball encompassing the entirety of the expected location of the partially exophytic right-sided renal lesion.   DG Chest 2 View  Result Date: 09/22/2022 CLINICAL DATA:  cough EXAM: CHEST - 2 VIEW COMPARISON:  08/25/2022 FINDINGS: Cardiac silhouette is unremarkable. No pneumothorax or pleural effusion. The lungs are clear. The visualized skeletal structures are unremarkable. IMPRESSION: No acute cardiopulmonary process. Electronically Signed   By: Sammie Bench M.D.   On: 09/22/2022 09:53   CT GUIDE TISSUE ABLATION  Result Date: 08/30/2022 CLINICAL DATA:  Incidentally discovered right-sided renal lesion. Patient presents today for image guided right renal cryoablation Please refer to formal consultation in the epic EMR dated 07/13/2022 for additional details. EXAM: CT-GUIDED RIGHT RENAL CRYOABLATION ANESTHESIA/SEDATION: General MEDICATIONS: Ceftriaxone 2 g IV. The antibiotic was administered in an appropriate time interval prior to needle puncture of the skin. COMPARISON:  Abdominal MRI-06/25/2022; CT abdomen and pelvis-05/24/2022 CONTRAST:  NONE PROCEDURE: The procedure, risks, benefits, and alternatives were explained to the patient. Questions regarding the procedure were encouraged and answered. The patient understands and consents to the  procedure. The patient was placed under general anesthesia. Initial un-enhanced CT was performed in a prone position to localize the grossly unchanged approximately 1.8 x 1.4 cm partially exophytic lesion arising from the inferior posterior aspect of the right kidney (image 54, series 2). The CT gantry table position was marked and the mixed echogenic lesion was identified sonographically (image 2). Operative site was prepped and draped in usual sterile fashion. A 22 gauge spinal needle was utilized for trajectory planning purposes Next, under intermittent CT guidance, two 17 gauge 1.5 CX ice rods were placed bracketing the superior inferior  aspects of the partially exophytic lesion. Final probe positioning was confirmed and the cryoablation was initiated. Initial 10 minute cycle of cryoablation was performed. This was followed by a 5 minute thaw cycle. A second 10 minute cycle of cryoablation was then performed. During ablation, periodic CT imaging was performed to monitor ice ball formation and morphology. After active thaw, the cryoablation probes were removed and dressings were applied. COMPLICATIONS: None immediate. FINDINGS: Preprocedural noncontrast images demonstrate grossly unchanged appearance of the approximately 1.8 x 1.4 cm partially exophytic lesion arising from the posteroinferior pole of the right kidney (image 54, series 2). Under CT guidance, the cryoablation probes were positioned bracketing the superior and inferior aspects of the lesion and the cryoablation was performed. Acquired images performed during both freeze cycles demonstrates a technically excellent result with the ice ball encompassing the entirety of the partially exophytic renal lesion. IMPRESSION: Technically successful CT guided percutaneous cryoablation of partially exophytic right renal lesion. Follow-up consultation will be performed in approximately 3-4 weeks. Electronically Signed   By: Sandi Mariscal M.D.   On: 08/30/2022 16:33     Labs:  CBC: Recent Labs    06/07/22 0840 08/25/22 0836 08/30/22 0752 09/22/22 0924  WBC 8.2 6.2 6.9 7.3  HGB 13.6 14.1 14.1 14.8  HCT 41.2 42.5 42.0 43.2  PLT 393 285 269 321    COAGS: Recent Labs    08/25/22 0836 08/30/22 0752  INR 1.1 1.0    BMP: Recent Labs    06/07/22 0840 08/25/22 0836 08/30/22 0752 09/22/22 0924  NA 140 141 142 141  K 3.8 3.7 3.4* 3.8  CL 108 108 108 105  CO2 '27 29 26 25  '$ GLUCOSE 121* 107* 114* 123*  BUN '19 20 23 22  '$ CALCIUM 9.0 8.9 8.9 9.1  CREATININE 1.01 0.99 1.02 1.06  GFRNONAA >60 >60 >60 >60    LIVER FUNCTION TESTS: Recent Labs    05/24/22 0917 05/25/22 0534 05/30/22 0251 05/31/22 0433  BILITOT 1.5* 1.1 0.6 0.7  AST 14* '15 15 23  '$ ALT '17 15 17 22  '$ ALKPHOS 70 54 47 47  PROT 7.8 6.3* 5.5* 5.5*  ALBUMIN 4.8 3.7 2.7* 2.8*    TUMOR MARKERS: No results for input(s): "AFPTM", "CEA", "CA199", "CHROMGRNA" in the last 8760 hours.  Assessment and Plan:  Troy [name] is a 77 y.o. male with past medical history significant for hypertension, anemia, sleep apnea and prostate cancer who was found to have an approximately 1.7 x 1.2 cm partially exophytic enhancing lesion arising from the posterior aspect of the inferior pole of the right kidney on abdominal CT performed 05/24/2022 for which the patient underwent a technically successful right-sided image guided renal cryoablation on 08/30/2022.  The patient states that he has recovered completely from the procedure and experienced no flank pain or hematuria.  Subsequent examinations were personally reviewed in detail:  Abdominal MRI - 06/25/2022. CT abdomen pelvis - 05/24/2022; 06/19/2018 Ultrasound and CT-guided right renal cryoablation - 08/30/2022  Selected images from the above examinations were reviewed in detail with the patient and the patient's wife.    Completion images during right-sided renal cryoablation demonstrated technically excellent result with the ice ball  encompassing the entirety of the expected location of the partially exophytic right-sided renal lesion.  I explained that I am hopeful that the cryoablation has resulted and complete treatment of the right-sided renal cell carcinoma however this will be proven with subsequent surveillance imaging.    I explained that we will initially  perform surveillance imaging with abdominal MRI every 3 months for the first year, with the first scan being performed 3 months after the procedure date which will be in late March/early April 2024.  The patient and the patient's wife demonstrated excellent understanding of the above conversation and are in agreement with the proposed plan of care.  Plan:   - Follow-up telemedicine or in person consultation following acquisition of initial surveillance postprocedural MRI 3 months following the cryoablation (late March/early April 2024)  A copy of this report was sent to the requesting provider on this date.  Electronically Signed: Sandi Mariscal 09/29/2022, 12:07 PM   I spent a total of 15 Minutes in face to face in clinical consultation, greater than 50% of which was counseling/coordinating care for post right sided renal cryoablation.

## 2022-11-14 ENCOUNTER — Other Ambulatory Visit: Payer: Self-pay | Admitting: Interventional Radiology

## 2022-11-14 DIAGNOSIS — N2889 Other specified disorders of kidney and ureter: Secondary | ICD-10-CM

## 2022-12-22 ENCOUNTER — Ambulatory Visit
Admission: RE | Admit: 2022-12-22 | Discharge: 2022-12-22 | Disposition: A | Discharge status: Home / Self Care | Payer: Medicare Other | Source: Ambulatory Visit | Attending: Interventional Radiology | Admitting: Interventional Radiology

## 2022-12-22 DIAGNOSIS — N2889 Other specified disorders of kidney and ureter: Secondary | ICD-10-CM

## 2022-12-22 MED ORDER — GADOPICLENOL 0.5 MMOL/ML IV SOLN
10.0000 mL | Freq: Once | INTRAVENOUS | Status: AC | PRN
  Administered 2022-12-22: 10 mL via INTRAVENOUS

## 2022-12-25 ENCOUNTER — Ambulatory Visit
Admission: RE | Admit: 2022-12-25 | Discharge: 2022-12-25 | Disposition: A | Discharge status: Home / Self Care | Payer: Medicare Other | Source: Ambulatory Visit | Attending: Interventional Radiology | Admitting: Interventional Radiology

## 2022-12-25 DIAGNOSIS — N2889 Other specified disorders of kidney and ureter: Secondary | ICD-10-CM

## 2022-12-25 NOTE — Progress Notes (Signed)
Patient ID: Troy [name], male   DOB: 18-Aug-1946, 77 y.o.   MRN: [id number]        Chief Complaint: Post right sided renal cryoablation (08/30/2022)   Referring Physician(s): Borden,Lester   History of Present Illness: Troy [name] is a 77 y.o. male with past medical history significant for hypertension, anemia, sleep apnea and prostate cancer who was found to have an approximately 1.7 x 1.2 cm partially exophytic enhancing lesion arising from the posterior aspect of the inferior pole of the right kidney on abdominal CT performed 05/24/2022 performed for left lower quadrant abdominal pain at which time the patient was noted to have a high-grade small bowel obstruction (for which the patient ultimately underwent partial small bowel resection and lysis of adhesions).  Additionally, patient was found to have an approximate 1.8 cm enhancing lesion within the left side of the urinary bladder (which was been subsequently removed by Dr. Laverle Patter).     The renal lesion was subsequently evaluated on dedicated abdominal MRI performed 06/25/2022 confirming the presence of an approximately 1.7 x 1.5 cm enhancing lesion within the posterior inferior pole of the right kidney compatible with a renal cell carcinoma for which the patient underwent a technically successful right-sided image guided renal cryoablation on 08/30/2022.    The patient is seen today in postprocedural evaluation and management following acquisition of first postprocedural surveillance abdominal MRI performed 12/22/2022.  Patient remains asymptomatic in regards to the right-sided renal cryoablation.  Past Medical History:  Diagnosis Date   Abdominal distention    hernia   Anemia    Arthritis    big toes    Cancer (HCC)    prostate    Chronic kidney disease    Hypertension    Lumbar herniated disc    L5   Pneumonia    Rash    yeast infection from bladder sling surgery    Sciatica of right side 09/01/2011   right leg-tx.  Tylenol   Sleep apnea    cpap    Past Surgical History:  Procedure Laterality Date   HERNIA REPAIR  September 01, 2010   INCONTINENCE SURGERY  Jan 31, 2011   INSERTION OF MESH  03/10/2015   Procedure: INSERTION OF MESH;  Surgeon: Luretha Murphy, MD;  Location: [facility];  Service: General;;   IR RADIOLOGIST EVAL & MGMT  05/15/2018   IR RADIOLOGIST EVAL & MGMT  07/13/2022   LAPAROSCOPIC NISSEN FUNDOPLICATION N/A 03/10/2015   Procedure: LAPAROSCOPIC NISSEN FUNDOPLICATION AND HIATAL HERNIA REPAIR;  Surgeon: Luretha Murphy, MD;  Location: [facility];  Service: General;  Laterality: N/A;   LAPAROTOMY N/A 05/29/2022   Procedure: laparoscopic assisted small bowel resection with anastamosis;  Surgeon: Sheliah Hatch, De Blanch, MD;  Location: [facility];  Service: General;  Laterality: N/A;   PROSTATECTOMY  reb 9, 2010   RADIOLOGY WITH ANESTHESIA N/A 08/30/2022   Procedure: RENAL CRYOABLATION;  Surgeon: Simonne Come, MD;  Location: [facility];  Service: Anesthesiology;  Laterality: N/A;   TONSILLECTOMY     TRANSURETHRAL RESECTION OF BLADDER TUMOR N/A 06/15/2022   Procedure: TRANSURETHRAL RESECTION OF BLADDER TUMOR (TURBT)/ CYSTOSCOPY/ BILATERAL RETROGRADE/;  Surgeon: Heloise Purpura, MD;  Location: [facility];  Service: Urology;  Laterality: N/A;  GENERAL ANESTHESIA WITH PARALYSIS   UPPER GI ENDOSCOPY N/A 03/10/2015   Procedure: UPPER GI ENDOSCOPY;  Surgeon: Luretha Murphy, MD;  Location: [facility];  Service: General;  Laterality: N/A;   VENTRAL HERNIA REPAIR  09/06/2011   Procedure: HERNIA REPAIR VENTRAL ADULT;  Surgeon: Valarie Merino, MD;  Location: [facility];  Service: General;  Laterality: N/A;   VENTRAL HERNIA REPAIR  09/06/2011   Procedure: LAPAROSCOPIC VENTRAL HERNIA;  Surgeon: Valarie Merino, MD;  Location: [facility];  Service: General;  Laterality: N/A;    Allergies: Ciprofloxacin and Wound dressings [skin protectants, misc.]  Medications: Prior to Admission medications   Medication Sig Start Date End Date Taking?  Authorizing Provider  ALPRAZolam Prudy Feeler) 0.25 MG tablet Take 0.25 mg by mouth at bedtime as needed for anxiety or sleep.    [provider]  Cholecalciferol (VITAMIN D3) 2000 UNITS TABS Take 2,000 Units by mouth every morning.    [provider]  ezetimibe (ZETIA) 10 MG tablet Take 10 mg by mouth at bedtime.    [provider]  hydrochlorothiazide (HYDRODIURIL) 25 MG tablet Take 25 mg by mouth daily. 05/19/22   [provider]  HYDROcodone-acetaminophen (NORCO) 5-325 MG tablet Take 1 tablet by mouth every 6 (six) hours as needed for moderate pain. 08/30/22   Han, Aimee H, PA-C  Liniments (BLUE-EMU SUPER STRENGTH EX) Apply 1 Application topically daily as needed (pain).    [provider]  loratadine (CLARITIN) 10 MG tablet Take 10 mg by mouth daily as needed for allergies. Started on 06/06/22    [provider]  losartan (COZAAR) 100 MG tablet Take 100 mg by mouth every morning.    [provider]  metoprolol succinate (TOPROL-XL) 50 MG 24 hr tablet Take 50 mg by mouth daily. Take with or immediately following a meal.    [provider]  Multiple Vitamins-Minerals (LUTEIN-ZEAXANTHIN PO) Take 1 tablet by mouth daily.    [provider]  NON FORMULARY Pt uses a cpap nightly    [provider]  OVER THE COUNTER MEDICATION Take 1 capsule by mouth at bedtime as needed (sleep). Somnapure otc sleep aid    [provider]  phenazopyridine (PYRIDIUM) 200 MG tablet Take 1 tablet (200 mg total) by mouth 3 (three) times daily as needed for pain. Patient not taking: Reported on 08/21/2022 06/15/22   Heloise Purpura, MD  predniSONE (DELTASONE) 10 MG tablet 6 tablets first day decrease by 1 tablet each day until finished. 09/22/22   Terrilee Files, MD  rosuvastatin (CRESTOR) 20 MG tablet Take 20 mg by mouth at bedtime.    [provider]  Tetrahydrozoline HCl (VISINE OP) Place 1 drop into both eyes daily as  needed (redness).    [provider]  Turmeric (QC TUMERIC COMPLEX PO) Take 1,500 mg by mouth daily.    [provider]     Family History  Problem Relation Age of Onset   Lung cancer Mother    Coronary artery disease Mother    Stroke Father    Diabetes Father    Bladder Cancer Father    Dementia Father    Cervical cancer Sister     Social History   Socioeconomic History   Marital status: Married    Spouse name: Not on file   Number of children: Not on file   Years of education: Not on file   Highest education level: Not on file  Occupational History   Not on file  Tobacco Use   Smoking status: Never   Smokeless tobacco: Never  Vaping Use   Vaping Use: Never used  Substance and Sexual Activity   Alcohol use: Yes    Comment: occ. alcohol   Drug use: No   Sexual  activity: Yes  Other Topics Concern   Not on file  Social History Narrative   Not on file   Social Determinants of Health   Financial Resource Strain: Not on file  Food Insecurity: No Food Insecurity (05/24/2022)   Hunger Vital Sign    Worried About Running Out of Food in the Last Year: Never true    Ran Out of Food in the Last Year: Never true  Transportation Needs: No Transportation Needs (05/24/2022)   PRAPARE - Administrator, Civil Service (Medical): No    Lack of Transportation (Non-Medical): No  Physical Activity: Not on file  Stress: Not on file  Social Connections: Not on file    ECOG Status: 0 - Asymptomatic  Review of Systems  Review of Systems: A 12 point ROS discussed and pertinent positives are indicated in the HPI above.  All other systems are negative.  Physical Exam No direct physical exam was performed (except for noted visual exam findings with Video Visits).   Vital Signs: There were no vitals taken for this visit.  Imaging:  The following examinations were reviewed in detail:  Abdominal MRI - 12/22/2022; 06/25/2022 CT abdomen pelvis -  05/24/2022 Procedural images during image guided right renal cryoablation - 08/30/2022  Review of abdominal MRI performed 12/22/2022 demonstrates a technically excellent result without definitive evidence of residual nodular enhancing tissue to suggest residual disease.   MR ABDOMEN WWO CONTRAST  Result Date: 12/22/2022 CLINICAL DATA:  Right renal mass. History of bladder and renal cancer. Status post right sided cryoablation of a 1.8 cm lower pole right renal lesion on 08/30/2022. EXAM: MRI ABDOMEN WITHOUT AND WITH CONTRAST TECHNIQUE: Multiplanar multisequence MR imaging of the abdomen was performed both before and after the administration of intravenous contrast. CONTRAST:  10 cc Vueway COMPARISON:  06/25/2022 and the intraoperative CT of 08/30/2022. FINDINGS: Lower chest: Normal heart size without pericardial or pleural effusion. Hepatobiliary: Moderate hepatic steatosis the mild hepatomegaly at 17.4 cm craniocaudal. Again identified is a large area of arterial phase hyperenhancement within the anterior right hepatic lobe including on 22/11. This is isointense to the remainder of the liver on other pulse sequences, likely a perfusion anomaly. There is a smaller focus of arterial phase hyperenhancement at less than a cm in the posterior right hepatic lobe on 22/11 which is also unchanged and likely a perfusion anomaly. No suspicious liver lesion.  Small dependent gallstones. Pancreas: Fatty replaced pancreatic head and uncinate process. No duct dilatation or acute inflammation. Spleen:  Normal in size, without focal abnormality. Adrenals/Urinary Tract:  Normal adrenal glands. Bilateral simple and complex renal cysts, without postcontrast enhancement. The largest lesion is in the upper pole left kidney at 2.4 cm. The ablation defect is identified within the posterior lower pole right kidney. Measures 3.4 x 2.3 by 2.3 cm on 63/11 in 23/17. There is precontrast central T1 hyperintensity on 64/10 and curvilinear  thin postcontrast enhancement including on subtracted image 63/19. No nodular enhancement identified. No hydronephrosis or collecting system complication. Stomach/Bowel: Proximal gastric underdistention. Normal abdominal bowel loops. Vascular/Lymphatic: Normal caliber of the aorta and branch vessels. Patent renal veins. No retroperitoneal or retrocrural adenopathy. Other:  No ascites. Musculoskeletal: No acute osseous abnormality. IMPRESSION: 1. Interval cryoablation of the lower pole right renal lesion with expected procedure related precontrast T1 hyperintensity and curvilinear peripheral enhancement. No findings to suggest residual or locally recurrent disease. 2. Hepatic steatosis and mild hepatomegaly 3. Cholelithiasis Electronically Signed   By: Hosie Spangle.D.  On: 12/22/2022 15:29    Labs:  CBC: Recent Labs    06/07/22 0840 08/25/22 0836 08/30/22 0752 09/22/22 0924  WBC 8.2 6.2 6.9 7.3  HGB 13.6 14.1 14.1 14.8  HCT 41.2 42.5 42.0 43.2  PLT 393 285 269 321    COAGS: Recent Labs    08/25/22 0836 08/30/22 0752  INR 1.1 1.0    BMP: Recent Labs    06/07/22 0840 08/25/22 0836 08/30/22 0752 09/22/22 0924  NA 140 141 142 141  K 3.8 3.7 3.4* 3.8  CL 108 108 108 105  CO2 27 29 26 25   GLUCOSE 121* 107* 114* 123*  BUN 19 20 23 22   CALCIUM 9.0 8.9 8.9 9.1  CREATININE 1.01 0.99 1.02 1.06  GFRNONAA >60 >60 >60 >60    LIVER FUNCTION TESTS: Recent Labs    05/24/22 0917 05/25/22 0534 05/30/22 0251 05/31/22 0433  BILITOT 1.5* 1.1 0.6 0.7  AST 14* 15 15 23   ALT 17 15 17 22   ALKPHOS 70 54 47 47  PROT 7.8 6.3* 5.5* 5.5*  ALBUMIN 4.8 3.7 2.7* 2.8*    TUMOR MARKERS: No results for input(s): "AFPTM", "CEA", "CA199", "CHROMGRNA" in the last 8760 hours.  Assessment and Plan:  Troy [name] is a 77 y.o. male with past medical history significant for hypertension, anemia, sleep apnea and prostate cancer who was found to have an approximately 1.7 x 1.2 cm partially  exophytic enhancing lesion arising from the posterior aspect of the inferior pole of the right kidney on abdominal CT performed 05/24/2022 for which the patient underwent a technically successful right-sided image guided renal cryoablation on 08/30/2022.    The following examinations were reviewed in detail: Abdominal MRI - 12/22/2022; 06/25/2022 CT abdomen pelvis - 05/24/2022 Procedural images during image guided right renal cryoablation - 08/30/2022  Review of abdominal MRI performed 12/22/2022 demonstrates a technically excellent result without definitive evidence of residual nodular enhancing tissue to suggest residual disease.  Typically, surveillance evaluation occurs every 3 months for the first year followed by every 6 months for the second year with a final surveillance exam performed 3 years following the intervention. As such, we will obtain next follow-up surveillance postprocedural MRI in 3 months (mid July 2024.  The patient knows to call the interventional radiology clinic with any interval questions or concerns.  Plan: - Follow-up telemedicine consultation following acquisition of neck surveillance abdominal MRI (mid July 2024).  A copy of this report was sent to the requesting provider on this date.  Electronically Signed: Simonne Come 12/25/2022, 11:34 AM   I spent a total of 5 Minutes in remote  clinical consultation, greater than 50% of which was counseling/coordinating care for post right renal cryoablation.    Visit type: Audio only (telephone). Audio (no video) only due to patient's lack of internet/smartphone capability. Alternative for in-person consultation at [facility], [address] [place], [place], [place]. This visit type was conducted due to national recommendations for restrictions regarding the COVID-19 Pandemic (e.g. social distancing).  This format is felt to be most appropriate for this patient at this time.  All issues noted in this document were  discussed and addressed.

## 2023-04-13 ENCOUNTER — Other Ambulatory Visit: Payer: Self-pay | Admitting: Interventional Radiology

## 2023-04-13 DIAGNOSIS — C641 Malignant neoplasm of right kidney, except renal pelvis: Secondary | ICD-10-CM

## 2023-04-27 ENCOUNTER — Inpatient Hospital Stay: Admission: RE | Admit: 2023-04-27 | Payer: Medicare Other | Source: Ambulatory Visit

## 2023-04-27 ENCOUNTER — Ambulatory Visit
Admission: RE | Admit: 2023-04-27 | Discharge: 2023-04-27 | Disposition: A | Discharge status: Home / Self Care | Payer: Medicare Other | Source: Ambulatory Visit | Attending: Interventional Radiology | Admitting: Interventional Radiology

## 2023-04-27 DIAGNOSIS — C641 Malignant neoplasm of right kidney, except renal pelvis: Secondary | ICD-10-CM

## 2023-04-27 MED ORDER — GADOPICLENOL 0.5 MMOL/ML IV SOLN
10.0000 mL | Freq: Once | INTRAVENOUS | Status: AC | PRN
  Administered 2023-04-27: 10 mL via INTRAVENOUS

## 2023-05-10 ENCOUNTER — Ambulatory Visit
Admission: RE | Admit: 2023-05-10 | Discharge: 2023-05-10 | Disposition: A | Discharge status: Home / Self Care | Payer: Medicare Other | Source: Ambulatory Visit | Attending: Interventional Radiology | Admitting: Interventional Radiology

## 2023-05-10 DIAGNOSIS — C641 Malignant neoplasm of right kidney, except renal pelvis: Secondary | ICD-10-CM

## 2023-05-10 NOTE — Progress Notes (Signed)
Patient ID: Troy [name], male   DOB: 04-09-1946, 77 y.o.   MRN: [id number]        Chief Complaint: Post right sided renal cryoablation (08/30/2022)   Referring Physician(s): Borden,Lester   History of Present Illness: Troy [name] is a 77 y.o. male with past medical history significant for hypertension, anemia, sleep apnea and prostate cancer who was found to have an approximately 1.7 x 1.2 cm partially exophytic enhancing lesion arising from the posterior aspect of the inferior pole of the right kidney on abdominal CT performed 05/24/2022 performed for left lower quadrant abdominal pain at which time the patient was noted to have a high-grade small bowel obstruction (for which the patient ultimately underwent partial small bowel resection and lysis of adhesions).  Additionally, patient was found to have an approximate 1.8 cm enhancing lesion within the left side of the urinary bladder (which was been subsequently removed by Dr. Laverle Patter).     The renal lesion was subsequently evaluated on dedicated abdominal MRI performed 06/25/2022 confirming the presence of an approximately 1.7 x 1.5 cm enhancing lesion within the posterior inferior pole of the right kidney compatible with a renal cell carcinoma for which the patient underwent a technically successful right-sided image guided renal cryoablation on 08/30/2022.     The patient is seen today in postprocedural evaluation and management following acquisition of postprocedural surveillance abdominal MRI performed 04/27/2023.   Patient remains asymptomatic.  Past Medical History:  Diagnosis Date   Abdominal distention    hernia   Anemia    Arthritis    big toes    Cancer (HCC)    prostate    Chronic kidney disease    Hypertension    Lumbar herniated disc    L5   Pneumonia    Rash    yeast infection from bladder sling surgery    Sciatica of right side 09/01/2011   right leg-tx. Tylenol   Sleep apnea    cpap    Past Surgical  History:  Procedure Laterality Date   HERNIA REPAIR  September 01, 2010   INCONTINENCE SURGERY  Jan 31, 2011   INSERTION OF MESH  03/10/2015   Procedure: INSERTION OF MESH;  Surgeon: Luretha Murphy, MD;  Location: [facility];  Service: General;;   IR RADIOLOGIST EVAL & MGMT  05/15/2018   IR RADIOLOGIST EVAL & MGMT  07/13/2022   LAPAROSCOPIC NISSEN FUNDOPLICATION N/A 03/10/2015   Procedure: LAPAROSCOPIC NISSEN FUNDOPLICATION AND HIATAL HERNIA REPAIR;  Surgeon: Luretha Murphy, MD;  Location: [facility];  Service: General;  Laterality: N/A;   LAPAROTOMY N/A 05/29/2022   Procedure: laparoscopic assisted small bowel resection with anastamosis;  Surgeon: Sheliah Hatch, De Blanch, MD;  Location: [facility];  Service: General;  Laterality: N/A;   PROSTATECTOMY  reb 9, 2010   RADIOLOGY WITH ANESTHESIA N/A 08/30/2022   Procedure: RENAL CRYOABLATION;  Surgeon: Simonne Come, MD;  Location: [facility];  Service: Anesthesiology;  Laterality: N/A;   TONSILLECTOMY     TRANSURETHRAL RESECTION OF BLADDER TUMOR N/A 06/15/2022   Procedure: TRANSURETHRAL RESECTION OF BLADDER TUMOR (TURBT)/ CYSTOSCOPY/ BILATERAL RETROGRADE/;  Surgeon: Heloise Purpura, MD;  Location: [facility];  Service: Urology;  Laterality: N/A;  GENERAL ANESTHESIA WITH PARALYSIS   UPPER GI ENDOSCOPY N/A 03/10/2015   Procedure: UPPER GI ENDOSCOPY;  Surgeon: Luretha Murphy, MD;  Location: [facility];  Service: General;  Laterality: N/A;   VENTRAL HERNIA REPAIR  09/06/2011   Procedure: HERNIA REPAIR VENTRAL ADULT;  Surgeon: Valarie Merino, MD;  Location: [facility];  Service: General;  Laterality: N/A;   VENTRAL HERNIA REPAIR  09/06/2011   Procedure: LAPAROSCOPIC VENTRAL HERNIA;  Surgeon: Valarie Merino, MD;  Location: [facility];  Service: General;  Laterality: N/A;    Allergies: Ciprofloxacin and Wound dressings [skin protectants, misc.]  Medications: Prior to Admission medications   Medication Sig Start Date End Date Taking? Authorizing Provider  ALPRAZolam Prudy Feeler) 0.25 MG  tablet Take 0.25 mg by mouth at bedtime as needed for anxiety or sleep.    [provider]  Cholecalciferol (VITAMIN D3) 2000 UNITS TABS Take 2,000 Units by mouth every morning.    [provider]  ezetimibe (ZETIA) 10 MG tablet Take 10 mg by mouth at bedtime.    [provider]  hydrochlorothiazide (HYDRODIURIL) 25 MG tablet Take 25 mg by mouth daily. 05/19/22   [provider]  HYDROcodone-acetaminophen (NORCO) 5-325 MG tablet Take 1 tablet by mouth every 6 (six) hours as needed for moderate pain. 08/30/22   Han, Aimee H, PA-C  Liniments (BLUE-EMU SUPER STRENGTH EX) Apply 1 Application topically daily as needed (pain).    [provider]  loratadine (CLARITIN) 10 MG tablet Take 10 mg by mouth daily as needed for allergies. Started on 06/06/22    [provider]  losartan (COZAAR) 100 MG tablet Take 100 mg by mouth every morning.    [provider]  metoprolol succinate (TOPROL-XL) 50 MG 24 hr tablet Take 50 mg by mouth daily. Take with or immediately following a meal.    [provider]  Multiple Vitamins-Minerals (LUTEIN-ZEAXANTHIN PO) Take 1 tablet by mouth daily.    [provider]  NON FORMULARY Pt uses a cpap nightly    [provider]  OVER THE COUNTER MEDICATION Take 1 capsule by mouth at bedtime as needed (sleep). Somnapure otc sleep aid    [provider]  phenazopyridine (PYRIDIUM) 200 MG tablet Take 1 tablet (200 mg total) by mouth 3 (three) times daily as needed for pain. Patient not taking: Reported on 08/21/2022 06/15/22   Heloise Purpura, MD  predniSONE (DELTASONE) 10 MG tablet 6 tablets first day decrease by 1 tablet each day until finished. 09/22/22   Terrilee Files, MD  rosuvastatin (CRESTOR) 20 MG tablet Take 20 mg by mouth at bedtime.    [provider]  Tetrahydrozoline HCl (VISINE OP) Place 1 drop into both eyes daily as needed (redness).    [provider]   Turmeric (QC TUMERIC COMPLEX PO) Take 1,500 mg by mouth daily.    [provider]     Family History  Problem Relation Age of Onset   Lung cancer Mother    Coronary artery disease Mother    Stroke Father    Diabetes Father    Bladder Cancer Father    Dementia Father    Cervical cancer Sister     Social History   Socioeconomic History   Marital status: Married    Spouse name: Not on file   Number of children: Not on file   Years of education: Not on file   Highest education level: Not on file  Occupational History   Not on file  Tobacco Use   Smoking status: Never   Smokeless tobacco: Never  Vaping Use   Vaping status: Never Used  Substance and Sexual Activity   Alcohol use: Yes    Comment: occ. alcohol   Drug use: No   Sexual activity: Yes  Other Topics Concern  Not on file  Social History Narrative   Not on file   Social Determinants of Health   Financial Resource Strain: Not on file  Food Insecurity: No Food Insecurity (05/24/2022)   Hunger Vital Sign    Worried About Running Out of Food in the Last Year: Never true    Ran Out of Food in the Last Year: Never true  Transportation Needs: No Transportation Needs (05/24/2022)   PRAPARE - Administrator, Civil Service (Medical): No    Lack of Transportation (Non-Medical): No  Physical Activity: Not on file  Stress: Not on file  Social Connections: Not on file    ECOG Status: 0 - Asymptomatic  Review of Systems  Review of Systems: A 12 point ROS discussed and pertinent positives are indicated in the HPI above.  All other systems are negative.  Physical Exam No direct physical exam was performed (except for noted visual exam findings with Video Visits).   Vital Signs: There were no vitals taken for this visit.  Imaging:  The following examinations were reviewed in detail:   Abdominal MRI - 04/27/2023; 12/22/2022; 06/25/2022 CT abdomen pelvis - 05/24/2022 Procedural images during  image guided right renal cryoablation - 08/30/2022   Review of abdominal MRI performed 12/22/2022 demonstrates a technically excellent result without definitive evidence of residual nodular enhancing tissue to suggest residual disease.  MR ABDOMEN WWO CONTRAST  Result Date: 05/03/2023 CLINICAL DATA:  History right renal cell carcinoma status post cryoablation, additional history of bladder cancer EXAM: MRI ABDOMEN WITHOUT AND WITH CONTRAST TECHNIQUE: Multiplanar multisequence MR imaging of the abdomen was performed both before and after the administration of intravenous contrast. CONTRAST:  10 mL Vueway gadolinium contrast IV COMPARISON:  12/22/2022, 06/25/2022 FINDINGS: Lower chest: No acute abnormality. Hepatobiliary: Severe hepatic steatosis. Transient perfusional hyperenhancement of the peripheral right lobe of the liver related to a poorly visualized intrahepatic shunt, benign, requiring no further follow-up or characterization (series 12, image 37). Small gallstones. No gallbladder wall thickening, or biliary dilatation. Pancreas: Unremarkable. No pancreatic ductal dilatation or surrounding inflammatory changes. Spleen: Normal in size without significant abnormality. Adrenals/Urinary Tract: Adrenal glands are unremarkable. Interval resolution of previously seen intrinsic T1 hyperintensity within an ablation site of the inferior pole of the right kidney, now with no significant associated contrast enhancement, and an unchanged halo of fat necrosis (series 11, image 79, series 12, image 79). Small benign fluid signal cyst in the adjacent inferior pole of the right kidney. Simple, benign left renal cortical cysts, requiring no further follow-up or characterization. Kidneys are otherwise normal, without obvious renal calculi, solid lesion, or hydronephrosis. Stomach/Bowel: Stomach is within normal limits. No evidence of bowel wall thickening, distention, or inflammatory changes. Vascular/Lymphatic: No  significant vascular findings are present. No enlarged abdominal lymph nodes. Other: No abdominal wall hernia or abnormality. No ascites. Musculoskeletal: No acute or significant osseous findings. IMPRESSION: 1. Expected interval evolution of an ablation site of the inferior pole of the right kidney, now with no significant associated contrast enhancement, and an unchanged halo of adjacent fat necrosis. No evidence of residual viable tissue. 2. No evidence of lymphadenopathy or metastatic disease in the abdomen. 3. Severe hepatic steatosis. 4. Cholelithiasis. Electronically Signed   By: Jearld Lesch M.D.   On: 05/03/2023 14:56    Labs:  CBC: Recent Labs    06/07/22 0840 08/25/22 0836 08/30/22 0752 09/22/22 0924  WBC 8.2 6.2 6.9 7.3  HGB 13.6 14.1 14.1 14.8  HCT 41.2 42.5 42.0  43.2  PLT 393 285 269 321    COAGS: Recent Labs    08/25/22 0836 08/30/22 0752  INR 1.1 1.0    BMP: Recent Labs    06/07/22 0840 08/25/22 0836 08/30/22 0752 09/22/22 0924  NA 140 141 142 141  K 3.8 3.7 3.4* 3.8  CL 108 108 108 105  CO2 27 29 26 25   GLUCOSE 121* 107* 114* 123*  BUN 19 20 23 22   CALCIUM 9.0 8.9 8.9 9.1  CREATININE 1.01 0.99 1.02 1.06  GFRNONAA >60 >60 >60 >60    LIVER FUNCTION TESTS: Recent Labs    05/24/22 0917 05/25/22 0534 05/30/22 0251 05/31/22 0433  BILITOT 1.5* 1.1 0.6 0.7  AST 14* 15 15 23   ALT 17 15 17 22   ALKPHOS 70 54 47 47  PROT 7.8 6.3* 5.5* 5.5*  ALBUMIN 4.8 3.7 2.7* 2.8*    TUMOR MARKERS: No results for input(s): "AFPTM", "CEA", "CA199", "CHROMGRNA" in the last 8760 hours.  Assessment and Plan:  Troy [name] is a 77 y.o. male with past medical history significant for hypertension, anemia, sleep apnea and prostate cancer who was found to have an approximately 1.7 x 1.2 cm partially exophytic enhancing lesion arising from the posterior aspect of the inferior pole of the right kidney on abdominal CT performed 05/24/2022 for which the patient underwent a  technically successful right-sided image guided renal cryoablation on 08/30/2022.     The following examinations were reviewed in detail: Abdominal MRI - 04/27/2023; 12/22/2022; 06/25/2022 CT abdomen pelvis - 05/24/2022 Procedural images during image guided right renal cryoablation - 08/30/2022   Review of abdominal MRI performed 12/22/2022 demonstrates a technically excellent result without definitive evidence of residual nodular enhancing tissue to suggest residual disease.   Typically, surveillance evaluation occurs every 3 months for the first year followed by every 6 months for the second year with a final surveillance exam performed 3 years following the intervention.    The patient states he is going to undergo knee surgery at the end of this year and as I don't see anything worrisome on the most recent MRI, I think it's reasonable to obtain the next surveillance postprocedural MRI in 6 months (mid February 2025).    The patient demonstrated excellent understanding of the above and is in agreement with the proposed POC.   The patient knows to call the interventional radiology clinic with any interval questions or concerns.  Plan: - Follow-up telemedicine consultation following acquisition of next surveillance abdominal MRI in 6 months (mid Feb 2025).  Thank you for this interesting consult.  I greatly enjoyed meeting Troy [name] and look forward to participating in their care.  A copy of this report was sent to the requesting provider on this date.  Electronically Signed: Simonne Come 05/10/2023, 3:07 PM   I spent a total of 10 Minutes in remote  clinical consultation, greater than 50% of which was counseling/coordinating care for post renal cryoablation.    Visit type: Audio only (telephone). Audio (no video) only due to patient's lack of internet/smartphone capability. Alternative for in-person consultation at [facility], [address] [place], [place], [place]. This visit  type was conducted due to national recommendations for restrictions regarding the COVID-19 Pandemic (e.g. social distancing).  This format is felt to be most appropriate for this patient at this time.  All issues noted in this document were discussed and addressed.

## 2023-05-16 ENCOUNTER — Encounter (HOSPITAL_BASED_OUTPATIENT_CLINIC_OR_DEPARTMENT_OTHER): Payer: Self-pay | Admitting: Physical Therapy

## 2023-05-16 ENCOUNTER — Other Ambulatory Visit: Payer: Self-pay

## 2023-05-16 ENCOUNTER — Ambulatory Visit (HOSPITAL_BASED_OUTPATIENT_CLINIC_OR_DEPARTMENT_OTHER): Payer: Medicare Other | Attending: Physician Assistant | Admitting: Physical Therapy

## 2023-05-16 DIAGNOSIS — G8929 Other chronic pain: Secondary | ICD-10-CM | POA: Insufficient documentation

## 2023-05-16 DIAGNOSIS — R2689 Other abnormalities of gait and mobility: Secondary | ICD-10-CM | POA: Insufficient documentation

## 2023-05-16 DIAGNOSIS — M1712 Unilateral primary osteoarthritis, left knee: Secondary | ICD-10-CM | POA: Diagnosis not present

## 2023-05-16 DIAGNOSIS — M25562 Pain in left knee: Secondary | ICD-10-CM | POA: Diagnosis not present

## 2023-05-16 NOTE — Therapy (Signed)
OUTPATIENT PHYSICAL THERAPY LOWER EXTREMITY EVALUATION   Patient Name: Troy [name] MRN: [id number] DOB:09-02-1946, 77 y.o., male Today's Date: 05/16/2023  END OF SESSION:  PT End of Session - 05/16/23 0825     Visit Number 1    Number of Visits 16    Date for PT Re-Evaluation 07/11/23    PT Start Time 0810    PT Stop Time 0845    PT Time Calculation (min) 35 min    Activity Tolerance Patient tolerated treatment well    Behavior During Therapy [facility] for tasks assessed/performed             Past Medical History:  Diagnosis Date   Abdominal distention    hernia   Anemia    Arthritis    big toes    Cancer (HCC)    prostate    Chronic kidney disease    Hypertension    Lumbar herniated disc    L5   Pneumonia    Rash    yeast infection from bladder sling surgery    Sciatica of right side 09/01/2011   right leg-tx. Tylenol   Sleep apnea    cpap   Past Surgical History:  Procedure Laterality Date   HERNIA REPAIR  September 01, 2010   INCONTINENCE SURGERY  Jan 31, 2011   INSERTION OF MESH  03/10/2015   Procedure: INSERTION OF MESH;  Surgeon: Luretha Murphy, MD;  Location: [facility];  Service: General;;   IR RADIOLOGIST EVAL & MGMT  05/15/2018   IR RADIOLOGIST EVAL & MGMT  07/13/2022   LAPAROSCOPIC NISSEN FUNDOPLICATION N/A 03/10/2015   Procedure: LAPAROSCOPIC NISSEN FUNDOPLICATION AND HIATAL HERNIA REPAIR;  Surgeon: Luretha Murphy, MD;  Location: [facility];  Service: General;  Laterality: N/A;   LAPAROTOMY N/A 05/29/2022   Procedure: laparoscopic assisted small bowel resection with anastamosis;  Surgeon: Sheliah Hatch, De Blanch, MD;  Location: [facility];  Service: General;  Laterality: N/A;   PROSTATECTOMY  reb 9, 2010   RADIOLOGY WITH ANESTHESIA N/A 08/30/2022   Procedure: RENAL CRYOABLATION;  Surgeon: Simonne Come, MD;  Location: [facility];  Service: Anesthesiology;  Laterality: N/A;   TONSILLECTOMY     TRANSURETHRAL RESECTION OF BLADDER TUMOR N/A 06/15/2022   Procedure:  TRANSURETHRAL RESECTION OF BLADDER TUMOR (TURBT)/ CYSTOSCOPY/ BILATERAL RETROGRADE/;  Surgeon: Heloise Purpura, MD;  Location: [facility];  Service: Urology;  Laterality: N/A;  GENERAL ANESTHESIA WITH PARALYSIS   UPPER GI ENDOSCOPY N/A 03/10/2015   Procedure: UPPER GI ENDOSCOPY;  Surgeon: Luretha Murphy, MD;  Location: [facility];  Service: General;  Laterality: N/A;   VENTRAL HERNIA REPAIR  09/06/2011   Procedure: HERNIA REPAIR VENTRAL ADULT;  Surgeon: Valarie Merino, MD;  Location: [facility];  Service: General;  Laterality: N/A;   VENTRAL HERNIA REPAIR  09/06/2011   Procedure: LAPAROSCOPIC VENTRAL HERNIA;  Surgeon: Valarie Merino, MD;  Location: [facility];  Service: General;  Laterality: N/A;   Patient Active Problem List   Diagnosis Date Noted   Renal cell carcinoma of right kidney (HCC) 08/30/2022   SBO (small bowel obstruction) (HCC) 05/24/2022   Bladder mass 05/24/2022   Renal mass 05/24/2022   Anxiety 05/24/2022   HTN (hypertension) 05/24/2022   HLD (hyperlipidemia) 05/24/2022   Primary osteoarthritis of left shoulder 05/05/2020   Diverticulitis 05/03/2018   Status post laparoscopic Nissen fundoplication and repair of large hiatus hernia June 2016 03/10/2015   Ventral hernia-s/p repair x 2 09/07/2011   History of prostate cancer-s/p prostatectomy 09/07/2011  PCP: Bernestine Amass MD   REFERRING PROVIDER: Weston Brass PA   REFERRING DIAG: Left Knee OA   THERAPY DIAG:  Chronic pain of left knee  Other abnormalities of gait and mobility  Rationale for Evaluation and Treatment: Rehabilitation  ONSET DATE: >3 years   SUBJECTIVE:   SUBJECTIVE STATEMENT: Patient has had a progressive increase in the left knee pain over the past 3 years.  He was found to have significant degeneration behind his kneecap.  He has increased pain with standing and walking.  He was active prior to onset of pain.  He reports at one point was walking up to 5 miles a day.  At this point he can walk less than  a half a mile without increased knee pain.  He is scheduled to have surgery in December.  He is going to have a total knee arthroplasty.  PERTINENT HISTORY: Arthritis hips left knee and left shoulder; Herniated lumbar disc; Prostate cancer,  PAIN:  Are you having pain? Yes: NPRS scale: 2/10 at worst  Pain location: left knee  Pain description: aching  Aggravating factors: standing and walking  Relieving factors: rest   PRECAUTIONS: None  RED FLAGS: None   WEIGHT BEARING RESTRICTIONS: No  FALLS:  Has patient fallen in last 6 months? Yes. Number of falls 1 fell outside last weekend; Breaking a branch.   LIVING ENVIRONMENT: 2 steps going into the house   OCCUPATION:  Works three days a week. Works Monday , Tuesday, Thursday   PLOF: Independent  PATIENT GOALS:  Get stronger before the surgery, less pain   NEXT MD VISIT:  October 25th   OBJECTIVE:   DIAGNOSTIC FINDINGS:  Per patient left knee patella degeneration   PATIENT SURVEYS:  FOTO    COGNITION: Overall cognitive status: Within functional limits for tasks assessed     SENSATION: WFL  EDEMA:  No noticeable edema   MUSCLE LENGTH:   POSTURE: No Significant postural limitations  PALPATION: No unexpected TTP   LOWER EXTREMITY ROM:  Passive ROM Right eval Left eval  Hip flexion     Hip extension    Hip abduction    Hip adduction    Hip internal rotation    Hip external rotation    Knee flexion  No pain at end range  Knee extension  -3 from straight no pain   Ankle dorsiflexion    Ankle plantarflexion    Ankle inversion    Ankle eversion     (Blank rows = not tested)  Limited patella motion   LOWER EXTREMITY MMT:  MMT Right eval Left eval  Hip flexion 25.3 28.2  Hip extension    Hip abduction 34.0 33.4  Hip adduction    Hip internal rotation    Hip external rotation    Knee flexion    Knee extension 31.0 34.3  Ankle dorsiflexion    Ankle plantarflexion    Ankle inversion     Ankle eversion     (Blank rows = not tested)   GAIT: Lateral movement away from the left leg  TODAY'S TREATMENT:  DATE:   Access Code: [email or web address] URL: [email or web address] Date: 05/16/2023 Prepared by: Lorayne Bender  Exercises - Supine Active Straight Leg Raise  - 1 x daily - 7 x weekly - 3 sets - 10 reps - Supine Quad Set  - 1 x daily - 7 x weekly - 3 sets - 10 reps - Supine Bridge  - 1 x daily - 7 x weekly - 3 sets - 10 reps - Hooklying Isometric Clamshell  - 1 x daily - 7 x weekly - 3 sets - 10 reps  PATIENT EDUCATION:  Education details: HEP, symptom management  Person educated: Patient Education method: Explanation, Demonstration, Tactile cues, Verbal cues, and Handouts Education comprehension: verbalized understanding, returned demonstration, verbal cues required, tactile cues required, and needs further education  HOME EXERCISE PROGRAM: Access Code: [email or web address] URL: [email or web address] Date: 05/16/2023 Prepared by: Lorayne Bender  Exercises - Supine Active Straight Leg Raise  - 1 x daily - 7 x weekly - 3 sets - 10 reps - Supine Quad Set  - 1 x daily - 7 x weekly - 3 sets - 10 reps - Supine Bridge  - 1 x daily - 7 x weekly - 3 sets - 10 reps - Hooklying Isometric Clamshell  - 1 x daily - 7 x weekly - 3 sets - 10 reps  ASSESSMENT:  CLINICAL IMPRESSION: Patient is a 77 year old male who presents with left knee OA.  He has had a progressive increase in pain over the past 3 years.  He has increased pain when he stands and walks.  At 1 point he was walking over 5 miles.  At this point he cannot walk more than half mile without increased pain.  He recently had a steroid injection  which helped his pain.  He has full pain-free range of motion.  He has limited patellar motion.  He has mild strength deficits in his quad and  glutes.  He presents with antalgic gait.  He is scheduled for total knee replacement in December.  He will like to a pool program and the land program to maximize his strength and mobility prior to his surgery.  He would benefit from skilled therapy to develop this program. OBJECTIVE IMPAIRMENTS: decreased activity tolerance, difficulty walking, decreased ROM, decreased strength, and pain  ACTIVITY LIMITATIONS: standing, squatting, stairs, and locomotion level  PARTICIPATION LIMITATIONS: meal prep, cleaning, laundry, driving, shopping, community activity, occupation, and yard work   PERSONAL FACTORS: 1-2 comorbidities:    are also affecting patient's functional outcome.   REHAB POTENTIAL: Excellent  CLINICAL DECISION MAKING: Stable/uncomplicated  EVALUATION COMPLEXITY: Low   GOALS: Goals reviewed with patient? Yes  SHORT TERM GOALS: Target date: 06/13/2023   Patient will increase gross left lower extremity strength by 5 pounds Baseline: Goal status: INITIAL  2.  Patient will be independent with basic exercise program on land and in the pool. Baseline:  Goal status: INITIAL  3.  Patient will demonstrate improved patella motion Baseline:  Goal stat LONG TERM GOALS: Target date: 07/11/2023    Patient will stand for greater than 1/2-hour without increased pain Baseline:  Goal status: INITIAL  2.  Patient will ambulate community distance with less than 2 out of 10 pain Baseline:  Goal status: INITIAL  3.  Patient will have complete complete prehab program Baseline:  Goal status: INITIAL     PLAN:  PT FREQUENCY: 1-2x/week  PT DURATION: 8 weeks  PLANNED INTERVENTIONS: Therapeutic exercises, Therapeutic activity, Neuromuscular re-education, Balance training, Gait training, Patient/Family education, Self  Care, Joint mobilization, Stair training, DME instructions, Aquatic Therapy, Dry Needling, Electrical stimulation, Cryotherapy, Moist heat, Taping, Manual therapy, and  Re-evaluation.   PLAN FOR NEXT SESSION:  Land: Consider patella range of motion.  Review HEP.  Consider extension stretching.  Consider standing series including heel raises, supported march, hip abduction bilateral.  Consider gym equipment at some point if patient can tolerate. Aquatics: Progressive weightbearing activities.  Gait training.  Hip and calf strengthening.   Dessie Coma, PT 05/16/2023, 10:27 AM

## 2023-05-23 ENCOUNTER — Ambulatory Visit (HOSPITAL_BASED_OUTPATIENT_CLINIC_OR_DEPARTMENT_OTHER): Payer: Medicare Other | Admitting: Physical Therapy

## 2023-05-23 ENCOUNTER — Encounter (HOSPITAL_BASED_OUTPATIENT_CLINIC_OR_DEPARTMENT_OTHER): Payer: Self-pay | Admitting: Physical Therapy

## 2023-05-23 DIAGNOSIS — G8929 Other chronic pain: Secondary | ICD-10-CM | POA: Diagnosis not present

## 2023-05-23 DIAGNOSIS — R2689 Other abnormalities of gait and mobility: Secondary | ICD-10-CM

## 2023-05-23 NOTE — Therapy (Signed)
OUTPATIENT PHYSICAL THERAPY LOWER EXTREMITY  TREATMENT   Patient Name: Troy [name] MRN: [id number] DOB:Apr 19, 1946, 77 y.o., male Today's Date: 05/23/2023  END OF SESSION:  PT End of Session - 05/23/23 1700     Visit Number 2    Number of Visits 16    Date for PT Re-Evaluation 07/11/23    PT Start Time 1700    PT Stop Time 1745    PT Time Calculation (min) 45 min    Activity Tolerance Patient tolerated treatment well    Behavior During Therapy [facility] for tasks assessed/performed             Past Medical History:  Diagnosis Date   Abdominal distention    hernia   Anemia    Arthritis    big toes    Cancer (HCC)    prostate    Chronic kidney disease    Hypertension    Lumbar herniated disc    L5   Pneumonia    Rash    yeast infection from bladder sling surgery    Sciatica of right side 09/01/2011   right leg-tx. Tylenol   Sleep apnea    cpap   Past Surgical History:  Procedure Laterality Date   HERNIA REPAIR  September 01, 2010   INCONTINENCE SURGERY  Jan 31, 2011   INSERTION OF MESH  03/10/2015   Procedure: INSERTION OF MESH;  Surgeon: Luretha Murphy, MD;  Location: [facility];  Service: General;;   IR RADIOLOGIST EVAL & MGMT  05/15/2018   IR RADIOLOGIST EVAL & MGMT  07/13/2022   LAPAROSCOPIC NISSEN FUNDOPLICATION N/A 03/10/2015   Procedure: LAPAROSCOPIC NISSEN FUNDOPLICATION AND HIATAL HERNIA REPAIR;  Surgeon: Luretha Murphy, MD;  Location: [facility];  Service: General;  Laterality: N/A;   LAPAROTOMY N/A 05/29/2022   Procedure: laparoscopic assisted small bowel resection with anastamosis;  Surgeon: Sheliah Hatch, De Blanch, MD;  Location: [facility];  Service: General;  Laterality: N/A;   PROSTATECTOMY  reb 9, 2010   RADIOLOGY WITH ANESTHESIA N/A 08/30/2022   Procedure: RENAL CRYOABLATION;  Surgeon: Simonne Come, MD;  [facility]: [facility];  Service: Anesthesiology;  Laterality: N/A;   TONSILLECTOMY     TRANSURETHRAL RESECTION OF BLADDER TUMOR N/A 06/15/2022   Procedure:  TRANSURETHRAL RESECTION OF BLADDER TUMOR (TURBT)/ CYSTOSCOPY/ BILATERAL RETROGRADE/;  Surgeon: Heloise Purpura, MD;  Location: [facility];  Service: Urology;  Laterality: N/A;  GENERAL ANESTHESIA WITH PARALYSIS   UPPER GI ENDOSCOPY N/A 03/10/2015   Procedure: UPPER GI ENDOSCOPY;  Surgeon: Luretha Murphy, MD;  [facility];  Service: General;  Laterality: N/A;   VENTRAL HERNIA REPAIR  09/06/2011   Procedure: HERNIA REPAIR VENTRAL ADULT;  Surgeon: Valarie Merino, MD;  [facility];  Service: General;  Laterality: N/A;   VENTRAL HERNIA REPAIR  09/06/2011   Procedure: LAPAROSCOPIC VENTRAL HERNIA;  Surgeon: Valarie Merino, MD;  [facility];  Service: General;  Laterality: N/A;   Patient Active Problem List   Diagnosis Date Noted   Renal cell carcinoma of right kidney (HCC) 08/30/2022   SBO (small bowel obstruction) (HCC) 05/24/2022   Bladder mass 05/24/2022   Renal mass 05/24/2022   Anxiety 05/24/2022   HTN (hypertension) 05/24/2022   HLD (hyperlipidemia) 05/24/2022   Primary osteoarthritis of left shoulder 05/05/2020   Diverticulitis 05/03/2018   Status post laparoscopic Nissen fundoplication and repair of large hiatus hernia June 2016 03/10/2015   Ventral hernia-s/p repair x 2 09/07/2011   History of prostate cancer-s/p prostatectomy 09/07/2011  PCP: Bernestine Amass MD   REFERRING PROVIDER: Weston Brass PA   REFERRING DIAG: Left Knee OA   THERAPY DIAG:  Chronic pain of left knee  Other abnormalities of gait and mobility  Rationale for Evaluation and Treatment: Rehabilitation  ONSET DATE: >3 years   SUBJECTIVE:   SUBJECTIVE STATEMENT: Left knee is not hurting since injection last week, but now my right hip seems to be sore 1/10  Initial subjective Patient has had a progressive increase in the left knee pain over the past 3 years.  He was found to have significant degeneration behind his kneecap.  He has increased pain with standing and walking.  He was active  prior to onset of pain.  He reports at one point was walking up to 5 miles a day.  At this point he can walk less than a half a mile without increased knee pain.  He is scheduled to have surgery in December.  He is going to have a total knee arthroplasty.  PERTINENT HISTORY: Arthritis hips left knee and left shoulder; Herniated lumbar disc; Prostate cancer,  PAIN:  Are you having pain? Yes: NPRS scale: 0/10 at worst  Pain location: left knee  Pain description: aching  Aggravating factors: standing and walking  Relieving factors: rest   PRECAUTIONS: None  RED FLAGS: None   WEIGHT BEARING RESTRICTIONS: No  FALLS:  Has patient fallen in last 6 months? Yes. Number of falls 1 fell outside last weekend; Breaking a branch.   LIVING ENVIRONMENT: 2 steps going into the house   OCCUPATION:  Works three days a week. Works Monday , Tuesday, Thursday   PLOF: Independent  PATIENT GOALS:  Get stronger before the surgery, less pain   NEXT MD VISIT:  October 25th   OBJECTIVE:   DIAGNOSTIC FINDINGS:  Per patient left knee patella degeneration   PATIENT SURVEYS:  FOTO    COGNITION: Overall cognitive status: Within functional limits for tasks assessed     SENSATION: WFL  EDEMA:  No noticeable edema   MUSCLE LENGTH:   POSTURE: No Significant postural limitations  PALPATION: No unexpected TTP   LOWER EXTREMITY ROM:  Passive ROM Right eval Left eval  Hip flexion     Hip extension    Hip abduction    Hip adduction    Hip internal rotation    Hip external rotation    Knee flexion  No pain at end range  Knee extension  -3 from straight no pain   Ankle dorsiflexion    Ankle plantarflexion    Ankle inversion    Ankle eversion     (Blank rows = not tested)  Limited patella motion   LOWER EXTREMITY MMT:  MMT Right eval Left eval  Hip flexion 25.3 28.2  Hip extension    Hip abduction 34.0 33.4  Hip adduction    Hip internal rotation    Hip external rotation     Knee flexion    Knee extension 31.0 34.3  Ankle dorsiflexion    Ankle plantarflexion    Ankle inversion    Ankle eversion     (Blank rows = not tested)   GAIT: Lateral movement away from the left leg  TODAY'S TREATMENT:  DATE:   Pt seen for aquatic therapy today.  Treatment took place in water 3.5-4.75 ft in depth at the [facility] pool. Temp of water was 91.  Pt entered/exited the pool via stairs using step to pattern up, step through pattern down with hand rail.  *Intro to setting *walking forward, back and side stepping x 4 widths ea 4.0 ft *Forward walking with knee kicks x 2 width *HB carry forward and back yellow HB x 2 widths *3 way stretch standing using noodle: hamstring; gastroc, adductor; IT band *Standing ue support on yellow HB: DF; PF; relaxed squats; high knee marching; hip abd/add  - ue support wall: relaxed squat; hip extension *Hip flex and quad stretch using noodle. Step ups leading R/L x 10 *Stair negotiation step to pattern *walking between activities for recovery throughout session  Pt requires the buoyancy and hydrostatic pressure of water for support, and to offload joints by unweighting joint load by at least 50 % in navel deep water and by at least 75-80% in chest to neck deep water.  Viscosity of the water is needed for resistance of strengthening. Water current perturbations provides challenge to standing balance requiring increased core activation.    PATIENT EDUCATION:  Education details: HEP, symptom management  Person educated: Patient Education method: Explanation, Demonstration, Tactile cues, Verbal cues, and Handouts Education comprehension: verbalized understanding, returned demonstration, verbal cues required, tactile cues required, and needs further education  HOME EXERCISE PROGRAM: Access Code:  [email or web address] URL: [email or web address] Date: 05/16/2023 Prepared by: Lorayne Bender  Exercises - Supine Active Straight Leg Raise  - 1 x daily - 7 x weekly - 3 sets - 10 reps - Supine Quad Set  - 1 x daily - 7 x weekly - 3 sets - 10 reps - Supine Bridge  - 1 x daily - 7 x weekly - 3 sets - 10 reps - Hooklying Isometric Clamshell  - 1 x daily - 7 x weekly - 3 sets - 10 reps  ASSESSMENT:  CLINICAL IMPRESSION: Pt demonstrates safety and indep in setting with therapist instructing from deck.  He reports low to no pain in knee since injection although some slight discomfort in right hip likely due to gait offloading away from left knee.  He tolerates aquatic session without difficulty.  No pain reports throughout session.  Able to stretch hip and le complex well using noodles.  He is a good candidate for aquatic therapy and will benefit from the properties of water to progress towards land based goals.   Initial Impression Patient is a 77 year old male who presents with left knee OA.  He has had a progressive increase in pain over the past 3 years.  He has increased pain when he stands and walks.  At 1 point he was walking over 5 miles.  At this point he cannot walk more than half mile without increased pain.  He recently had a steroid injection  which helped his pain.  He has full pain-free range of motion.  He has limited patellar motion.  He has mild strength deficits in his quad and glutes.  He presents with antalgic gait.  He is scheduled for total knee replacement in December.  He will like to a pool program and the land program to maximize his strength and mobility prior to his surgery.  He would benefit from skilled therapy to develop this program. OBJECTIVE IMPAIRMENTS: decreased activity tolerance, difficulty walking, decreased ROM, decreased strength, and pain  ACTIVITY LIMITATIONS: standing, squatting, stairs,  and locomotion level  PARTICIPATION LIMITATIONS: meal prep,  cleaning, laundry, driving, shopping, community activity, occupation, and yard work   PERSONAL FACTORS: 1-2 comorbidities:    are also affecting patient's functional outcome.   REHAB POTENTIAL: Excellent  CLINICAL DECISION MAKING: Stable/uncomplicated  EVALUATION COMPLEXITY: Low   GOALS: Goals reviewed with patient? Yes  SHORT TERM GOALS: Target date: 06/13/2023   Patient will increase gross left lower extremity strength by 5 pounds Baseline: Goal status: INITIAL  2.  Patient will be independent with basic exercise program on land and in the pool. Baseline:  Goal status: INITIAL  3.  Patient will demonstrate improved patella motion Baseline:  Goal stat LONG TERM GOALS: Target date: 07/11/2023    Patient will stand for greater than 1/2-hour without increased pain Baseline:  Goal status: INITIAL  2.  Patient will ambulate community distance with less than 2 out of 10 pain Baseline:  Goal status: INITIAL  3.  Patient will have complete complete prehab program Baseline:  Goal status: INITIAL     PLAN:  PT FREQUENCY: 1-2x/week  PT DURATION: 8 weeks  PLANNED INTERVENTIONS: Therapeutic exercises, Therapeutic activity, Neuromuscular re-education, Balance training, Gait training, Patient/Family education, Self Care, Joint mobilization, Stair training, DME instructions, Aquatic Therapy, Dry Needling, Electrical stimulation, Cryotherapy, Moist heat, Taping, Manual therapy, and Re-evaluation.   PLAN FOR NEXT SESSION:  Land: Consider patella range of motion.  Review HEP.  Consider extension stretching.  Consider standing series including heel raises, supported march, hip abduction bilateral.  Consider gym equipment at some point if patient can tolerate. Aquatics: Progressive weightbearing activities.  Gait training.  Hip and calf strengthening.   Charidy Cappelletti Tomma Lightning) Branden Vine MPT 05/23/2023, 5:01 PM

## 2023-05-25 ENCOUNTER — Ambulatory Visit (HOSPITAL_BASED_OUTPATIENT_CLINIC_OR_DEPARTMENT_OTHER): Payer: Medicare Other | Admitting: Physical Therapy

## 2023-05-25 ENCOUNTER — Encounter (HOSPITAL_BASED_OUTPATIENT_CLINIC_OR_DEPARTMENT_OTHER): Payer: Self-pay | Admitting: Physical Therapy

## 2023-05-25 DIAGNOSIS — G8929 Other chronic pain: Secondary | ICD-10-CM

## 2023-05-25 DIAGNOSIS — R2689 Other abnormalities of gait and mobility: Secondary | ICD-10-CM

## 2023-05-25 NOTE — Therapy (Signed)
OUTPATIENT PHYSICAL THERAPY LOWER EXTREMITY  TREATMENT   Patient Name: Troy [name] MRN: [id number] DOB:[birth year], 77 y.o., male Today's Date: 05/25/2023  END OF SESSION:  PT End of Session - 05/25/23 1026     Visit Number 3    Number of Visits 16    Date for PT Re-Evaluation 07/11/23    PT Start Time 1026    PT Stop Time 1108    PT Time Calculation (min) 42 min             Past Medical History:  Diagnosis Date   Abdominal distention    hernia   Anemia    Arthritis    big toes    Cancer (HCC)    prostate    Chronic kidney disease    Hypertension    Lumbar herniated disc    L5   Pneumonia    Rash    yeast infection from bladder sling surgery    Sciatica of right side 09/01/2011   right leg-tx. Tylenol   Sleep apnea    cpap   Past Surgical History:  Procedure Laterality Date   HERNIA REPAIR  September 01, 2010   INCONTINENCE SURGERY  Jan 31, 2011   INSERTION OF MESH  03/10/2015   Procedure: INSERTION OF MESH;  Surgeon: Luretha Murphy, MD;  Location: [facility];  Service: General;;   IR RADIOLOGIST EVAL & MGMT  05/15/2018   IR RADIOLOGIST EVAL & MGMT  07/13/2022   LAPAROSCOPIC NISSEN FUNDOPLICATION N/A 03/10/2015   Procedure: LAPAROSCOPIC NISSEN FUNDOPLICATION AND HIATAL HERNIA REPAIR;  Surgeon: Luretha Murphy, MD;  [facility]: [facility];  Service: General;  Laterality: N/A;   LAPAROTOMY N/A 05/29/2022   Procedure: laparoscopic assisted small bowel resection with anastamosis;  Surgeon: Sheliah Hatch, De Blanch, MD;  Location: [facility];  Service: General;  Laterality: N/A;   PROSTATECTOMY  reb 9, 2010   RADIOLOGY WITH ANESTHESIA N/A 08/30/2022   Procedure: RENAL CRYOABLATION;  Surgeon: Simonne Come, MD;  [facility];  Service: Anesthesiology;  Laterality: N/A;   TONSILLECTOMY     TRANSURETHRAL RESECTION OF BLADDER TUMOR N/A 06/15/2022   Procedure: TRANSURETHRAL RESECTION OF BLADDER TUMOR (TURBT)/ CYSTOSCOPY/ BILATERAL RETROGRADE/;  Surgeon: Heloise Purpura, MD;  [facility];  Service: Urology;  Laterality: N/A;  GENERAL ANESTHESIA WITH PARALYSIS   UPPER GI ENDOSCOPY N/A 03/10/2015   Procedure: UPPER GI ENDOSCOPY;  Surgeon: Luretha Murphy, MD;  [facility];  Service: General;  Laterality: N/A;   VENTRAL HERNIA REPAIR  09/06/2011   Procedure: HERNIA REPAIR VENTRAL ADULT;  Surgeon: Valarie Merino, MD;  [facility];  Service: General;  Laterality: N/A;   VENTRAL HERNIA REPAIR  09/06/2011   Procedure: LAPAROSCOPIC VENTRAL HERNIA;  Surgeon: Valarie Merino, MD;  [facility];  Service: General;  Laterality: N/A;   Patient Active Problem List   Diagnosis Date Noted   Renal cell carcinoma of right kidney (HCC) 08/30/2022   SBO (small bowel obstruction) (HCC) 05/24/2022   Bladder mass 05/24/2022   Renal mass 05/24/2022   Anxiety 05/24/2022   HTN (hypertension) 05/24/2022   HLD (hyperlipidemia) 05/24/2022   Primary osteoarthritis of left shoulder 05/05/2020   Diverticulitis 05/03/2018   Status post laparoscopic Nissen fundoplication and repair of large hiatus hernia June 2016 03/10/2015   Ventral hernia-s/p repair x 2 09/07/2011   History of prostate cancer-s/p prostatectomy 09/07/2011    PCP: Bernestine Amass MD   REFERRING PROVIDER: Weston Brass PA   REFERRING DIAG: Left Knee  OA   THERAPY DIAG:  Chronic pain of left knee  Other abnormalities of gait and mobility  Rationale for Evaluation and Treatment: Rehabilitation  ONSET DATE: >3 years   SUBJECTIVE:   SUBJECTIVE STATEMENT: Left knee is not hurting since injection 2 weeks ago, but now my right hip hurts when walking.  "I know I have a limp".   Initial subjective Patient has had a progressive increase in the left knee pain over the past 3 years.  He was found to have significant degeneration behind his kneecap.  He has increased pain with standing and walking.  He was active prior to onset of pain.  He reports at one point was walking up to 5 miles a day.  At this point he can  walk less than a half a mile without increased knee pain.  He is scheduled to have surgery in December.  He is going to have a total knee arthroplasty.  PERTINENT HISTORY: Arthritis hips left knee and left shoulder; Herniated lumbar disc; Prostate cancer,  PAIN:  Are you having pain? Yes: NPRS scale: 0/10 at rest, 2-3/10 in Rt hip with walking Pain location: see above  Pain description: aching  Aggravating factors: standing and walking  Relieving factors: rest   PRECAUTIONS: None  RED FLAGS: None   WEIGHT BEARING RESTRICTIONS: No  FALLS:  Has patient fallen in last 6 months? Yes. Number of falls 1 fell outside last weekend; Breaking a branch.   LIVING ENVIRONMENT: 2 steps going into the house   OCCUPATION:  Works three days a week. Works Monday , Tuesday, Thursday   PLOF: Independent  PATIENT GOALS:  Get stronger before the surgery, less pain   NEXT MD VISIT:  October 25th  Surgery Dec 17   OBJECTIVE:   DIAGNOSTIC FINDINGS:  Per patient left knee patella degeneration   PATIENT SURVEYS:  FOTO    COGNITION: Overall cognitive status: Within functional limits for tasks assessed     SENSATION: WFL  EDEMA:  No noticeable edema   MUSCLE LENGTH:   POSTURE: No Significant postural limitations  PALPATION: No unexpected TTP   LOWER EXTREMITY ROM:  Passive ROM Right eval Left eval  Hip flexion     Hip extension    Hip abduction    Hip adduction    Hip internal rotation    Hip external rotation    Knee flexion  No pain at end range  Knee extension  -3 from straight no pain   Ankle dorsiflexion    Ankle plantarflexion    Ankle inversion    Ankle eversion     (Blank rows = not tested)  Limited patella motion   LOWER EXTREMITY MMT:  MMT Right eval Left eval  Hip flexion 25.3 28.2  Hip extension    Hip abduction 34.0 33.4  Hip adduction    Hip internal rotation    Hip external rotation    Knee flexion    Knee extension 31.0 34.3  Ankle  dorsiflexion    Ankle plantarflexion    Ankle inversion    Ankle eversion     (Blank rows = not tested)   GAIT: Lateral movement away from the left leg  TODAY'S TREATMENT:  DATE:   Pt seen for aquatic therapy today.  Treatment took place in water 3.5-4.75 ft in depth at the [facility] pool. Temp of water was 91.  Pt entered/exited the pool via stairs using step to pattern up, step through pattern down with hand rail.  In 4 ft area: *walking forward, backward with cues for heel/toe and arm swing  *  side stepping x 2 laps with low and high step height * holding wall:  Hip abdct/ addct x 10; leg swings into hip flex/ext x 10 each ; heel raises x 10; hamstring curls - alternating LEs *with UE on yellow hand floats: Forward walking with knee kicks x 2 laps *Forward and backward marching with yellow hand floats under water at sides x 2 widths * UE on yellow hand floats:  3 way toe tap x5 reps each LE;  tandem stance with horiz head turns x 20s each * without support: tandem gait forward / backward x 2 laps * quad stretch with foot on 2nd step (RLE), 3rd step (LLE) x 15s x 2 each;  L/R hamstring stretch with foot on 2nd step x 15s x 2 each   Pt requires the buoyancy and hydrostatic pressure of water for support, and to offload joints by unweighting joint load by at least 50 % in navel deep water and by at least 75-80% in chest to neck deep water.  Viscosity of the water is needed for resistance of strengthening. Water current perturbations provides challenge to standing balance requiring increased core activation.    PATIENT EDUCATION:  Education details: HEP, symptom management  Person educated: Patient Education method: Explanation, Demonstration, Tactile cues, Verbal cues, and Handouts Education comprehension: verbalized understanding, returned  demonstration, verbal cues required, tactile cues required, and needs further education  HOME EXERCISE PROGRAM: Access Code: [email or web address] URL: [email or web address] Date: 05/16/2023 Prepared by: Lorayne Bender  Exercises - Supine Active Straight Leg Raise  - 1 x daily - 7 x weekly - 3 sets - 10 reps - Supine Quad Set  - 1 x daily - 7 x weekly - 3 sets - 10 reps - Supine Bridge  - 1 x daily - 7 x weekly - 3 sets - 10 reps - Hooklying Isometric Clamshell  - 1 x daily - 7 x weekly - 3 sets - 10 reps  ASSESSMENT:  CLINICAL IMPRESSION: Pt demonstrated decreased balance in Lt SLS and tandem gait;  and increased tightness in RLE with stretches.  He continued to be a good candidate for aquatic therapy and will benefit from the properties of water to progress towards land based goals. Goals are ongoing.    Initial Impression Patient is a 77 year old male who presents with left knee OA.  He has had a progressive increase in pain over the past 3 years.  He has increased pain when he stands and walks.  At 1 point he was walking over 5 miles.  At this point he cannot walk more than half mile without increased pain.  He recently had a steroid injection  which helped his pain.  He has full pain-free range of motion.  He has limited patellar motion.  He has mild strength deficits in his quad and glutes.  He presents with antalgic gait.  He is scheduled for total knee replacement in December.  He will like to a pool program and the land program to maximize his strength and mobility prior to his surgery.  He would benefit from skilled therapy to develop this  program. OBJECTIVE IMPAIRMENTS: decreased activity tolerance, difficulty walking, decreased ROM, decreased strength, and pain  ACTIVITY LIMITATIONS: standing, squatting, stairs, and locomotion level  PARTICIPATION LIMITATIONS: meal prep, cleaning, laundry, driving, shopping, community activity, occupation, and yard work   PERSONAL FACTORS: 1-2  comorbidities:    are also affecting patient's functional outcome.   REHAB POTENTIAL: Excellent  CLINICAL DECISION MAKING: Stable/uncomplicated  EVALUATION COMPLEXITY: Low   GOALS: Goals reviewed with patient? Yes  SHORT TERM GOALS: Target date: 06/13/2023   Patient will increase gross left lower extremity strength by 5 pounds Baseline: Goal status: INITIAL  2.  Patient will be independent with basic exercise program on land and in the pool. Baseline:  Goal status: INITIAL  3.  Patient will demonstrate improved patella motion Baseline:  Goal stat LONG TERM GOALS: Target date: 07/11/2023    Patient will stand for greater than 1/2-hour without increased pain Baseline:  Goal status: INITIAL  2.  Patient will ambulate community distance with less than 2 out of 10 pain Baseline:  Goal status: INITIAL  3.  Patient will have complete complete prehab program Baseline:  Goal status: INITIAL     PLAN:  PT FREQUENCY: 1-2x/week  PT DURATION: 8 weeks  PLANNED INTERVENTIONS: Therapeutic exercises, Therapeutic activity, Neuromuscular re-education, Balance training, Gait training, Patient/Family education, Self Care, Joint mobilization, Stair training, DME instructions, Aquatic Therapy, Dry Needling, Electrical stimulation, Cryotherapy, Moist heat, Taping, Manual therapy, and Re-evaluation.   PLAN FOR NEXT SESSION:  Land: Consider patella range of motion.  Review HEP.  Consider extension stretching.  Consider standing series including heel raises, supported march, hip abduction bilateral.  Consider gym equipment at some point if patient can tolerate. Aquatics: Progressive weightbearing activities.  Gait training.  Hip and calf strengthening.  Mayer Camel, PTA 05/25/23 11:14 AM [facility] [address] [place], [place], [address] Phone: [phone]   Fax:  [phone]

## 2023-05-31 ENCOUNTER — Encounter (HOSPITAL_BASED_OUTPATIENT_CLINIC_OR_DEPARTMENT_OTHER): Payer: Medicare Other | Admitting: Physical Therapy

## 2023-06-06 ENCOUNTER — Encounter (HOSPITAL_BASED_OUTPATIENT_CLINIC_OR_DEPARTMENT_OTHER): Payer: Self-pay | Admitting: Physical Therapy

## 2023-06-06 ENCOUNTER — Ambulatory Visit (HOSPITAL_BASED_OUTPATIENT_CLINIC_OR_DEPARTMENT_OTHER): Payer: Medicare Other | Admitting: Physical Therapy

## 2023-06-06 DIAGNOSIS — G8929 Other chronic pain: Secondary | ICD-10-CM

## 2023-06-06 DIAGNOSIS — R2689 Other abnormalities of gait and mobility: Secondary | ICD-10-CM

## 2023-06-06 NOTE — Therapy (Signed)
OUTPATIENT PHYSICAL THERAPY LOWER EXTREMITY  TREATMENT   Patient Name: Troy [name] MRN: [id number] DOB:07/22/1946, 77 y.o., male Today's Date: 06/06/2023  END OF SESSION:  PT End of Session - 06/06/23 1811     Visit Number 4    Number of Visits 16    Date for PT Re-Evaluation 07/11/23    PT Start Time 1716    PT Stop Time 1759    PT Time Calculation (min) 43 min    Activity Tolerance No increased pain    Behavior During Therapy [facility] for tasks assessed/performed              Past Medical History:  Diagnosis Date   Abdominal distention    hernia   Anemia    Arthritis    big toes    Cancer (HCC)    prostate    Chronic kidney disease    Hypertension    Lumbar herniated disc    L5   Pneumonia    Rash    yeast infection from bladder sling surgery    Sciatica of right side 09/01/2011   right leg-tx. Tylenol   Sleep apnea    cpap   Past Surgical History:  Procedure Laterality Date   HERNIA REPAIR  September 01, 2010   INCONTINENCE SURGERY  Jan 31, 2011   INSERTION OF MESH  03/10/2015   Procedure: INSERTION OF MESH;  Surgeon: Luretha Murphy, MD;  Location: [facility];  Service: General;;   IR RADIOLOGIST EVAL & MGMT  05/15/2018   IR RADIOLOGIST EVAL & MGMT  07/13/2022   LAPAROSCOPIC NISSEN FUNDOPLICATION N/A 03/10/2015   Procedure: LAPAROSCOPIC NISSEN FUNDOPLICATION AND HIATAL HERNIA REPAIR;  Surgeon: Luretha Murphy, MD;  Location: [facility];  Service: General;  Laterality: N/A;   LAPAROTOMY N/A 05/29/2022   Procedure: laparoscopic assisted small bowel resection with anastamosis;  Surgeon: Sheliah Hatch, De Blanch, MD;  Location: [facility];  Service: General;  Laterality: N/A;   PROSTATECTOMY  reb 9, 2010   RADIOLOGY WITH ANESTHESIA N/A 08/30/2022   Procedure: RENAL CRYOABLATION;  Surgeon: Simonne Come, MD;  [facility]: [facility];  Service: Anesthesiology;  Laterality: N/A;   TONSILLECTOMY     TRANSURETHRAL RESECTION OF BLADDER TUMOR N/A 06/15/2022   Procedure: TRANSURETHRAL RESECTION  OF BLADDER TUMOR (TURBT)/ CYSTOSCOPY/ BILATERAL RETROGRADE/;  Surgeon: Heloise Purpura, MD;  Location: [facility];  Service: Urology;  Laterality: N/A;  GENERAL ANESTHESIA WITH PARALYSIS   UPPER GI ENDOSCOPY N/A 03/10/2015   Procedure: UPPER GI ENDOSCOPY;  Surgeon: Luretha Murphy, MD;  [facility];  Service: General;  Laterality: N/A;   VENTRAL HERNIA REPAIR  09/06/2011   Procedure: HERNIA REPAIR VENTRAL ADULT;  Surgeon: Valarie Merino, MD;  [facility];  Service: General;  Laterality: N/A;   VENTRAL HERNIA REPAIR  09/06/2011   Procedure: LAPAROSCOPIC VENTRAL HERNIA;  Surgeon: Valarie Merino, MD;  [facility];  Service: General;  Laterality: N/A;   Patient Active Problem List   Diagnosis Date Noted   Renal cell carcinoma of right kidney (HCC) 08/30/2022   SBO (small bowel obstruction) (HCC) 05/24/2022   Bladder mass 05/24/2022   Renal mass 05/24/2022   Anxiety 05/24/2022   HTN (hypertension) 05/24/2022   HLD (hyperlipidemia) 05/24/2022   Primary osteoarthritis of left shoulder 05/05/2020   Diverticulitis 05/03/2018   Status post laparoscopic Nissen fundoplication and repair of large hiatus hernia June 2016 03/10/2015   Ventral hernia-s/p repair x 2 09/07/2011   History of prostate cancer-s/p prostatectomy 09/07/2011  PCP: Bernestine Amass MD   REFERRING PROVIDER: Weston Brass PA   REFERRING DIAG: Left Knee OA   THERAPY DIAG:  Chronic pain of left knee  Other abnormalities of gait and mobility  Rationale for Evaluation and Treatment: Rehabilitation  ONSET DATE: >3 years   SUBJECTIVE:   SUBJECTIVE STATEMENT: Left knee does not hurt".   Initial subjective Patient has had a progressive increase in the left knee pain over the past 3 years.  He was found to have significant degeneration behind his kneecap.  He has increased pain with standing and walking.  He was active prior to onset of pain.  He reports at one point was walking up to 5 miles a day.  At this  point he can walk less than a half a mile without increased knee pain.  He is scheduled to have surgery in December.  He is going to have a total knee arthroplasty.  PERTINENT HISTORY: Arthritis hips left knee and left shoulder; Herniated lumbar disc; Prostate cancer,  PAIN:  Are you having pain? Yes: NPRS scale: 0/10 at rest, 2-3/10 in Rt hip with walking Pain location: see above  Pain description: aching  Aggravating factors: standing and walking  Relieving factors: rest   PRECAUTIONS: None  RED FLAGS: None   WEIGHT BEARING RESTRICTIONS: No  FALLS:  Has patient fallen in last 6 months? Yes. Number of falls 1 fell outside last weekend; Breaking a branch.   LIVING ENVIRONMENT: 2 steps going into the house   OCCUPATION:  Works three days a week. Works Monday , Tuesday, Thursday   PLOF: Independent  PATIENT GOALS:  Get stronger before the surgery, less pain   NEXT MD VISIT:  October 25th  Surgery Dec 17   OBJECTIVE:   DIAGNOSTIC FINDINGS:  Per patient left knee patella degeneration   PATIENT SURVEYS:  FOTO    COGNITION: Overall cognitive status: Within functional limits for tasks assessed     SENSATION: WFL  EDEMA:  No noticeable edema   MUSCLE LENGTH:   POSTURE: No Significant postural limitations  PALPATION: No unexpected TTP   LOWER EXTREMITY ROM:  Passive ROM Right eval Left eval  Hip flexion     Hip extension    Hip abduction    Hip adduction    Hip internal rotation    Hip external rotation    Knee flexion  No pain at end range  Knee extension  -3 from straight no pain   Ankle dorsiflexion    Ankle plantarflexion    Ankle inversion    Ankle eversion     (Blank rows = not tested)  Limited patella motion   LOWER EXTREMITY MMT:  MMT Right eval Left eval  Hip flexion 25.3 28.2  Hip extension    Hip abduction 34.0 33.4  Hip adduction    Hip internal rotation    Hip external rotation    Knee flexion    Knee extension 31.0  34.3  Ankle dorsiflexion    Ankle plantarflexion    Ankle inversion    Ankle eversion     (Blank rows = not tested)   GAIT: Lateral movement away from the left leg  TODAY'S TREATMENT:  DATE:   Pt seen for aquatic therapy today.  Treatment took place in water 3.5-4.75 ft in depth at the [facility] pool. Temp of water was 91.  Pt entered/exited the pool via stairs using step to pattern up, step through pattern down with hand rail.  In 4 ft area: *walking forward, backward with cues for heel/toe and arm swing  *  side stepping x 4 laps with high step height *side lunge with yellow HB shoulder abd/add *TrA set thick yellow noodle  wide stance then staggered x 5 - 8 reps ea * holding wall-> yellow HB:  squats x 10; Hip abdct/ addct x 10; leg swings into hip flex/ext x 10 each ; heel raises x 10;  toe raises x 10 hamstring curls - alternating Les *Balance: tandem stance 4.0 ft holding leading R/L x 15s after several tries unsupported; SLS R x 15s; left x 10 s best after several tries. Cues for core stabilization *Hamstring and gastroc stretching steps R x3-4 L x 2  Pt requires the buoyancy and hydrostatic pressure of water for support, and to offload joints by unweighting joint load by at least 50 % in navel deep water and by at least 75-80% in chest to neck deep water.  Viscosity of the water is needed for resistance of strengthening. Water current perturbations provides challenge to standing balance requiring increased core activation.    PATIENT EDUCATION:  Education details: HEP, symptom management  Person educated: Patient Education method: Explanation, Demonstration, Tactile cues, Verbal cues, and Handouts Education comprehension: verbalized understanding, returned demonstration, verbal cues required, tactile cues required, and needs further  education  HOME EXERCISE PROGRAM: Access Code: [email or web address] URL: [email or web address] Date: 05/16/2023 Prepared by: Lorayne Bender  Exercises - Supine Active Straight Leg Raise  - 1 x daily - 7 x weekly - 3 sets - 10 reps - Supine Quad Set  - 1 x daily - 7 x weekly - 3 sets - 10 reps - Supine Bridge  - 1 x daily - 7 x weekly - 3 sets - 10 reps - Hooklying Isometric Clamshell  - 1 x daily - 7 x weekly - 3 sets - 10 reps  Hamstring and gastroc stretching on step  ASSESSMENT:  CLINICAL IMPRESSION: Spent extra time on lt SLS balance and stretching of RLE (noted problem areas from last note). Advanced core strengthening progressing to yellow noodle with TrA sets. He reports 0/10 pain since steroid injection. Good effort throughout session.  Pt instructed to continue with hamstring and gastroc stretching at home.     Initial Impression Patient is a 77 year old male who presents with left knee OA.  He has had a progressive increase in pain over the past 3 years.  He has increased pain when he stands and walks.  At 1 point he was walking over 5 miles.  At this point he cannot walk more than half mile without increased pain.  He recently had a steroid injection  which helped his pain.  He has full pain-free range of motion.  He has limited patellar motion.  He has mild strength deficits in his quad and glutes.  He presents with antalgic gait.  He is scheduled for total knee replacement in December.  He will like to a pool program and the land program to maximize his strength and mobility prior to his surgery.  He would benefit from skilled therapy to develop this program. OBJECTIVE IMPAIRMENTS: decreased activity tolerance, difficulty walking, decreased ROM, decreased strength, and pain  ACTIVITY LIMITATIONS: standing, squatting, stairs, and locomotion level  PARTICIPATION LIMITATIONS: meal prep, cleaning, laundry, driving, shopping, community activity, occupation, and yard work    PERSONAL FACTORS: 1-2 comorbidities:    are also affecting patient's functional outcome.   REHAB POTENTIAL: Excellent  CLINICAL DECISION MAKING: Stable/uncomplicated  EVALUATION COMPLEXITY: Low   GOALS: Goals reviewed with patient? Yes  SHORT TERM GOALS: Target date: 06/13/2023   Patient will increase gross left lower extremity strength by 5 pounds Baseline: Goal status: INITIAL  2.  Patient will be independent with basic exercise program on land and in the pool. Baseline:  Goal status: INITIAL  3.  Patient will demonstrate improved patella motion Baseline:  Goal stat LONG TERM GOALS: Target date: 07/11/2023    Patient will stand for greater than 1/2-hour without increased pain Baseline:  Goal status: INITIAL  2.  Patient will ambulate community distance with less than 2 out of 10 pain Baseline:  Goal status: INITIAL  3.  Patient will have complete complete prehab program Baseline:  Goal status: INITIAL     PLAN:  PT FREQUENCY: 1-2x/week  PT DURATION: 8 weeks  PLANNED INTERVENTIONS: Therapeutic exercises, Therapeutic activity, Neuromuscular re-education, Balance training, Gait training, Patient/Family education, Self Care, Joint mobilization, Stair training, DME instructions, Aquatic Therapy, Dry Needling, Electrical stimulation, Cryotherapy, Moist heat, Taping, Manual therapy, and Re-evaluation.   PLAN FOR NEXT SESSION:  Land: Consider patella range of motion.  Review HEP.  Consider extension stretching.  Consider standing series including heel raises, supported march, hip abduction bilateral.  Consider gym equipment at some point if patient can tolerate. Aquatics: Progressive weightbearing activities.  Gait training.  Hip and calf strengthening.  [address] [place]) Stace Peace MPT 06/06/23 6:12 PM [facility] [address] [place], [place], [address] Phone: [phone]   Fax:  [phone]

## 2023-06-13 ENCOUNTER — Ambulatory Visit (HOSPITAL_BASED_OUTPATIENT_CLINIC_OR_DEPARTMENT_OTHER): Payer: Medicare Other | Attending: Physician Assistant | Admitting: Physical Therapy

## 2023-06-13 ENCOUNTER — Encounter (HOSPITAL_BASED_OUTPATIENT_CLINIC_OR_DEPARTMENT_OTHER): Payer: Self-pay | Admitting: Physical Therapy

## 2023-06-13 DIAGNOSIS — R2689 Other abnormalities of gait and mobility: Secondary | ICD-10-CM | POA: Insufficient documentation

## 2023-06-13 DIAGNOSIS — M25562 Pain in left knee: Secondary | ICD-10-CM | POA: Insufficient documentation

## 2023-06-13 DIAGNOSIS — G8929 Other chronic pain: Secondary | ICD-10-CM | POA: Insufficient documentation

## 2023-06-13 NOTE — Therapy (Signed)
OUTPATIENT PHYSICAL THERAPY LOWER EXTREMITY  TREATMENT   Patient Name: Troy [name] MRN: [id number] DOB:12-26-1945, 77 y.o., male Today's Date: 06/13/2023  END OF SESSION:  PT End of Session - 06/13/23 1029     Visit Number 5    Number of Visits 16    Date for PT Re-Evaluation 07/11/23    PT Start Time 1030    PT Stop Time 1110    PT Time Calculation (min) 40 min    Activity Tolerance Patient tolerated treatment well    Behavior During Therapy [facility] for tasks assessed/performed              Past Medical History:  Diagnosis Date   Abdominal distention    hernia   Anemia    Arthritis    big toes    Cancer (HCC)    prostate    Chronic kidney disease    Hypertension    Lumbar herniated disc    L5   Pneumonia    Rash    yeast infection from bladder sling surgery    Sciatica of right side 09/01/2011   right leg-tx. Tylenol   Sleep apnea    cpap   Past Surgical History:  Procedure Laterality Date   HERNIA REPAIR  September 01, 2010   INCONTINENCE SURGERY  Jan 31, 2011   INSERTION OF MESH  03/10/2015   Procedure: INSERTION OF MESH;  Surgeon: Luretha Murphy, MD;  Location: [facility];  Service: General;;   IR RADIOLOGIST EVAL & MGMT  05/15/2018   IR RADIOLOGIST EVAL & MGMT  07/13/2022   LAPAROSCOPIC NISSEN FUNDOPLICATION N/A 03/10/2015   Procedure: LAPAROSCOPIC NISSEN FUNDOPLICATION AND HIATAL HERNIA REPAIR;  Surgeon: Luretha Murphy, MD;  [facility];  Service: General;  Laterality: N/A;   LAPAROTOMY N/A 05/29/2022   Procedure: laparoscopic assisted small bowel resection with anastamosis;  Surgeon: Sheliah Hatch, De Blanch, MD;  Location: [facility];  Service: General;  Laterality: N/A;   PROSTATECTOMY  reb 9, 2010   RADIOLOGY WITH ANESTHESIA N/A 08/30/2022   Procedure: RENAL CRYOABLATION;  Surgeon: Simonne Come, MD;  [facility];  Service: Anesthesiology;  Laterality: N/A;   TONSILLECTOMY     TRANSURETHRAL RESECTION OF BLADDER TUMOR N/A 06/15/2022   Procedure:  TRANSURETHRAL RESECTION OF BLADDER TUMOR (TURBT)/ CYSTOSCOPY/ BILATERAL RETROGRADE/;  Surgeon: Heloise Purpura, MD;  [facility];  Service: Urology;  Laterality: N/A;  GENERAL ANESTHESIA WITH PARALYSIS   UPPER GI ENDOSCOPY N/A 03/10/2015   Procedure: UPPER GI ENDOSCOPY;  Surgeon: Luretha Murphy, MD;  [facility];  Service: General;  Laterality: N/A;   VENTRAL HERNIA REPAIR  09/06/2011   Procedure: HERNIA REPAIR VENTRAL ADULT;  Surgeon: Valarie Merino, MD;  [facility];  Service: General;  Laterality: N/A;   VENTRAL HERNIA REPAIR  09/06/2011   Procedure: LAPAROSCOPIC VENTRAL HERNIA;  Surgeon: Valarie Merino, MD;  [facility];  Service: General;  Laterality: N/A;   Patient Active Problem List   Diagnosis Date Noted   Renal cell carcinoma of right kidney (HCC) 08/30/2022   SBO (small bowel obstruction) (HCC) 05/24/2022   Bladder mass 05/24/2022   Renal mass 05/24/2022   Anxiety 05/24/2022   HTN (hypertension) 05/24/2022   HLD (hyperlipidemia) 05/24/2022   Primary osteoarthritis of left shoulder 05/05/2020   Diverticulitis 05/03/2018   Status post laparoscopic Nissen fundoplication and repair of large hiatus hernia June 2016 03/10/2015   Ventral hernia-s/p repair x 2 09/07/2011   History of prostate cancer-s/p prostatectomy 09/07/2011  PCP: Bernestine Amass MD   REFERRING PROVIDER: Weston Brass PA   REFERRING DIAG: Left Knee OA   THERAPY DIAG:  Chronic pain of left knee  Other abnormalities of gait and mobility  Rationale for Evaluation and Treatment: Rehabilitation  ONSET DATE: >3 years   SUBJECTIVE:   SUBJECTIVE STATEMENT: Right knee and hip hurt worse than the Lt knee.  The steroid injection helped.   Pt is scheduled to have TKR surgery in December.  PERTINENT HISTORY: Arthritis hips left knee and left shoulder; Herniated lumbar disc; Prostate cancer,  PAIN:  Are you having pain? Yes: NPRS scale: 1-2/10 Rt hip and knee  Pain location: see  above  Pain description: aching  Aggravating factors: standing and walking  Relieving factors: rest   PRECAUTIONS: None  RED FLAGS: None   WEIGHT BEARING RESTRICTIONS: No  FALLS:  Has patient fallen in last 6 months? Yes. Number of falls 1 fell outside last weekend; Breaking a branch.   LIVING ENVIRONMENT: 2 steps going into the house   OCCUPATION:  Works three days a week. Works Monday , Tuesday, Thursday   PLOF: Independent  PATIENT GOALS:  Get stronger before the surgery, less pain   NEXT MD VISIT:  October 25th  Surgery Dec 17   OBJECTIVE:   DIAGNOSTIC FINDINGS:  Per patient left knee patella degeneration   PATIENT SURVEYS:  FOTO    COGNITION: Overall cognitive status: Within functional limits for tasks assessed     SENSATION: WFL  EDEMA:  No noticeable edema   MUSCLE LENGTH:   POSTURE: No Significant postural limitations  PALPATION: No unexpected TTP   LOWER EXTREMITY ROM:  Passive ROM Right eval Left eval  Hip flexion     Hip extension    Hip abduction    Hip adduction    Hip internal rotation    Hip external rotation    Knee flexion  No pain at end range  Knee extension  -3 from straight no pain   Ankle dorsiflexion    Ankle plantarflexion    Ankle inversion    Ankle eversion     (Blank rows = not tested)  Limited patella motion   LOWER EXTREMITY MMT:  MMT Right eval Left eval  Hip flexion 25.3 28.2  Hip extension    Hip abduction 34.0 33.4  Hip adduction    Hip internal rotation    Hip external rotation    Knee flexion    Knee extension 31.0 34.3  Ankle dorsiflexion    Ankle plantarflexion    Ankle inversion    Ankle eversion     (Blank rows = not tested)   GAIT: Lateral movement away from the left leg  TODAY'S TREATMENT:                                                                                                                              DATE:   Pt seen for aquatic therapy today.  Treatment took place  in water 3.5-4.75 ft in depth at the [facility] pool. Temp of water was 91.  Pt entered/exited the pool via stairs using step to pattern up, step through pattern down with hand rail.  In 4 ft area: *walking forward, backward with cues for heel/toe and arm swing - 3 laps *  side stepping x 4 laps with yellow hand floats under water, with intermittent abdct/addct * UE support on yellow hand floats: tandem stance x 20s each, then tandem gait forward/backward; leg swings into hip flex/ext x 10 each ; heel /toe raises x 10  *TrA set thick yellow noodle  wide stance then staggered x 5 - 8 reps ea * holding wall: hamstring curls - alternating LEs; hip abdct/ addct x 10  * high knee marching forward  *quad stretch with foot on 3rd step x 20s x 2; hamstring stretch with foot on 2nd step x 20s x 2; bilat/single gastroc stretch with heels off step  Pt requires the buoyancy and hydrostatic pressure of water for support, and to offload joints by unweighting joint load by at least 50 % in navel deep water and by at least 75-80% in chest to neck deep water.  Viscosity of the water is needed for resistance of strengthening. Water current perturbations provides challenge to standing balance requiring increased core activation.    PATIENT EDUCATION:  Education details: aquatic therapy exercise progressions/modifications  Person educated: Patient Education method: Explanation, Demonstration, Tactile cues, Verbal cues, and Handouts Education comprehension: verbalized understanding, returned demonstration, verbal cues required, tactile cues required, and needs further education  HOME EXERCISE PROGRAM: Access Code: [email or web address] URL: [email or web address] Date: 05/16/2023 Prepared by: Lorayne Bender  Exercises - Supine Active Straight Leg Raise  - 1 x daily - 7 x weekly - 3 sets - 10 reps - Supine Quad Set  - 1 x daily - 7 x weekly - 3 sets - 10 reps - Supine Bridge  - 1 x daily - 7 x  weekly - 3 sets - 10 reps - Hooklying Isometric Clamshell  - 1 x daily - 7 x weekly - 3 sets - 10 reps  Hamstring and gastroc stretching on step  ASSESSMENT:  CLINICAL IMPRESSION: Pt tolerated all exercises well without production of symptoms.  SLS and tandem stance exercises provide good balance challenge.  Rt hamstring appears tighter than LLE.  Will update land HEP next visit , and continue to progress aquatic exercises as tolerated.  Discussed possibility of joining facility to continue exercising outside of therapy sessions.  Goals are ongoing. Will plan to check STG next visit.     Initial Impression Patient is a 77 year old male who presents with left knee OA.  He has had a progressive increase in pain over the past 3 years.  He has increased pain when he stands and walks.  At 1 point he was walking over 5 miles.  At this point he cannot walk more than half mile without increased pain.  He recently had a steroid injection  which helped his pain.  He has full pain-free range of motion.  He has limited patellar motion.  He has mild strength deficits in his quad and glutes.  He presents with antalgic gait.  He is scheduled for total knee replacement in December.  He will like to a pool program and the land program to maximize his strength and mobility prior to his surgery.  He would benefit from skilled therapy to develop this program.  OBJECTIVE  IMPAIRMENTS: decreased activity tolerance, difficulty walking, decreased ROM, decreased strength, and pain  ACTIVITY LIMITATIONS: standing, squatting, stairs, and locomotion level  PARTICIPATION LIMITATIONS: meal prep, cleaning, laundry, driving, shopping, community activity, occupation, and yard work   PERSONAL FACTORS: 1-2 comorbidities:    are also affecting patient's functional outcome.   REHAB POTENTIAL: Excellent  CLINICAL DECISION MAKING: Stable/uncomplicated  EVALUATION COMPLEXITY: Low   GOALS: Goals reviewed with patient?  Yes  SHORT TERM GOALS: Target date: 06/13/2023   Patient will increase gross left lower extremity strength by 5 pounds Baseline: Goal status: INITIAL  2.  Patient will be independent with basic exercise program on land and in the pool. Baseline:  Goal status: INITIAL  3.  Patient will demonstrate improved patella motion Baseline:  Goal stat LONG TERM GOALS: Target date: 07/11/2023    Patient will stand for greater than 1/2-hour without increased pain Baseline:  Goal status: INITIAL  2.  Patient will ambulate community distance with less than 2 out of 10 pain Baseline:  Goal status: INITIAL  3.  Patient will have complete complete prehab program Baseline:  Goal status: INITIAL     PLAN:  PT FREQUENCY: 1-2x/week  PT DURATION: 8 weeks  PLANNED INTERVENTIONS: Therapeutic exercises, Therapeutic activity, Neuromuscular re-education, Balance training, Gait training, Patient/Family education, Self Care, Joint mobilization, Stair training, DME instructions, Aquatic Therapy, Dry Needling, Electrical stimulation, Cryotherapy, Moist heat, Taping, Manual therapy, and Re-evaluation.   PLAN FOR NEXT SESSION:  Land: Consider patella range of motion.  Review HEP.  Consider extension stretching.  Consider standing series including heel raises, supported march, hip abduction bilateral.  Consider gym equipment at some point if patient can tolerate. Aquatics: Progressive weightbearing activities.  Gait training.  Hip and calf strengthening.  Mayer Camel, PTA 06/13/23 1:17 PM [facility] [address] [place], [place], [address] Phone: [phone]   Fax:  [phone]

## 2023-06-15 ENCOUNTER — Encounter (HOSPITAL_BASED_OUTPATIENT_CLINIC_OR_DEPARTMENT_OTHER): Payer: Self-pay | Admitting: Physical Therapy

## 2023-06-15 ENCOUNTER — Ambulatory Visit (HOSPITAL_BASED_OUTPATIENT_CLINIC_OR_DEPARTMENT_OTHER): Payer: Medicare Other | Admitting: Physical Therapy

## 2023-06-15 DIAGNOSIS — M25562 Pain in left knee: Secondary | ICD-10-CM | POA: Diagnosis not present

## 2023-06-15 DIAGNOSIS — G8929 Other chronic pain: Secondary | ICD-10-CM

## 2023-06-15 DIAGNOSIS — R2689 Other abnormalities of gait and mobility: Secondary | ICD-10-CM

## 2023-06-15 NOTE — Therapy (Signed)
OUTPATIENT PHYSICAL THERAPY LOWER EXTREMITY  TREATMENT   Patient Name: Troy [name] MRN: [id number] DOB:[birth year], 77 y.o., male Today's Date: 06/15/2023  END OF SESSION:  PT End of Session - 06/15/23 0947     Visit Number 6    Number of Visits 16    Date for PT Re-Evaluation 07/11/23    PT Start Time 0948    PT Stop Time 1028    PT Time Calculation (min) 40 min    Activity Tolerance Patient tolerated treatment well    Behavior During Therapy [facility] for tasks assessed/performed              Past Medical History:  Diagnosis Date   Abdominal distention    hernia   Anemia    Arthritis    big toes    Cancer (HCC)    prostate    Chronic kidney disease    Hypertension    Lumbar herniated disc    L5   Pneumonia    Rash    yeast infection from bladder sling surgery    Sciatica of right side 09/01/2011   right leg-tx. Tylenol   Sleep apnea    cpap   Past Surgical History:  Procedure Laterality Date   HERNIA REPAIR  September 01, 2010   INCONTINENCE SURGERY  Jan 31, 2011   INSERTION OF MESH  03/10/2015   Procedure: INSERTION OF MESH;  Surgeon: Luretha Murphy, MD;  Location: [facility];  Service: General;;   IR RADIOLOGIST EVAL & MGMT  05/15/2018   IR RADIOLOGIST EVAL & MGMT  07/13/2022   LAPAROSCOPIC NISSEN FUNDOPLICATION N/A 03/10/2015   Procedure: LAPAROSCOPIC NISSEN FUNDOPLICATION AND HIATAL HERNIA REPAIR;  Surgeon: Luretha Murphy, MD;  [facility];  Service: General;  Laterality: N/A;   LAPAROTOMY N/A 05/29/2022   Procedure: laparoscopic assisted small bowel resection with anastamosis;  Surgeon: Sheliah Hatch, De Blanch, MD;  [facility];  Service: General;  Laterality: N/A;   PROSTATECTOMY  reb 9, 2010   RADIOLOGY WITH ANESTHESIA N/A 08/30/2022   Procedure: RENAL CRYOABLATION;  Surgeon: Simonne Come, MD;  [facility];  Service: Anesthesiology;  Laterality: N/A;   TONSILLECTOMY     TRANSURETHRAL RESECTION OF BLADDER TUMOR N/A 06/15/2022   Procedure:  TRANSURETHRAL RESECTION OF BLADDER TUMOR (TURBT)/ CYSTOSCOPY/ BILATERAL RETROGRADE/;  Surgeon: Heloise Purpura, MD;  [facility];  Service: Urology;  Laterality: N/A;  GENERAL ANESTHESIA WITH PARALYSIS   UPPER GI ENDOSCOPY N/A 03/10/2015   Procedure: UPPER GI ENDOSCOPY;  Surgeon: Luretha Murphy, MD;  [facility];  Service: General;  Laterality: N/A;   VENTRAL HERNIA REPAIR  09/06/2011   Procedure: HERNIA REPAIR VENTRAL ADULT;  Surgeon: Valarie Merino, MD;  [facility];  Service: General;  Laterality: N/A;   VENTRAL HERNIA REPAIR  09/06/2011   Procedure: LAPAROSCOPIC VENTRAL HERNIA;  Surgeon: Valarie Merino, MD;  [facility];  Service: General;  Laterality: N/A;   Patient Active Problem List   Diagnosis Date Noted   Renal cell carcinoma of right kidney (HCC) 08/30/2022   SBO (small bowel obstruction) (HCC) 05/24/2022   Bladder mass 05/24/2022   Renal mass 05/24/2022   Anxiety 05/24/2022   HTN (hypertension) 05/24/2022   HLD (hyperlipidemia) 05/24/2022   Primary osteoarthritis of left shoulder 05/05/2020   Diverticulitis 05/03/2018   Status post laparoscopic Nissen fundoplication and repair of large hiatus hernia June 2016 03/10/2015   Ventral hernia-s/p repair x 2 09/07/2011   History of prostate cancer-s/p prostatectomy 09/07/2011  PCP: Bernestine Amass MD   REFERRING PROVIDER: Weston Brass PA   REFERRING DIAG: Left Knee OA   THERAPY DIAG:  Chronic pain of left knee  Other abnormalities of gait and mobility  Rationale for Evaluation and Treatment: Rehabilitation  ONSET DATE: >3 years   SUBJECTIVE:   SUBJECTIVE STATEMENT: Pt reports no changes since last visit.    Pt is scheduled to have TKR surgery in December.  PERTINENT HISTORY: Arthritis hips left knee and left shoulder; Herniated lumbar disc; Prostate cancer,  PAIN:  Are you having pain? Yes: NPRS scale: 2/10 Rt hip and Lt knee   Pain location: see above  Pain description: aching   Aggravating factors: standing and walking  Relieving factors: rest   PRECAUTIONS: None  RED FLAGS: None   WEIGHT BEARING RESTRICTIONS: No  FALLS:  Has patient fallen in last 6 months? Yes. Number of falls 1 fell outside last weekend; Breaking a branch.   LIVING ENVIRONMENT: 2 steps going into the house   OCCUPATION:  Works three days a week. Works Monday , Tuesday, Thursday   PLOF: Independent  PATIENT GOALS:  Get stronger before the surgery, less pain   NEXT MD VISIT:  October 25th  Surgery Dec 17   OBJECTIVE:   DIAGNOSTIC FINDINGS:  Per patient left knee patella degeneration   PATIENT SURVEYS:  FOTO    COGNITION: Overall cognitive status: Within functional limits for tasks assessed     SENSATION: WFL  EDEMA:  No noticeable edema   MUSCLE LENGTH:   POSTURE: No Significant postural limitations  PALPATION: No unexpected TTP   LOWER EXTREMITY ROM:  Passive ROM Right eval Left eval  Hip flexion     Hip extension    Hip abduction    Hip adduction    Hip internal rotation    Hip external rotation    Knee flexion  No pain at end range  Knee extension  -3 from straight no pain   Ankle dorsiflexion    Ankle plantarflexion    Ankle inversion    Ankle eversion     (Blank rows = not tested)  Limited patella motion   LOWER EXTREMITY MMT:  MMT Right eval Left eval Right  10/4 Left  10/4  Hip flexion 25.3 28.2 38.7 39.6  Hip extension      Hip abduction 34.0 33.4 35.3 36.1  Hip adduction      Hip internal rotation      Hip external rotation      Knee flexion      Knee extension 31.0 34.3 31.6 36.1  Ankle dorsiflexion      Ankle plantarflexion      Ankle inversion      Ankle eversion       (Blank rows = not tested)   GAIT: Lateral movement away from the left leg  TODAY'S TREATMENT:  DATE:    10/4 MMT *NuStep L4: 3.5 min LE only * supine SLR with 5 sec hold at top, x 5 each LE * bridge x 10, 3 sec hold in hip ext * resisted side stepping with green band at thighs x 19ft R/L ->  red band at ankles x 67ft R/L  * STS from chair x 3 -> elevated table in staggered stance  * quad stretch with foot on chair * seated quad stretch with single foot under chair    Pt seen for aquatic therapy today.  Treatment took place in water 3.5-4.75 ft in depth at the [facility] pool. Temp of water was 91.  Pt entered/exited the pool via stairs using step to pattern up, step through pattern down with hand rail.  In 4 ft area: *walking forward, backward with cues for heel/toe and arm swing - 3 laps *  side stepping x 4 laps with yellow hand floats under water, with intermittent abdct/addct * UE support on yellow hand floats: tandem stance x 20s each, then tandem gait forward/backward; leg swings into hip flex/ext x 10 each ; heel /toe raises x 10  *TrA set thick yellow noodle  wide stance then staggered x 5 - 8 reps ea * holding wall: hamstring curls - alternating LEs; hip abdct/ addct x 10  * high knee marching forward  *quad stretch with foot on 3rd step x 20s x 2; hamstring stretch with foot on 2nd step x 20s x 2; bilat/single gastroc stretch with heels off step  Pt requires the buoyancy and hydrostatic pressure of water for support, and to offload joints by unweighting joint load by at least 50 % in navel deep water and by at least 75-80% in chest to neck deep water.  Viscosity of the water is needed for resistance of strengthening. Water current perturbations provides challenge to standing balance requiring increased core activation.    PATIENT EDUCATION:  Education details: aquatic therapy exercise progressions/modifications  Person educated: Patient Education method: Explanation, Demonstration, Tactile cues, Verbal cues, and Handouts Education comprehension: verbalized  understanding, returned demonstration, verbal cues required, tactile cues required, and needs further education  HOME EXERCISE PROGRAM:  Access Code: [email or web address] URL: [email or web address] Date: 06/15/2023 Prepared by: [facility] - [address]  Exercises - Supine Active Straight Leg Raise  - 1 x daily - 7 x weekly - 2 sets - 10 reps - Supine Bridge  - 1 x daily - 7 x weekly - 2 sets - 10 reps - Side Stepping with Resistance at Feet  - 1 x daily - 7 x weekly - 2 sets - 10 reps - Seated Knee Extension with Resistance  - 1 x daily - 7 x weekly - 2 sets - 10 reps - Staggered Sit-to-Stand  - 1 x daily - 7 x weekly - 1 sets - 10 reps - Quadricep Stretch with Chair and Counter Support  - 1 x daily - 7 x weekly - 2-3 reps - 20 seconds  hold  ASSESSMENT:  CLINICAL IMPRESSION: Reviewed and progressed Land HEP today with good tolerance.  He reported mild discomfort in thighs with SLR and STS.  He demonstrated difficulty rising and returning from standard chair without use of UE.  Improved ability to control descent on elevated surface.  Issued updated HEP with red/green band.  Pt has partially met STG 1 with improved LE strength.      Initial Impression Patient is a 77 year old male who presents with left  knee OA.  He has had a progressive increase in pain over the past 3 years.  He has increased pain when he stands and walks.  At 1 point he was walking over 5 miles.  At this point he cannot walk more than half mile without increased pain.  He recently had a steroid injection  which helped his pain.  He has full pain-free range of motion.  He has limited patellar motion.  He has mild strength deficits in his quad and glutes.  He presents with antalgic gait.  He is scheduled for total knee replacement in December.  He will like to a pool program and the land program to maximize his strength and mobility prior to his surgery.  He would benefit from skilled therapy to develop  this program.  OBJECTIVE IMPAIRMENTS: decreased activity tolerance, difficulty walking, decreased ROM, decreased strength, and pain  ACTIVITY LIMITATIONS: standing, squatting, stairs, and locomotion level  PARTICIPATION LIMITATIONS: meal prep, cleaning, laundry, driving, shopping, community activity, occupation, and yard work   PERSONAL FACTORS: 1-2 comorbidities:    are also affecting patient's functional outcome.   REHAB POTENTIAL: Excellent  CLINICAL DECISION MAKING: Stable/uncomplicated  EVALUATION COMPLEXITY: Low   GOALS: Goals reviewed with patient? Yes  SHORT TERM GOALS: Target date: 06/13/2023   Patient will increase gross left lower extremity strength by 5 pounds Baseline: see chart above Goal status:Partially met - 06/15/23  2.  Patient will be independent with basic exercise program on land and in the pool. Baseline:  Goal status: INITIAL  3.  Patient will demonstrate improved patella motion Baseline:  Goal stat  INITIAL   LONG TERM GOALS: Target date: 07/11/2023    Patient will stand for greater than 1/2-hour without increased pain Baseline:  Goal status: INITIAL  2.  Patient will ambulate community distance with less than 2 out of 10 pain Baseline:  Goal status: INITIAL  3.  Patient will have complete complete prehab program Baseline:  Goal status: INITIAL     PLAN:  PT FREQUENCY: 1-2x/week  PT DURATION: 8 weeks  PLANNED INTERVENTIONS: Therapeutic exercises, Therapeutic activity, Neuromuscular re-education, Balance training, Gait training, Patient/Family education, Self Care, Joint mobilization, Stair training, DME instructions, Aquatic Therapy, Dry Needling, Electrical stimulation, Cryotherapy, Moist heat, Taping, Manual therapy, and Re-evaluation.   PLAN FOR NEXT SESSION:  Land: Consider patella range of motion.  Review HEP.  Consider extension stretching.  Consider standing series including heel raises, supported march, hip abduction  bilateral.  Consider gym equipment at some point if patient can tolerate. Aquatics: Progressive weightbearing activities.  Gait training.  Hip and calf strengthening.  Mayer Camel, PTA 06/15/23 12:50 PM [facility] [address] [place], [place], [address] Phone: [phone]   Fax:  [phone]

## 2023-06-20 ENCOUNTER — Ambulatory Visit (HOSPITAL_BASED_OUTPATIENT_CLINIC_OR_DEPARTMENT_OTHER): Payer: Medicare Other | Admitting: Physical Therapy

## 2023-06-20 ENCOUNTER — Encounter (HOSPITAL_BASED_OUTPATIENT_CLINIC_OR_DEPARTMENT_OTHER): Payer: Self-pay | Admitting: Physical Therapy

## 2023-06-20 DIAGNOSIS — M25562 Pain in left knee: Secondary | ICD-10-CM | POA: Diagnosis not present

## 2023-06-20 DIAGNOSIS — G8929 Other chronic pain: Secondary | ICD-10-CM

## 2023-06-20 DIAGNOSIS — R2689 Other abnormalities of gait and mobility: Secondary | ICD-10-CM

## 2023-06-20 NOTE — Therapy (Signed)
OUTPATIENT PHYSICAL THERAPY LOWER EXTREMITY  TREATMENT   Patient Name: Troy [name] MRN: [id number] DOB:11-30-1945, 77 y.o., male Today's Date: 06/20/2023  END OF SESSION:  PT End of Session - 06/20/23 1238     Visit Number 7    Number of Visits 16    Date for PT Re-Evaluation 07/11/23    PT Start Time 1032    PT Stop Time 1115    PT Time Calculation (min) 43 min    Activity Tolerance Patient tolerated treatment well    Behavior During Therapy [facility] for tasks assessed/performed               Past Medical History:  Diagnosis Date   Abdominal distention    hernia   Anemia    Arthritis    big toes    Cancer (HCC)    prostate    Chronic kidney disease    Hypertension    Lumbar herniated disc    L5   Pneumonia    Rash    yeast infection from bladder sling surgery    Sciatica of right side 09/01/2011   right leg-tx. Tylenol   Sleep apnea    cpap   Past Surgical History:  Procedure Laterality Date   HERNIA REPAIR  September 01, 2010   INCONTINENCE SURGERY  Jan 31, 2011   INSERTION OF MESH  03/10/2015   Procedure: INSERTION OF MESH;  Surgeon: Luretha Murphy, MD;  [facility];  Service: General;;   IR RADIOLOGIST EVAL & MGMT  05/15/2018   IR RADIOLOGIST EVAL & MGMT  07/13/2022   LAPAROSCOPIC NISSEN FUNDOPLICATION N/A 03/10/2015   Procedure: LAPAROSCOPIC NISSEN FUNDOPLICATION AND HIATAL HERNIA REPAIR;  Surgeon: Luretha Murphy, MD;  [facility];  Service: General;  Laterality: N/A;   LAPAROTOMY N/A 05/29/2022   Procedure: laparoscopic assisted small bowel resection with anastamosis;  Surgeon: Sheliah Hatch, De Blanch, MD;  [facility];  Service: General;  Laterality: N/A;   PROSTATECTOMY  reb 9, 2010   RADIOLOGY WITH ANESTHESIA N/A 08/30/2022   Procedure: RENAL CRYOABLATION;  Surgeon: Simonne Come, MD;  [facility];  Service: Anesthesiology;  Laterality: N/A;   TONSILLECTOMY     TRANSURETHRAL RESECTION OF BLADDER TUMOR N/A 06/15/2022   Procedure:  TRANSURETHRAL RESECTION OF BLADDER TUMOR (TURBT)/ CYSTOSCOPY/ BILATERAL RETROGRADE/;  Surgeon: Heloise Purpura, MD;  [facility];  Service: Urology;  Laterality: N/A;  GENERAL ANESTHESIA WITH PARALYSIS   UPPER GI ENDOSCOPY N/A 03/10/2015   Procedure: UPPER GI ENDOSCOPY;  Surgeon: Luretha Murphy, MD;  [facility];  Service: General;  Laterality: N/A;   VENTRAL HERNIA REPAIR  09/06/2011   Procedure: HERNIA REPAIR VENTRAL ADULT;  Surgeon: Valarie Merino, MD;  [facility];  Service: General;  Laterality: N/A;   VENTRAL HERNIA REPAIR  09/06/2011   Procedure: LAPAROSCOPIC VENTRAL HERNIA;  Surgeon: Valarie Merino, MD;  [facility];  Service: General;  Laterality: N/A;   Patient Active Problem List   Diagnosis Date Noted   Renal cell carcinoma of right kidney (HCC) 08/30/2022   SBO (small bowel obstruction) (HCC) 05/24/2022   Bladder mass 05/24/2022   Renal mass 05/24/2022   Anxiety 05/24/2022   HTN (hypertension) 05/24/2022   HLD (hyperlipidemia) 05/24/2022   Primary osteoarthritis of left shoulder 05/05/2020   Diverticulitis 05/03/2018   Status post laparoscopic Nissen fundoplication and repair of large hiatus hernia June 2016 03/10/2015   Ventral hernia-s/p repair x 2 09/07/2011   History of prostate cancer-s/p prostatectomy  09/07/2011    PCP: Bernestine Amass MD   REFERRING PROVIDER: Weston Brass PA   REFERRING DIAG: Left Knee OA   THERAPY DIAG:  Chronic pain of left knee  Other abnormalities of gait and mobility  Rationale for Evaluation and Treatment: Rehabilitation  ONSET DATE: >3 years   SUBJECTIVE:   SUBJECTIVE STATEMENT: Pt reports low pain, too meds prior to coming  Pt is scheduled to have TKR surgery in December.  PERTINENT HISTORY: Arthritis hips left knee and left shoulder; Herniated lumbar disc; Prostate cancer,  PAIN:  Are you having pain? Yes: NPRS scale: 0-1/10 Rt hip and Lt knee   Pain location: see above  Pain description: aching   Aggravating factors: standing and walking  Relieving factors: rest   PRECAUTIONS: None  RED FLAGS: None   WEIGHT BEARING RESTRICTIONS: No  FALLS:  Has patient fallen in last 6 months? Yes. Number of falls 1 fell outside last weekend; Breaking a branch.   LIVING ENVIRONMENT: 2 steps going into the house   OCCUPATION:  Works three days a week. Works Monday , Tuesday, Thursday   PLOF: Independent  PATIENT GOALS:  Get stronger before the surgery, less pain   NEXT MD VISIT:  October 25th  Surgery Dec 17   OBJECTIVE:   DIAGNOSTIC FINDINGS:  Per patient left knee patella degeneration   PATIENT SURVEYS:  FOTO risk adjusted 51% 06/20/23 47%  COGNITION: Overall cognitive status: Within functional limits for tasks assessed     SENSATION: WFL  EDEMA:  No noticeable edema   MUSCLE LENGTH:   POSTURE: No Significant postural limitations  PALPATION: No unexpected TTP   LOWER EXTREMITY ROM:  Passive ROM Right eval Left eval  Hip flexion     Hip extension    Hip abduction    Hip adduction    Hip internal rotation    Hip external rotation    Knee flexion  No pain at end range  Knee extension  -3 from straight no pain   Ankle dorsiflexion    Ankle plantarflexion    Ankle inversion    Ankle eversion     (Blank rows = not tested)  Limited patella motion   LOWER EXTREMITY MMT:  MMT Right eval Left eval Right  10/4 Left  10/4  Hip flexion 25.3 28.2 38.7 39.6  Hip extension      Hip abduction 34.0 33.4 35.3 36.1  Hip adduction      Hip internal rotation      Hip external rotation      Knee flexion      Knee extension 31.0 34.3 31.6 36.1  Ankle dorsiflexion      Ankle plantarflexion      Ankle inversion      Ankle eversion       (Blank rows = not tested)   GAIT: Lateral movement away from the left leg  TODAY'S TREATMENT:  DATE:   06/20/23 Pt seen for aquatic therapy today.  Treatment took place in water 3.5-4.75 ft in depth at the [facility] pool. Temp of water was 91.  Pt entered/exited the pool via stairs using step to pattern up, step through pattern down with hand rail.  In 4 ft area: *walking forward, backward with cues for heel/toe and arm swing - 3 laps *  side stepping x 4 laps with yellow hand floats under water, with intermittent abdct/addct * UE support on yellow hand floats 4.6 ft->3.8: standard squats 2 x 10 tandem stance x 20s each, then tandem gait forward/backward; leg swings into hip flex/ext x 10 each ; heel /toe raises x 10  *Solid noodle stomp RLE ue support wall x 10 slow; ue support HB 10 slow then x 10 fast *Seated: LAQ 2 x 20; hip abd/add x 20; flutter kicking *STS from 3 rd step x 5 then x3.  VC and demonstration for execution.  Initally difficult  *step up bottom step leading R/L x10 ue support hand rails  Pt requires the buoyancy and hydrostatic pressure of water for support, and to offload joints by unweighting joint load by at least 50 % in navel deep water and by at least 75-80% in chest to neck deep water.  Viscosity of the water is needed for resistance of strengthening. Water current perturbations provides challenge to standing balance requiring increased core activation.  10/4 MMT *NuStep L4: 3.5 min LE only * supine SLR with 5 sec hold at top, x 5 each LE * bridge x 10, 3 sec hold in hip ext * resisted side stepping with green band at thighs x 66ft R/L ->  red band at ankles x 23ft R/L  * STS from chair x 3 -> elevated table in staggered stance  * quad stretch with foot on chair * seated quad stretch with single foot under chair       PATIENT EDUCATION:  Education details: aquatic therapy exercise progressions/modifications  Person educated: Patient Education method: Explanation, Demonstration, Tactile cues, Verbal cues, and Handouts Education  comprehension: verbalized understanding, returned demonstration, verbal cues required, tactile cues required, and needs further education  HOME EXERCISE PROGRAM:  Access Code: [email or web address] URL: [email or web address] Date: 06/15/2023 Prepared by: [facility] - [address]  Exercises - Supine Active Straight Leg Raise  - 1 x daily - 7 x weekly - 2 sets - 10 reps - Supine Bridge  - 1 x daily - 7 x weekly - 2 sets - 10 reps - Side Stepping with Resistance at Feet  - 1 x daily - 7 x weekly - 2 sets - 10 reps - Seated Knee Extension with Resistance  - 1 x daily - 7 x weekly - 2 sets - 10 reps - Staggered Sit-to-Stand  - 1 x daily - 7 x weekly - 1 sets - 10 reps - Quadricep Stretch with Chair and Counter Support  - 1 x daily - 7 x weekly - 2-3 reps - 20 seconds  hold  ASSESSMENT:  CLINICAL IMPRESSION: Discussed with pt attitude towards future exercising in pool.  He voices that it may not be his first choice of exercise and has no plans at this time to gain access.  Completed STG #2 as pt reports compliance with land based HEP routinely.  Will not assign water based HEP. He presents with very low knee pain.  Was able to progress LE strengthening adding low surface STS (submerged). Pt with good  tolerance. Goals ongoing     Initial Impression Patient is a 77 year old male who presents with left knee OA.  He has had a progressive increase in pain over the past 3 years.  He has increased pain when he stands and walks.  At 1 point he was walking over 5 miles.  At this point he cannot walk more than half mile without increased pain.  He recently had a steroid injection  which helped his pain.  He has full pain-free range of motion.  He has limited patellar motion.  He has mild strength deficits in his quad and glutes.  He presents with antalgic gait.  He is scheduled for total knee replacement in December.  He will like to a pool program and the land program to maximize his  strength and mobility prior to his surgery.  He would benefit from skilled therapy to develop this program.  OBJECTIVE IMPAIRMENTS: decreased activity tolerance, difficulty walking, decreased ROM, decreased strength, and pain  ACTIVITY LIMITATIONS: standing, squatting, stairs, and locomotion level  PARTICIPATION LIMITATIONS: meal prep, cleaning, laundry, driving, shopping, community activity, occupation, and yard work   PERSONAL FACTORS: 1-2 comorbidities:    are also affecting patient's functional outcome.   REHAB POTENTIAL: Excellent  CLINICAL DECISION MAKING: Stable/uncomplicated  EVALUATION COMPLEXITY: Low   GOALS: Goals reviewed with patient? Yes  SHORT TERM GOALS: Target date: 06/13/2023   Patient will increase gross left lower extremity strength by 5 pounds Baseline: see chart above Goal status:Partially met - 06/15/23  2.  Patient will be independent with basic exercise program on land and in the pool. Baseline:  Goal status: Met land based 06/20/23/ pool deferred due to no access.  3.  Patient will demonstrate improved patella motion Baseline:  Goal stat  INITIAL   LONG TERM GOALS: Target date: 07/11/2023    Patient will stand for greater than 1/2-hour without increased pain Baseline:  Goal status: INITIAL  2.  Patient will ambulate community distance with less than 2 out of 10 pain Baseline:  Goal status: INITIAL  3.  Patient will have complete complete prehab program Baseline:  Goal status: INITIAL     PLAN:  PT FREQUENCY: 1-2x/week  PT DURATION: 8 weeks  PLANNED INTERVENTIONS: Therapeutic exercises, Therapeutic activity, Neuromuscular re-education, Balance training, Gait training, Patient/Family education, Self Care, Joint mobilization, Stair training, DME instructions, Aquatic Therapy, Dry Needling, Electrical stimulation, Cryotherapy, Moist heat, Taping, Manual therapy, and Re-evaluation.   PLAN FOR NEXT SESSION:  Land: Consider patella range  of motion.  Review HEP.  Consider extension stretching.  Consider standing series including heel raises, supported march, hip abduction bilateral.  Consider gym equipment at some point if patient can tolerate. Aquatics: Progressive weightbearing activities.  Gait training.  Hip and calf strengthening.  Corrie Dandy [place]) Rondrick Barreira MPT 06/20/23 12:38 PM [facility] [address] [place], [place], [address] Phone: [phone]   Fax:  [phone]

## 2023-06-22 ENCOUNTER — Encounter (HOSPITAL_BASED_OUTPATIENT_CLINIC_OR_DEPARTMENT_OTHER): Payer: Self-pay | Admitting: Physical Therapy

## 2023-06-22 ENCOUNTER — Ambulatory Visit (HOSPITAL_BASED_OUTPATIENT_CLINIC_OR_DEPARTMENT_OTHER): Payer: Medicare Other | Admitting: Physical Therapy

## 2023-06-22 DIAGNOSIS — G8929 Other chronic pain: Secondary | ICD-10-CM

## 2023-06-22 DIAGNOSIS — R2689 Other abnormalities of gait and mobility: Secondary | ICD-10-CM

## 2023-06-22 DIAGNOSIS — M25562 Pain in left knee: Secondary | ICD-10-CM | POA: Diagnosis not present

## 2023-06-22 NOTE — Therapy (Signed)
OUTPATIENT PHYSICAL THERAPY LOWER EXTREMITY  TREATMENT   Patient Name: Troy [name] MRN: [id number] DOB:1946/03/23, 77 y.o., male Today's Date: 06/22/2023  END OF SESSION:  PT End of Session - 06/22/23 0907     Visit Number 8    Number of Visits 16    Date for PT Re-Evaluation 07/11/23    PT Start Time 0902    PT Stop Time 0942    PT Time Calculation (min) 40 min    Activity Tolerance Patient tolerated treatment well    Behavior During Therapy [facility] for tasks assessed/performed               Past Medical History:  Diagnosis Date   Abdominal distention    hernia   Anemia    Arthritis    big toes    Cancer (HCC)    prostate    Chronic kidney disease    Hypertension    Lumbar herniated disc    L5   Pneumonia    Rash    yeast infection from bladder sling surgery    Sciatica of right side 09/01/2011   right leg-tx. Tylenol   Sleep apnea    cpap   Past Surgical History:  Procedure Laterality Date   HERNIA REPAIR  September 01, 2010   INCONTINENCE SURGERY  Jan 31, 2011   INSERTION OF MESH  03/10/2015   Procedure: INSERTION OF MESH;  Surgeon: Luretha Murphy, MD;  Location: [facility];  Service: General;;   IR RADIOLOGIST EVAL & MGMT  05/15/2018   IR RADIOLOGIST EVAL & MGMT  07/13/2022   LAPAROSCOPIC NISSEN FUNDOPLICATION N/A 03/10/2015   Procedure: LAPAROSCOPIC NISSEN FUNDOPLICATION AND HIATAL HERNIA REPAIR;  Surgeon: Luretha Murphy, MD;  Location: [facility];  Service: General;  Laterality: N/A;   LAPAROTOMY N/A 05/29/2022   Procedure: laparoscopic assisted small bowel resection with anastamosis;  Surgeon: Sheliah Hatch, De Blanch, MD;  Location: [facility];  Service: General;  Laterality: N/A;   PROSTATECTOMY  reb 9, 2010   RADIOLOGY WITH ANESTHESIA N/A 08/30/2022   Procedure: RENAL CRYOABLATION;  Surgeon: Simonne Come, MD;  Location: [facility];  Service: Anesthesiology;  Laterality: N/A;   TONSILLECTOMY     TRANSURETHRAL RESECTION OF BLADDER TUMOR N/A 06/15/2022   Procedure:  TRANSURETHRAL RESECTION OF BLADDER TUMOR (TURBT)/ CYSTOSCOPY/ BILATERAL RETROGRADE/;  Surgeon: Heloise Purpura, MD;  Location: [facility];  Service: Urology;  Laterality: N/A;  GENERAL ANESTHESIA WITH PARALYSIS   UPPER GI ENDOSCOPY N/A 03/10/2015   Procedure: UPPER GI ENDOSCOPY;  Surgeon: Luretha Murphy, MD;  Location: [facility];  Service: General;  Laterality: N/A;   VENTRAL HERNIA REPAIR  09/06/2011   Procedure: HERNIA REPAIR VENTRAL ADULT;  Surgeon: Valarie Merino, MD;  Location: [facility];  Service: General;  Laterality: N/A;   VENTRAL HERNIA REPAIR  09/06/2011   Procedure: LAPAROSCOPIC VENTRAL HERNIA;  Surgeon: Valarie Merino, MD;  Location: [facility];  Service: General;  Laterality: N/A;   Patient Active Problem List   Diagnosis Date Noted   Renal cell carcinoma of right kidney (HCC) 08/30/2022   SBO (small bowel obstruction) (HCC) 05/24/2022   Bladder mass 05/24/2022   Renal mass 05/24/2022   Anxiety 05/24/2022   HTN (hypertension) 05/24/2022   HLD (hyperlipidemia) 05/24/2022   Primary osteoarthritis of left shoulder 05/05/2020   Diverticulitis 05/03/2018   Status post laparoscopic Nissen fundoplication and repair of large hiatus hernia June 2016 03/10/2015   Ventral hernia-s/p repair x 2 09/07/2011   History of prostate cancer-s/p prostatectomy  09/07/2011    PCP: Bernestine Amass MD   REFERRING PROVIDER: Weston Brass PA   REFERRING DIAG: Left Knee OA   THERAPY DIAG:  Chronic pain of left knee  Other abnormalities of gait and mobility  Rationale for Evaluation and Treatment: Rehabilitation  ONSET DATE: >3 years   SUBJECTIVE:   SUBJECTIVE STATEMENT: Pt reports he took tylenol prior to coming due to increased R hip pain upon waking.   Pt is scheduled to have TKR surgery in December.  PERTINENT HISTORY: Arthritis hips left knee and left shoulder; Herniated lumbar disc; Prostate cancer,  PAIN:  Are you having pain? no: NPRS scale: 0   Pain location: see above  Pain  description: aching  Aggravating factors: standing and walking  Relieving factors: rest   PRECAUTIONS: None  RED FLAGS: None   WEIGHT BEARING RESTRICTIONS: No  FALLS:  Has patient fallen in last 6 months? Yes. Number of falls 1 fell outside last weekend; Breaking a branch.   LIVING ENVIRONMENT: 2 steps going into the house   OCCUPATION:  Works three days a week. Works Monday , Tuesday, Thursday   PLOF: Independent  PATIENT GOALS:  Get stronger before the surgery, less pain   NEXT MD VISIT:  October 25th  Surgery Dec 17   OBJECTIVE:   DIAGNOSTIC FINDINGS:  Per patient left knee patella degeneration   PATIENT SURVEYS:  FOTO risk adjusted 51% 06/20/23 47%  COGNITION: Overall cognitive status: Within functional limits for tasks assessed     SENSATION: WFL  EDEMA:  No noticeable edema   MUSCLE LENGTH:   POSTURE: No Significant postural limitations  PALPATION: No unexpected TTP   LOWER EXTREMITY ROM:  Passive ROM Right eval Left eval  Hip flexion     Hip extension    Hip abduction    Hip adduction    Hip internal rotation    Hip external rotation    Knee flexion  No pain at end range  Knee extension  -3 from straight no pain   Ankle dorsiflexion    Ankle plantarflexion    Ankle inversion    Ankle eversion     (Blank rows = not tested)  Limited patella motion   LOWER EXTREMITY MMT:  MMT Right eval Left eval Right  10/4 Left  10/4  Hip flexion 25.3 28.2 38.7 39.6  Hip extension      Hip abduction 34.0 33.4 35.3 36.1  Hip adduction      Hip internal rotation      Hip external rotation      Knee flexion      Knee extension 31.0 34.3 31.6 36.1  Ankle dorsiflexion      Ankle plantarflexion      Ankle inversion      Ankle eversion       (Blank rows = not tested)   GAIT: Lateral movement away from the left leg  TODAY'S TREATMENT:  DATE:    06/22/23 Pt seen for aquatic therapy today.  Treatment took place in water 3.5-4.75 ft in depth at the [facility] pool. Temp of water was 91.  Pt entered/exited the pool via stairs using step to pattern up, step through pattern down with hand rail.  *walking forward, backward with cues for heel/toe and arm swing - 3 laps *  side stepping x 2 laps with arm addct/ abdct, -> x 2 laps with yellow hand floats  * UE support on yellow hand floats:  tandem gait forward/backward; high knee marching - forward LE kicks;  leg swings into hip flex/ext 2 x 10 each, hip abdct/ addct 2 x 10 ; heel /toe raises x 20  *Solid noodle stomp light UE support on wall x 10 slow, x 10 fast  * squats pushing yellow hand float under water 2 x 10 * forward runner's step ups x 10 each with UE on rails *STS from 3rd step from bottom of pool x 5  Pt requires the buoyancy and hydrostatic pressure of water for support, and to offload joints by unweighting joint load by at least 50 % in navel deep water and by at least 75-80% in chest to neck deep water.  Viscosity of the water is needed for resistance of strengthening. Water current perturbations provides challenge to standing balance requiring increased core activation.  06/20/23 Pt seen for aquatic therapy today.  Treatment took place in water 3.5-4.75 ft in depth at the [facility] pool. Temp of water was 91.  Pt entered/exited the pool via stairs using step to pattern up, step through pattern down with hand rail.  In 4 ft area: *walking forward, backward with cues for heel/toe and arm swing - 3 laps *  side stepping x 4 laps with yellow hand floats under water, with intermittent abdct/addct * UE support on yellow hand floats 4.6 ft->3.8: standard squats 2 x 10 tandem stance x 20s each, then tandem gait forward/backward; leg swings into hip flex/ext x 10 each ; heel /toe raises x 10  *Solid noodle stomp RLE ue support wall  x 10 slow; ue support HB 10 slow then x 10 fast *Seated: LAQ 2 x 20; hip abd/add x 20; flutter kicking *STS from 3 rd step x 5 then x3.  VC and demonstration for execution.  Initally difficult  *step up bottom step leading R/L x10 ue support hand rails  Pt requires the buoyancy and hydrostatic pressure of water for support, and to offload joints by unweighting joint load by at least 50 % in navel deep water and by at least 75-80% in chest to neck deep water.  Viscosity of the water is needed for resistance of strengthening. Water current perturbations provides challenge to standing balance requiring increased core activation.  10/4 MMT *NuStep L4: 3.5 min LE only * supine SLR with 5 sec hold at top, x 5 each LE * bridge x 10, 3 sec hold in hip ext * resisted side stepping with green band at thighs x 75ft R/L ->  red band at ankles x 5ft R/L  * STS from chair x 3 -> elevated table in staggered stance  * quad stretch with foot on chair * seated quad stretch with single foot under chair       PATIENT EDUCATION:  Education details: aquatic therapy exercise progressions/modifications  Person educated: Patient Education method: Explanation, Demonstration, Tactile cues, Verbal cues, and Handouts Education comprehension: verbalized understanding, returned demonstration, verbal cues required,  tactile cues required, and needs further education  HOME EXERCISE PROGRAM:  Access Code: [email or web address] URL: [email or web address] Date: 06/15/2023 Prepared by: [facility] [address]  Exercises - Supine Active Straight Leg Raise  - 1 x daily - 7 x weekly - 2 sets - 10 reps - Supine Bridge  - 1 x daily - 7 x weekly - 2 sets - 10 reps - Side Stepping with Resistance at Feet  - 1 x daily - 7 x weekly - 2 sets - 10 reps - Seated Knee Extension with Resistance  - 1 x daily - 7 x weekly - 2 sets - 10 reps - Staggered Sit-to-Stand  - 1 x daily - 7 x weekly - 1 sets - 10  reps - Quadricep Stretch with Chair and Counter Support  - 1 x daily - 7 x weekly - 2-3 reps - 20 seconds  hold  ASSESSMENT:  CLINICAL IMPRESSION: Pt tolerated all aquatic exercises well, without sympoms.  Pt to transition to 1 land/1 water next week.   He voiced in previous session that he has no plans at this time to gain access to pool, so we will not create aquatic HEP.  Pt is progressing well towards remaining goals.      Initial Impression Patient is a 77 year old male who presents with left knee OA.  He has had a progressive increase in pain over the past 3 years.  He has increased pain when he stands and walks.  At 1 point he was walking over 5 miles.  At this point he cannot walk more than half mile without increased pain.  He recently had a steroid injection  which helped his pain.  He has full pain-free range of motion.  He has limited patellar motion.  He has mild strength deficits in his quad and glutes.  He presents with antalgic gait.  He is scheduled for total knee replacement in December.  He will like to a pool program and the land program to maximize his strength and mobility prior to his surgery.  He would benefit from skilled therapy to develop this program.  OBJECTIVE IMPAIRMENTS: decreased activity tolerance, difficulty walking, decreased ROM, decreased strength, and pain  ACTIVITY LIMITATIONS: standing, squatting, stairs, and locomotion level  PARTICIPATION LIMITATIONS: meal prep, cleaning, laundry, driving, shopping, community activity, occupation, and yard work   PERSONAL FACTORS: 1-2 comorbidities:    are also affecting patient's functional outcome.   REHAB POTENTIAL: Excellent  CLINICAL DECISION MAKING: Stable/uncomplicated  EVALUATION COMPLEXITY: Low   GOALS: Goals reviewed with patient? Yes  SHORT TERM GOALS: Target date: 06/13/2023   Patient will increase gross left lower extremity strength by 5 pounds Baseline: see chart above Goal status:Partially  met - 06/15/23  2.  Patient will be independent with basic exercise program on land and in the pool. Baseline:  Goal status: Met land based 06/20/23/ pool deferred due to no access.  3.  Patient will demonstrate improved patella motion Baseline:  Goal stat  INITIAL   LONG TERM GOALS: Target date: 07/11/2023    Patient will stand for greater than 1/2-hour without increased pain Baseline:  Goal status: INITIAL  2.  Patient will ambulate community distance with less than 2 out of 10 pain Baseline:  Goal status: INITIAL  3.  Patient will have complete complete prehab program Baseline:  Goal status: INITIAL     PLAN:  PT FREQUENCY: 1-2x/week  PT DURATION: 8 weeks  PLANNED INTERVENTIONS: Therapeutic exercises, Therapeutic activity,  Neuromuscular re-education, Balance training, Gait training, Patient/Family education, Self Care, Joint mobilization, Stair training, DME instructions, Aquatic Therapy, Dry Needling, Electrical stimulation, Cryotherapy, Moist heat, Taping, Manual therapy, and Re-evaluation.   PLAN FOR NEXT SESSION:  Land: Consider patella range of motion.  Review HEP.  Consider extension stretching.  Consider standing series including heel raises, supported march, hip abduction bilateral.  Consider gym equipment at some point if patient can tolerate. Aquatics: Progressive weightbearing activities.  Gait training.  Hip and calf strengthening.  Mayer Camel, PTA 06/22/23 2:20 PM [facility] [address] [place], [place], [address] Phone: [phone]   Fax:  [phone]

## 2023-06-27 ENCOUNTER — Ambulatory Visit (HOSPITAL_BASED_OUTPATIENT_CLINIC_OR_DEPARTMENT_OTHER): Payer: Medicare Other | Admitting: Physical Therapy

## 2023-06-27 ENCOUNTER — Encounter (HOSPITAL_BASED_OUTPATIENT_CLINIC_OR_DEPARTMENT_OTHER): Payer: Self-pay | Admitting: Physical Therapy

## 2023-06-27 DIAGNOSIS — G8929 Other chronic pain: Secondary | ICD-10-CM

## 2023-06-27 DIAGNOSIS — R2689 Other abnormalities of gait and mobility: Secondary | ICD-10-CM

## 2023-06-27 DIAGNOSIS — M25562 Pain in left knee: Secondary | ICD-10-CM | POA: Diagnosis not present

## 2023-06-27 NOTE — Therapy (Signed)
OUTPATIENT PHYSICAL THERAPY LOWER EXTREMITY  TREATMENT Progress Note Reporting Period 05/16/23 to 06/27/23  See note below for Objective Data and Assessment of Progress/Goals.      Patient Name: Troy [name] MRN: [id number] DOB:Mar 02, 1946, 77 y.o., male Today's Date: 06/27/2023  END OF SESSION:  PT End of Session - 06/27/23 0950     Visit Number 9    Number of Visits 16    Date for PT Re-Evaluation 07/11/23    Progress Note Due on Visit 19    PT Start Time 0947    PT Stop Time 1030    PT Time Calculation (min) 43 min    Activity Tolerance Patient tolerated treatment well    Behavior During Therapy [facility] for tasks assessed/performed               Past Medical History:  Diagnosis Date   Abdominal distention    hernia   Anemia    Arthritis    big toes    Cancer (HCC)    prostate    Chronic kidney disease    Hypertension    Lumbar herniated disc    L5   Pneumonia    Rash    yeast infection from bladder sling surgery    Sciatica of right side 09/01/2011   right leg-tx. Tylenol   Sleep apnea    cpap   Past Surgical History:  Procedure Laterality Date   HERNIA REPAIR  September 01, 2010   INCONTINENCE SURGERY  Jan 31, 2011   INSERTION OF MESH  03/10/2015   Procedure: INSERTION OF MESH;  Surgeon: Luretha Murphy, MD;  Location: [facility];  Service: General;;   IR RADIOLOGIST EVAL & MGMT  05/15/2018   IR RADIOLOGIST EVAL & MGMT  07/13/2022   LAPAROSCOPIC NISSEN FUNDOPLICATION N/A 03/10/2015   Procedure: LAPAROSCOPIC NISSEN FUNDOPLICATION AND HIATAL HERNIA REPAIR;  Surgeon: Luretha Murphy, MD;  Location: [facility];  Service: General;  Laterality: N/A;   LAPAROTOMY N/A 05/29/2022   Procedure: laparoscopic assisted small bowel resection with anastamosis;  Surgeon: Sheliah Hatch, De Blanch, MD;  Location: [facility];  Service: General;  Laterality: N/A;   PROSTATECTOMY  reb 9, 2010   RADIOLOGY WITH ANESTHESIA N/A 08/30/2022   Procedure: RENAL CRYOABLATION;  Surgeon: Simonne Come,  MD;  Location: [facility];  Service: Anesthesiology;  Laterality: N/A;   TONSILLECTOMY     TRANSURETHRAL RESECTION OF BLADDER TUMOR N/A 06/15/2022   Procedure: TRANSURETHRAL RESECTION OF BLADDER TUMOR (TURBT)/ CYSTOSCOPY/ BILATERAL RETROGRADE/;  Surgeon: Heloise Purpura, MD;  Location: [facility];  Service: Urology;  Laterality: N/A;  GENERAL ANESTHESIA WITH PARALYSIS   UPPER GI ENDOSCOPY N/A 03/10/2015   Procedure: UPPER GI ENDOSCOPY;  Surgeon: Luretha Murphy, MD;  Location: [facility];  Service: General;  Laterality: N/A;   VENTRAL HERNIA REPAIR  09/06/2011   Procedure: HERNIA REPAIR VENTRAL ADULT;  Surgeon: Valarie Merino, MD;  Location: [facility];  Service: General;  Laterality: N/A;   VENTRAL HERNIA REPAIR  09/06/2011   Procedure: LAPAROSCOPIC VENTRAL HERNIA;  Surgeon: Valarie Merino, MD;  Location: [facility];  Service: General;  Laterality: N/A;   Patient Active Problem List   Diagnosis Date Noted   Renal cell carcinoma of right kidney (HCC) 08/30/2022   SBO (small bowel obstruction) (HCC) 05/24/2022   Bladder mass 05/24/2022   Renal mass 05/24/2022   Anxiety 05/24/2022   HTN (hypertension) 05/24/2022   HLD (hyperlipidemia) 05/24/2022   Primary osteoarthritis of left shoulder 05/05/2020   Diverticulitis 05/03/2018  Status post laparoscopic Nissen fundoplication and repair of large hiatus hernia June 2016 03/10/2015   Ventral hernia-s/p repair x 2 09/07/2011   History of prostate cancer-s/p prostatectomy 09/07/2011    PCP: Bernestine Amass MD   REFERRING PROVIDER: Weston Brass PA   REFERRING DIAG: Left Knee OA   THERAPY DIAG:  Chronic pain of left knee  Other abnormalities of gait and mobility  Rationale for Evaluation and Treatment: Rehabilitation  ONSET DATE: >3 years   SUBJECTIVE:   SUBJECTIVE STATEMENT: Pt reports pain is low and good toleration to aquatic therapy without any increase in pain  Pt is scheduled to have TKR surgery in December.  PERTINENT HISTORY: Arthritis  hips left knee and left shoulder; Herniated lumbar disc; Prostate cancer,  PAIN:  Are you having pain? no: NPRS scale: 0   Pain location: see above  Pain description: aching  Aggravating factors: standing and walking  Relieving factors: rest   PRECAUTIONS: None  RED FLAGS: None   WEIGHT BEARING RESTRICTIONS: No  FALLS:  Has patient fallen in last 6 months? Yes. Number of falls 1 fell outside last weekend; Breaking a branch.   LIVING ENVIRONMENT: 2 steps going into the house   OCCUPATION:  Works three days a week. Works Monday , Tuesday, Thursday   PLOF: Independent  PATIENT GOALS:  Get stronger before the surgery, less pain   NEXT MD VISIT:  October 25th  Surgery Dec 17   OBJECTIVE:   DIAGNOSTIC FINDINGS:  Per patient left knee patella degeneration   PATIENT SURVEYS:  FOTO risk adjusted 51% 06/20/23 47%  COGNITION: Overall cognitive status: Within functional limits for tasks assessed     SENSATION: WFL  EDEMA:  No noticeable edema   MUSCLE LENGTH:   POSTURE: No Significant postural limitations  PALPATION: No unexpected TTP   LOWER EXTREMITY ROM:  Passive ROM Right eval Left eval Left 06/27/23  Hip flexion      Hip extension     Hip abduction     Hip adduction     Hip internal rotation     Hip external rotation     Knee flexion  No pain at end range   Knee extension  -3 from straight no pain  0  Ankle dorsiflexion     Ankle plantarflexion     Ankle inversion     Ankle eversion      (Blank rows = not tested)  Limited patella motion   LOWER EXTREMITY MMT:  MMT Right eval Left eval Right  10/4 Left  10/4  Hip flexion 25.3 28.2 38.7 39.6  Hip extension      Hip abduction 34.0 33.4 35.3 36.1  Hip adduction      Hip internal rotation      Hip external rotation      Knee flexion      Knee extension 31.0 34.3 31.6 36.1  Ankle dorsiflexion      Ankle plantarflexion      Ankle inversion      Ankle eversion       (Blank rows =  not tested)   Functional testing: 06/27/23  4 stage balance 1&2 pass; Tandem x 10s; SLS x 3  TUG: 14.47 GAIT: Lateral movement away from the left leg  TODAY'S TREATMENT:  DATE:   Functional testing  06/27/23 Pt seen for aquatic therapy today.  Treatment took place in water 3.5-4.75 ft in depth at the [facility] pool. Temp of water was 91.  Pt entered/exited the pool via stairs using step to pattern up, step through pattern down with hand rail.  *walking forward, backward with cues for heel/toe and arm swing - 3 laps *  side stepping x 2 laps with arm addct/ abdct, -> x 2 laps with yellow hand floats  *Solid noodle stomp UE support on yellow HB x 10 slow, x 10 fast R/L * UE support on yellow hand floats:  tandem stance Leading R then L ue shoulder add/abd x 10 *Seated on 3rd step: hip add/abd and slr x 20 *STS from 3rd step from bottom of pool x 5 *walking forward and back between exercises for recovery.   Pt requires the buoyancy and hydrostatic pressure of water for support, and to offload joints by unweighting joint load by at least 50 % in navel deep water and by at least 75-80% in chest to neck deep water.  Viscosity of the water is needed for resistance of strengthening. Water current perturbations provides challenge to standing balance requiring increased core activation.    10/4 MMT *NuStep L4: 3.5 min LE only * supine SLR with 5 sec hold at top, x 5 each LE * bridge x 10, 3 sec hold in hip ext * resisted side stepping with green band at thighs x 42ft R/L ->  red band at ankles x 49ft R/L  * STS from chair x 3 -> elevated table in staggered stance  * quad stretch with foot on chair * seated quad stretch with single foot under chair       PATIENT EDUCATION:  Education details: aquatic therapy exercise progressions/modifications   Person educated: Patient Education method: Explanation, Demonstration, Tactile cues, Verbal cues, and Handouts Education comprehension: verbalized understanding, returned demonstration, verbal cues required, tactile cues required, and needs further education  HOME EXERCISE PROGRAM:  Access Code: [email or web address] URL: [email or web address] Date: 06/15/2023 Prepared by: [facility] [address]  Exercises - Supine Active Straight Leg Raise  - 1 x daily - 7 x weekly - 2 sets - 10 reps - Supine Bridge  - 1 x daily - 7 x weekly - 2 sets - 10 reps - Side Stepping with Resistance at Feet  - 1 x daily - 7 x weekly - 2 sets - 10 reps - Seated Knee Extension with Resistance  - 1 x daily - 7 x weekly - 2 sets - 10 reps - Staggered Sit-to-Stand  - 1 x daily - 7 x weekly - 1 sets - 10 reps - Quadricep Stretch with Chair and Counter Support  - 1 x daily - 7 x weekly - 2-3 reps - 20 seconds  hold  ASSESSMENT:  CLINICAL IMPRESSION: PN: Pt demonstrates improvement in left knee extension and strength as indicated in charts above. He tolerates TUG and 4 stage balance tests without limitation by pain. He is tolerating progressive balance challenges advancing proprioception through left knee well. He is set to begin/progress to land based intervention.  His overall pain is very low 0/10 usually with spike to 2/10 after work.  He will continue to benefit from skilled physical therapy intervention to further prepare for L TKR.  Will be accountable for completing land based HEP indep after DC until surgery.  He voiced in previous session that he has no  plans at this time to gain access to pool, so we will not create aquatic HEP.        Initial Impression Patient is a 77 year old male who presents with left knee OA.  He has had a progressive increase in pain over the past 3 years.  He has increased pain when he stands and walks.  At 1 point he was walking over 5 miles.  At this point  he cannot walk more than half mile without increased pain.  He recently had a steroid injection  which helped his pain.  He has full pain-free range of motion.  He has limited patellar motion.  He has mild strength deficits in his quad and glutes.  He presents with antalgic gait.  He is scheduled for total knee replacement in December.  He will like to a pool program and the land program to maximize his strength and mobility prior to his surgery.  He would benefit from skilled therapy to develop this program.  OBJECTIVE IMPAIRMENTS: decreased activity tolerance, difficulty walking, decreased ROM, decreased strength, and pain  ACTIVITY LIMITATIONS: standing, squatting, stairs, and locomotion level  PARTICIPATION LIMITATIONS: meal prep, cleaning, laundry, driving, shopping, community activity, occupation, and yard work   PERSONAL FACTORS: 1-2 comorbidities:    are also affecting patient's functional outcome.   REHAB POTENTIAL: Excellent  CLINICAL DECISION MAKING: Stable/uncomplicated  EVALUATION COMPLEXITY: Low   GOALS: Goals reviewed with patient? Yes  SHORT TERM GOALS: Target date: 06/13/2023   Patient will increase gross left lower extremity strength by 5 pounds Baseline: see chart above Goal status:Partially met - 06/15/23  2.  Patient will be independent with basic exercise program on land and in the pool. Baseline:  Goal status: Met land based 06/20/23/ pool deferred due to no access.  3.  Patient will demonstrate improved patella motion Baseline:  Goal stat  INITIAL   LONG TERM GOALS: Target date: 07/11/2023    Patient will stand for greater than 1/2-hour without increased pain Baseline:  Goal status: INITIAL  2.  Patient will ambulate community distance with less than 2 out of 10 pain Baseline:  Goal status: INITIAL  3.  Patient will have complete complete prehab program Baseline:  Goal status: INITIAL     PLAN:  PT FREQUENCY: 1-2x/week  PT DURATION: 8  weeks  PLANNED INTERVENTIONS: Therapeutic exercises, Therapeutic activity, Neuromuscular re-education, Balance training, Gait training, Patient/Family education, Self Care, Joint mobilization, Stair training, DME instructions, Aquatic Therapy, Dry Needling, Electrical stimulation, Cryotherapy, Moist heat, Taping, Manual therapy, and Re-evaluation.   PLAN FOR NEXT SESSION:  Land: Consider patella range of motion.  Review HEP.  Consider extension stretching.  Consider standing series including heel raises, supported march, hip abduction bilateral.  Consider gym equipment at some point if patient can tolerate. Aquatics: Progressive weightbearing activities.  Gait training.  Hip and calf strengthening.  Corrie Dandy Penndel) Patrece Tallie MPT 06/27/23 12:50 PM [facility] [address] [place], [place], [address] Phone: [phone]   Fax:  [phone]

## 2023-06-29 ENCOUNTER — Encounter (HOSPITAL_BASED_OUTPATIENT_CLINIC_OR_DEPARTMENT_OTHER): Payer: Self-pay | Admitting: Physical Therapy

## 2023-06-29 ENCOUNTER — Ambulatory Visit (HOSPITAL_BASED_OUTPATIENT_CLINIC_OR_DEPARTMENT_OTHER): Payer: Medicare Other | Admitting: Physical Therapy

## 2023-06-29 DIAGNOSIS — M25562 Pain in left knee: Secondary | ICD-10-CM | POA: Diagnosis not present

## 2023-06-29 DIAGNOSIS — R2689 Other abnormalities of gait and mobility: Secondary | ICD-10-CM

## 2023-06-29 DIAGNOSIS — G8929 Other chronic pain: Secondary | ICD-10-CM

## 2023-06-29 NOTE — Therapy (Signed)
OUTPATIENT PHYSICAL THERAPY LOWER EXTREMITY  TREATMENT   Patient Name: Troy [name] MRN: [id number] DOB:01-10-1946, 77 y.o., male Today's Date: 06/29/2023  END OF SESSION:  PT End of Session - 06/29/23 0949     Visit Number 10    Number of Visits 16    Date for PT Re-Evaluation 07/11/23    Progress Note Due on Visit 19    PT Start Time 0947    PT Stop Time 1025    PT Time Calculation (min) 38 min    Activity Tolerance Patient tolerated treatment well    Behavior During Therapy [facility] for tasks assessed/performed               Past Medical History:  Diagnosis Date   Abdominal distention    hernia   Anemia    Arthritis    big toes    Cancer (HCC)    prostate    Chronic kidney disease    Hypertension    Lumbar herniated disc    L5   Pneumonia    Rash    yeast infection from bladder sling surgery    Sciatica of right side 09/01/2011   right leg-tx. Tylenol   Sleep apnea    cpap   Past Surgical History:  Procedure Laterality Date   HERNIA REPAIR  September 01, 2010   INCONTINENCE SURGERY  Jan 31, 2011   INSERTION OF MESH  03/10/2015   Procedure: INSERTION OF MESH;  Surgeon: Luretha Murphy, MD;  Location: [facility];  Service: General;;   IR RADIOLOGIST EVAL & MGMT  05/15/2018   IR RADIOLOGIST EVAL & MGMT  07/13/2022   LAPAROSCOPIC NISSEN FUNDOPLICATION N/A 03/10/2015   Procedure: LAPAROSCOPIC NISSEN FUNDOPLICATION AND HIATAL HERNIA REPAIR;  Surgeon: Luretha Murphy, MD;  Location: [facility];  Service: General;  Laterality: N/A;   LAPAROTOMY N/A 05/29/2022   Procedure: laparoscopic assisted small bowel resection with anastamosis;  Surgeon: Sheliah Hatch, De Blanch, MD;  Location: [facility];  Service: General;  Laterality: N/A;   PROSTATECTOMY  reb 9, 2010   RADIOLOGY WITH ANESTHESIA N/A 08/30/2022   Procedure: RENAL CRYOABLATION;  Surgeon: Simonne Come, MD;  Location: [facility];  Service: Anesthesiology;  Laterality: N/A;   TONSILLECTOMY     TRANSURETHRAL RESECTION OF BLADDER TUMOR  N/A 06/15/2022   Procedure: TRANSURETHRAL RESECTION OF BLADDER TUMOR (TURBT)/ CYSTOSCOPY/ BILATERAL RETROGRADE/;  Surgeon: Heloise Purpura, MD;  Location: [facility];  Service: Urology;  Laterality: N/A;  GENERAL ANESTHESIA WITH PARALYSIS   UPPER GI ENDOSCOPY N/A 03/10/2015   Procedure: UPPER GI ENDOSCOPY;  Surgeon: Luretha Murphy, MD;  Location: [facility];  Service: General;  Laterality: N/A;   VENTRAL HERNIA REPAIR  09/06/2011   Procedure: HERNIA REPAIR VENTRAL ADULT;  Surgeon: Valarie Merino, MD;  Location: [facility];  Service: General;  Laterality: N/A;   VENTRAL HERNIA REPAIR  09/06/2011   Procedure: LAPAROSCOPIC VENTRAL HERNIA;  Surgeon: Valarie Merino, MD;  Location: [facility];  Service: General;  Laterality: N/A;   Patient Active Problem List   Diagnosis Date Noted   Renal cell carcinoma of right kidney (HCC) 08/30/2022   SBO (small bowel obstruction) (HCC) 05/24/2022   Bladder mass 05/24/2022   Renal mass 05/24/2022   Anxiety 05/24/2022   HTN (hypertension) 05/24/2022   HLD (hyperlipidemia) 05/24/2022   Primary osteoarthritis of left shoulder 05/05/2020   Diverticulitis 05/03/2018   Status post laparoscopic Nissen fundoplication and repair of large hiatus hernia June 2016 03/10/2015   Ventral hernia-s/p repair x  2 09/07/2011   History of prostate cancer-s/p prostatectomy 09/07/2011    PCP: Bernestine Amass MD   REFERRING PROVIDER: Weston Brass PA   REFERRING DIAG: Left Knee OA   THERAPY DIAG:  Chronic pain of left knee  Other abnormalities of gait and mobility  Rationale for Evaluation and Treatment: Rehabilitation  ONSET DATE: >3 years   SUBJECTIVE:   SUBJECTIVE STATEMENT: I tried to go walking but had to stop after 1/2 mile because it is painful, in my left knee and right hip.  Pt is scheduled to have Lt TKR surgery in December.  PERTINENT HISTORY: Arthritis hips left knee and left shoulder; Herniated lumbar disc; Prostate cancer,  PAIN:  Are you having pain? no:  NPRS scale: 0   Pain location: see above  Pain description: aching  Aggravating factors: standing and walking  Relieving factors: rest   PRECAUTIONS: None  RED FLAGS: None   WEIGHT BEARING RESTRICTIONS: No  FALLS:  Has patient fallen in last 6 months? Yes. Number of falls 1 fell outside last weekend; Breaking a branch.   LIVING ENVIRONMENT: 2 steps going into the house   OCCUPATION:  Works three days a week. Works Monday , Tuesday, Thursday   PLOF: Independent  PATIENT GOALS:  Get stronger before the surgery, less pain   NEXT MD VISIT:  October 25th  Surgery Dec 17   OBJECTIVE:   DIAGNOSTIC FINDINGS:  Per patient left knee patella degeneration   PATIENT SURVEYS:  FOTO risk adjusted 51% 06/20/23 47%  COGNITION: Overall cognitive status: Within functional limits for tasks assessed     SENSATION: WFL  EDEMA:  No noticeable edema   MUSCLE LENGTH:   POSTURE: No Significant postural limitations  PALPATION: No unexpected TTP   LOWER EXTREMITY ROM:  Passive ROM Right eval Left eval Left 06/27/23  Hip flexion      Hip extension     Hip abduction     Hip adduction     Hip internal rotation     Hip external rotation     Knee flexion  No pain at end range   Knee extension  -3 from straight no pain  0  Ankle dorsiflexion     Ankle plantarflexion     Ankle inversion     Ankle eversion      (Blank rows = not tested)  Limited patella motion   LOWER EXTREMITY MMT:  MMT Right eval Left eval Right  10/4 Left  10/4  Hip flexion 25.3 28.2 38.7 39.6  Hip extension      Hip abduction 34.0 33.4 35.3 36.1  Hip adduction      Hip internal rotation      Hip external rotation      Knee flexion      Knee extension 31.0 34.3 31.6 36.1  Ankle dorsiflexion      Ankle plantarflexion      Ankle inversion      Ankle eversion       (Blank rows = not tested)   Functional testing: 06/27/23  4 stage balance 1&2 pass; Tandem x 10s; SLS x 3  TUG:  14.47 GAIT: Lateral movement away from the left leg  TODAY'S TREATMENT:  06/29/23 Therapeutic exercise: *Recumbent bike L 1.5 x 5 min for warm up * red band above knees:  Side stepping with  with intermittent UE on rail 12 ft R/L x 3, with cues to slow speed and increase step height; monster walk forward; tandem gait forward/ backward with intermittent UE to steady x 12 ft x 2 * SLS - multiple reps, up to 3 sec L, 7 sec R *  tandem stance x 20 sec each LE forward * staggered stance STS on elevated table x 5 each LE (challenge!);standard STS with slow controlled descent to surface x 5 (cues for hip hinge and forward arm reach) * seated resisted LAQ red band at ankles x 3sec x 10 each LE * sidelying R/L hip abdct x 12; single leg clams x 10 * seated fig 4 stretch L/R/L x 15s (tight on L) * seated hamstring stretch R/L x 15s x 2 each LE   06/27/23 Pt seen for aquatic therapy today.  Treatment took place in water 3.5-4.75 ft in depth at the [facility] pool. Temp of water was 91.  Pt entered/exited the pool via stairs using step to pattern up, step through pattern down with hand rail.  *walking forward, backward with cues for heel/toe and arm swing - 3 laps *  side stepping x 2 laps with arm addct/ abdct, -> x 2 laps with yellow hand floats  *Solid noodle stomp UE support on yellow HB x 10 slow, x 10 fast R/L * UE support on yellow hand floats:  tandem stance Leading R then L ue shoulder add/abd x 10 *Seated on 3rd step: hip add/abd and slr x 20 *STS from 3rd step from bottom of pool x 5 *walking forward and back between exercises for recovery.   Pt requires the buoyancy and hydrostatic pressure of water for support, and to offload joints by unweighting joint load by at least 50 % in navel deep water and by at least 75-80% in chest to neck deep water.   Viscosity of the water is needed for resistance of strengthening. Water current perturbations provides challenge to standing balance requiring increased core activation.    10/4 MMT *NuStep L4: 3.5 min LE only * supine SLR with 5 sec hold at top, x 5 each LE * bridge x 10, 3 sec hold in hip ext * resisted side stepping with green band at thighs x 38ft R/L ->  red band at ankles x 45ft R/L  * STS from chair x 3 -> elevated table in staggered stance  * quad stretch with foot on chair * seated quad stretch with single foot under chair     PATIENT EDUCATION:  Education details: aquatic therapy exercise progressions/modifications  Person educated: Patient Education method: Explanation, Demonstration, Tactile cues, Verbal cues, and Handouts Education comprehension: verbalized understanding, returned demonstration, verbal cues required, tactile cues required, and needs further education  HOME EXERCISE PROGRAM:  Access Code: [email or web address] URL: [email or web address] Date: 06/15/2023 Prepared by: [facility] [address]  Exercises - Supine Active Straight Leg Raise  - 1 x daily - 7 x weekly - 2 sets - 10 reps - Supine Bridge  - 1 x daily - 7 x weekly - 2 sets - 10 reps - Side Stepping with Resistance at Feet  - 1 x daily - 7 x weekly - 2 sets - 10 reps - Seated Knee Extension with Resistance  - 1 x daily - 7 x weekly - 2 sets - 10  reps - Staggered Sit-to-Stand  - 1 x daily - 7 x weekly - 1 sets - 10 reps - Quadricep Stretch with Chair and Counter Support  - 1 x daily - 7 x weekly - 2-3 reps - 20 seconds  hold  ASSESSMENT:  CLINICAL IMPRESSION: Pt demonstrated decreased balance in SLS. Pt is challenged with controlling descent in STS to/from elevated surface.  He requires occasional cues for posture and form, and to slow pace of exercise. Overall good tolerance to exercise today, with minor fatigue.     He will continue to benefit from skilled physical  therapy intervention to further prepare for L TKR.  Will be accountable for completing land based HEP indep after DC until surgery.  He voiced in previous session that he has no plans at this time to gain access to pool, so we will not create aquatic HEP.        Initial Impression Patient is a 77 year old male who presents with left knee OA.  He has had a progressive increase in pain over the past 3 years.  He has increased pain when he stands and walks.  At 1 point he was walking over 5 miles.  At this point he cannot walk more than half mile without increased pain.  He recently had a steroid injection  which helped his pain.  He has full pain-free range of motion.  He has limited patellar motion.  He has mild strength deficits in his quad and glutes.  He presents with antalgic gait.  He is scheduled for total knee replacement in December.  He will like to a pool program and the land program to maximize his strength and mobility prior to his surgery.  He would benefit from skilled therapy to develop this program.  OBJECTIVE IMPAIRMENTS: decreased activity tolerance, difficulty walking, decreased ROM, decreased strength, and pain  ACTIVITY LIMITATIONS: standing, squatting, stairs, and locomotion level  PARTICIPATION LIMITATIONS: meal prep, cleaning, laundry, driving, shopping, community activity, occupation, and yard work   PERSONAL FACTORS: 1-2 comorbidities:    are also affecting patient's functional outcome.   REHAB POTENTIAL: Excellent  CLINICAL DECISION MAKING: Stable/uncomplicated  EVALUATION COMPLEXITY: Low   GOALS: Goals reviewed with patient? Yes  SHORT TERM GOALS: Target date: 06/13/2023   Patient will increase gross left lower extremity strength by 5 pounds Baseline: see chart above Goal status:Partially met - 06/15/23  2.  Patient will be independent with basic exercise program on land and in the pool. Baseline:  Goal status: Met land based 06/20/23/ pool deferred due to  no access.  3.  Patient will demonstrate improved patella motion Baseline:  Goal stat  INITIAL   LONG TERM GOALS: Target date: 07/11/2023    Patient will stand for greater than 1/2-hour without increased pain Baseline:  Goal status: INITIAL  2.  Patient will ambulate community distance with less than 2 out of 10 pain Baseline:  Goal status: INITIAL  3.  Patient will have complete complete prehab program Baseline:  Goal status: INITIAL     PLAN:  PT FREQUENCY: 1-2x/week  PT DURATION: 8 weeks  PLANNED INTERVENTIONS: Therapeutic exercises, Therapeutic activity, Neuromuscular re-education, Balance training, Gait training, Patient/Family education, Self Care, Joint mobilization, Stair training, DME instructions, Aquatic Therapy, Dry Needling, Electrical stimulation, Cryotherapy, Moist heat, Taping, Manual therapy, and Re-evaluation.   PLAN FOR NEXT SESSION:  Land: Consider patella range of motion.  Review HEP.  Consider extension stretching.  Consider standing series including heel raises, supported march, hip abduction  bilateral.  Consider gym equipment at some point if patient can tolerate. Aquatics: Progressive weightbearing activities.  Gait training.  Hip and calf strengthening.  Mayer Camel, PTA 06/29/23 12:54 PM [facility] [address] [place], [place], [address] Phone: [phone]   Fax:  [phone]

## 2023-07-04 ENCOUNTER — Ambulatory Visit (HOSPITAL_BASED_OUTPATIENT_CLINIC_OR_DEPARTMENT_OTHER): Payer: Medicare Other | Admitting: Physical Therapy

## 2023-07-04 ENCOUNTER — Encounter (HOSPITAL_BASED_OUTPATIENT_CLINIC_OR_DEPARTMENT_OTHER): Payer: Self-pay | Admitting: Physical Therapy

## 2023-07-04 DIAGNOSIS — R2689 Other abnormalities of gait and mobility: Secondary | ICD-10-CM

## 2023-07-04 DIAGNOSIS — G8929 Other chronic pain: Secondary | ICD-10-CM

## 2023-07-04 DIAGNOSIS — M25562 Pain in left knee: Secondary | ICD-10-CM | POA: Diagnosis not present

## 2023-07-04 NOTE — Therapy (Signed)
OUTPATIENT PHYSICAL THERAPY LOWER EXTREMITY  TREATMENT   Patient Name: Troy [name] MRN: [id number] DOB:July 21, 1946, 77 y.o., male Today's Date: 07/04/2023  END OF SESSION:  PT End of Session - 07/04/23 1045     Visit Number 11    Number of Visits 16    Date for PT Re-Evaluation 07/11/23    Progress Note Due on Visit 19    PT Start Time 1032    PT Stop Time 1115    PT Time Calculation (min) 43 min    Activity Tolerance Patient tolerated treatment well    Behavior During Therapy WFL for tasks assessed/performed                Past Medical History:  Diagnosis Date   Abdominal distention    hernia   Anemia    Arthritis    big toes    Cancer (HCC)    prostate    Chronic kidney disease    Hypertension    Lumbar herniated disc    L5   Pneumonia    Rash    yeast infection from bladder sling surgery    Sciatica of right side 09/01/2011   right leg-tx. Tylenol   Sleep apnea    cpap   Past Surgical History:  Procedure Laterality Date   HERNIA REPAIR  September 01, 2010   INCONTINENCE SURGERY  Jan 31, 2011   INSERTION OF MESH  03/10/2015   Procedure: INSERTION OF MESH;  Surgeon: Luretha Murphy, MD;  Location: [facility];  Service: General;;   IR RADIOLOGIST EVAL & MGMT  05/15/2018   IR RADIOLOGIST EVAL & MGMT  07/13/2022   LAPAROSCOPIC NISSEN FUNDOPLICATION N/A 03/10/2015   Procedure: LAPAROSCOPIC NISSEN FUNDOPLICATION AND HIATAL HERNIA REPAIR;  Surgeon: Luretha Murphy, MD;  Location: [facility];  Service: General;  Laterality: N/A;   LAPAROTOMY N/A 05/29/2022   Procedure: laparoscopic assisted small bowel resection with anastamosis;  Surgeon: Sheliah Hatch, De Blanch, MD;  Location: [facility];  Service: General;  Laterality: N/A;   PROSTATECTOMY  reb 9, 2010   RADIOLOGY WITH ANESTHESIA N/A 08/30/2022   Procedure: RENAL CRYOABLATION;  Surgeon: Simonne Come, MD;  Location: [facility];  Service: Anesthesiology;  Laterality: N/A;   TONSILLECTOMY     TRANSURETHRAL RESECTION OF BLADDER  TUMOR N/A 06/15/2022   Procedure: TRANSURETHRAL RESECTION OF BLADDER TUMOR (TURBT)/ CYSTOSCOPY/ BILATERAL RETROGRADE/;  Surgeon: Heloise Purpura, MD;  Location: [facility];  Service: Urology;  Laterality: N/A;  GENERAL ANESTHESIA WITH PARALYSIS   UPPER GI ENDOSCOPY N/A 03/10/2015   Procedure: UPPER GI ENDOSCOPY;  Surgeon: Luretha Murphy, MD;  Location: [facility];  Service: General;  Laterality: N/A;   VENTRAL HERNIA REPAIR  09/06/2011   Procedure: HERNIA REPAIR VENTRAL ADULT;  Surgeon: Valarie Merino, MD;  Location: [facility];  Service: General;  Laterality: N/A;   VENTRAL HERNIA REPAIR  09/06/2011   Procedure: LAPAROSCOPIC VENTRAL HERNIA;  Surgeon: Valarie Merino, MD;  Location: [facility];  Service: General;  Laterality: N/A;   Patient Active Problem List   Diagnosis Date Noted   Renal cell carcinoma of right kidney (HCC) 08/30/2022   SBO (small bowel obstruction) (HCC) 05/24/2022   Bladder mass 05/24/2022   Renal mass 05/24/2022   Anxiety 05/24/2022   HTN (hypertension) 05/24/2022   HLD (hyperlipidemia) 05/24/2022   Primary osteoarthritis of left shoulder 05/05/2020   Diverticulitis 05/03/2018   Status post laparoscopic Nissen fundoplication and repair of large hiatus hernia June 2016 03/10/2015   Ventral hernia-s/p repair  x 2 09/07/2011   History of prostate cancer-s/p prostatectomy 09/07/2011    PCP: Bernestine Amass MD   REFERRING PROVIDER: Weston Brass PA   REFERRING DIAG: Left Knee OA   THERAPY DIAG:  Chronic pain of left knee  Other abnormalities of gait and mobility  Rationale for Evaluation and Treatment: Rehabilitation  ONSET DATE: >3 years   SUBJECTIVE:   SUBJECTIVE STATEMENT: This is my last session in pool.. Pain is a 2/10 unless I don't move  Pt is scheduled to have Lt TKR surgery in December.  PERTINENT HISTORY: Arthritis hips left knee and left shoulder; Herniated lumbar disc; Prostate cancer,  PAIN:  Are you having pain? no: NPRS scale: 0   Pain location:  see above  Pain description: aching  Aggravating factors: standing and walking  Relieving factors: rest   PRECAUTIONS: None  RED FLAGS: None   WEIGHT BEARING RESTRICTIONS: No  FALLS:  Has patient fallen in last 6 months? Yes. Number of falls 1 fell outside last weekend; Breaking a branch.   LIVING ENVIRONMENT: 2 steps going into the house   OCCUPATION:  Works three days a week. Works Monday , Tuesday, Thursday   PLOF: Independent  PATIENT GOALS:  Get stronger before the surgery, less pain   NEXT MD VISIT:  October 25th  Surgery Dec 17   OBJECTIVE:   DIAGNOSTIC FINDINGS:  Per patient left knee patella degeneration   PATIENT SURVEYS:  FOTO risk adjusted 51% 06/20/23 47%  COGNITION: Overall cognitive status: Within functional limits for tasks assessed     SENSATION: WFL  EDEMA:  No noticeable edema   MUSCLE LENGTH:   POSTURE: No Significant postural limitations  PALPATION: No unexpected TTP   LOWER EXTREMITY ROM:  Passive ROM Right eval Left eval Left 06/27/23  Hip flexion      Hip extension     Hip abduction     Hip adduction     Hip internal rotation     Hip external rotation     Knee flexion  No pain at end range   Knee extension  -3 from straight no pain  0  Ankle dorsiflexion     Ankle plantarflexion     Ankle inversion     Ankle eversion      (Blank rows = not tested)  Limited patella motion   LOWER EXTREMITY MMT:  MMT Right eval Left eval Right  10/4 Left  10/4  Hip flexion 25.3 28.2 38.7 39.6  Hip extension      Hip abduction 34.0 33.4 35.3 36.1  Hip adduction      Hip internal rotation      Hip external rotation      Knee flexion      Knee extension 31.0 34.3 31.6 36.1  Ankle dorsiflexion      Ankle plantarflexion      Ankle inversion      Ankle eversion       (Blank rows = not tested)   Functional testing: 06/27/23  4 stage balance 1&2 pass; Tandem x 10s; SLS x 3  TUG: 14.47 GAIT: Lateral movement away from  the left leg  TODAY'S TREATMENT:  07/04/23 Pt seen for aquatic therapy today.  Treatment took place in water 3.5-4.75 ft in depth at the [facility] pool. Temp of water was 91.  Pt entered/exited the pool via stairs using step to pattern up, step through pattern down with hand rail.  *walking forward, backward with cues for heel/toe and arm swing  *Solid noodle stomp UE support on yellow HB x 10 slow, x 10 fast R/L *SLS R/L x 20 sec ea no ue support slight sculling *3 way stretch R/L *Seated on lift: LAQ; hip flex/ext; hip add/abd; cycling *STS from 3rd step from bottom of pool 2x 5 *adductor set using orange BB *STS with add set from 3rd step (difficult) x5 *  side stepping x 2 laps with arm addct/ abdct, -> x 2 laps with yellow hand floats  *walking forward and back between exercises for recovery.   Pt requires the buoyancy and hydrostatic pressure of water for support, and to offload joints by unweighting joint load by at least 50 % in navel deep water and by at least 75-80% in chest to neck deep water.  Viscosity of the water is needed for resistance of strengthening. Water current perturbations provides challenge to standing balance requiring increased core activation.   06/29/23 Therapeutic exercise: *Recumbent bike L 1.5 x 5 min for warm up * red band above knees:  Side stepping with  with intermittent UE on rail 12 ft R/L x 3, with cues to slow speed and increase step height; monster walk forward; tandem gait forward/ backward with intermittent UE to steady x 12 ft x 2 * SLS - multiple reps, up to 3 sec L, 7 sec R *  tandem stance x 20 sec each LE forward * staggered stance STS on elevated table x 5 each LE (challenge!);standard STS with slow controlled descent to surface x 5 (cues for hip hinge and forward arm reach) * seated resisted LAQ red band  at ankles x 3sec x 10 each LE * sidelying R/L hip abdct x 12; single leg clams x 10 * seated fig 4 stretch L/R/L x 15s (tight on L) * seated hamstring stretch R/L x 15s x 2 each LE   06/27/23 Pt seen for aquatic therapy today.  Treatment took place in water 3.5-4.75 ft in depth at the [facility] pool. Temp of water was 91.  Pt entered/exited the pool via stairs using step to pattern up, step through pattern down with hand rail.  *walking forward, backward with cues for heel/toe and arm swing  *  side stepping x 2 laps with arm addct/ abdct, -> x 2 laps with yellow hand floats  *Solid noodle stomp UE support on yellow HB x 10 slow, x 10 fast R/L * UE support on yellow hand floats:  tandem stance Leading R then L ue shoulder add/abd x 10 *Seated on 3rd step: hip add/abd and slr x 20 *STS from 3rd step from bottom of pool x 5 *walking forward and back between exercises for recovery.   Pt requires the buoyancy and hydrostatic pressure of water for support, and to offload joints by unweighting joint load by at least 50 % in navel deep water and by at least 75-80% in chest to neck deep water.  Viscosity of the water is needed for resistance of strengthening. Water current perturbations provides challenge to standing balance requiring increased core activation.    10/4 MMT *NuStep L4: 3.5 min LE only * supine SLR with 5 sec hold at  top, x 5 each LE * bridge x 10, 3 sec hold in hip ext * resisted side stepping with green band at thighs x 76ft R/L ->  red band at ankles x 51ft R/L  * STS from chair x 3 -> elevated table in staggered stance  * quad stretch with foot on chair * seated quad stretch with single foot under chair     PATIENT EDUCATION:  Education details: aquatic therapy exercise progressions/modifications  Person educated: Patient Education method: Explanation, Demonstration, Tactile cues, Verbal cues, and Handouts Education comprehension: verbalized understanding,  returned demonstration, verbal cues required, tactile cues required, and needs further education  HOME EXERCISE PROGRAM:  Access Code: [email or web address] URL: [email or web address] Date: 06/15/2023 Prepared by: [facility] [address]  Exercises - Supine Active Straight Leg Raise  - 1 x daily - 7 x weekly - 2 sets - 10 reps - Supine Bridge  - 1 x daily - 7 x weekly - 2 sets - 10 reps - Side Stepping with Resistance at Feet  - 1 x daily - 7 x weekly - 2 sets - 10 reps - Seated Knee Extension with Resistance  - 1 x daily - 7 x weekly - 2 sets - 10 reps - Staggered Sit-to-Stand  - 1 x daily - 7 x weekly - 1 sets - 10 reps - Quadricep Stretch with Chair and Counter Support  - 1 x daily - 7 x weekly - 2-3 reps - 20 seconds  hold  ASSESSMENT:  CLINICAL IMPRESSION: Improved SLS able to hold unsupported x 20s.  Good toleration to progressive quad and hamstring strengthening. Cues for core stabilization for added balance and control with exercises. No pain reports.  He reports inability to tolerate standing > 1/2 hour without increase in pain.  He has been walking ~ 1/2 mile a few times a week but with increase in pain >2/10. Pt has reached his max potential in pool.  He is not issued an aquatic hep as he does not have pool access.  Will continue with land based intervention for increased loading for strengthening and stretching for progression towards full knee ROM prior to surgery.  He vu of his responsibility to complete HEP as instructed by land PT to prepare for pending surgery   Initial Impression Patient is a 77 year old male who presents with left knee OA.  He has had a progressive increase in pain over the past 3 years.  He has increased pain when he stands and walks.  At 1 point he was walking over 5 miles.  At this point he cannot walk more than half mile without increased pain.  He recently had a steroid injection  which helped his pain.  He has full pain-free range  of motion.  He has limited patellar motion.  He has mild strength deficits in his quad and glutes.  He presents with antalgic gait.  He is scheduled for total knee replacement in December.  He will like to a pool program and the land program to maximize his strength and mobility prior to his surgery.  He would benefit from skilled therapy to develop this program.  OBJECTIVE IMPAIRMENTS: decreased activity tolerance, difficulty walking, decreased ROM, decreased strength, and pain  ACTIVITY LIMITATIONS: standing, squatting, stairs, and locomotion level  PARTICIPATION LIMITATIONS: meal prep, cleaning, laundry, driving, shopping, community activity, occupation, and yard work   PERSONAL FACTORS: 1-2 comorbidities:    are also affecting patient's functional outcome.   REHAB POTENTIAL: Excellent  CLINICAL DECISION MAKING: Stable/uncomplicated  EVALUATION COMPLEXITY: Low   GOALS: Goals reviewed with patient? Yes  SHORT TERM GOALS: Target date: 06/13/2023   Patient will increase gross left lower extremity strength by 5 pounds Baseline: see chart above Goal status:Partially met - 06/15/23  2.  Patient will be independent with basic exercise program on land and in the pool. Baseline:  Goal status: Met land based 06/20/23/ pool deferred due to no access.  3.  Patient will demonstrate improved patella motion Baseline:  Goal stat  INITIAL   LONG TERM GOALS: Target date: 07/11/2023    Patient will stand for greater than 1/2-hour without increased pain Baseline:  Goal status: INITIAL  2.  Patient will ambulate community distance with less than 2 out of 10 pain Baseline:  Goal status: INITIAL  3.  Patient will have complete complete prehab program Baseline:  Goal status: INITIAL     PLAN:  PT FREQUENCY: 1-2x/week  PT DURATION: 8 weeks  PLANNED INTERVENTIONS: Therapeutic exercises, Therapeutic activity, Neuromuscular re-education, Balance training, Gait training,  Patient/Family education, Self Care, Joint mobilization, Stair training, DME instructions, Aquatic Therapy, Dry Needling, Electrical stimulation, Cryotherapy, Moist heat, Taping, Manual therapy, and Re-evaluation.   PLAN FOR NEXT SESSION:  Land: Consider patella range of motion.  Review HEP.  Consider extension stretching.  Consider standing series including heel raises, supported march, hip abduction bilateral.  Consider gym equipment at some point if patient can tolerate. Aquatics: Progressive weightbearing activities.  Gait training.  Hip and calf strengthening.  Corrie Dandy [place]) Ishmael Berkovich MPT 07/04/23 11:41 AM [facility] [address] [place], [place], [address] Phone: [phone]   Fax:  [phone]

## 2023-07-06 ENCOUNTER — Ambulatory Visit (HOSPITAL_BASED_OUTPATIENT_CLINIC_OR_DEPARTMENT_OTHER): Payer: Medicare Other | Admitting: Physical Therapy

## 2023-07-06 ENCOUNTER — Encounter (HOSPITAL_BASED_OUTPATIENT_CLINIC_OR_DEPARTMENT_OTHER): Payer: Self-pay | Admitting: Physical Therapy

## 2023-07-06 DIAGNOSIS — G8929 Other chronic pain: Secondary | ICD-10-CM

## 2023-07-06 DIAGNOSIS — R2689 Other abnormalities of gait and mobility: Secondary | ICD-10-CM

## 2023-07-06 DIAGNOSIS — M25562 Pain in left knee: Secondary | ICD-10-CM | POA: Diagnosis not present

## 2023-07-06 NOTE — Therapy (Signed)
OUTPATIENT PHYSICAL THERAPY LOWER EXTREMITY  TREATMENT   Patient Name: Troy [name] MRN: [id number] DOB:01/26/1946, 77 y.o., male Today's Date: 07/06/2023  END OF SESSION:  PT End of Session - 07/06/23 0930     Visit Number 12    Number of Visits 16    Date for PT Re-Evaluation 07/11/23    PT Start Time 0927    PT Stop Time 1007    PT Time Calculation (min) 40 min    Activity Tolerance Patient tolerated treatment well    Behavior During Therapy [facility] for tasks assessed/performed                Past Medical History:  Diagnosis Date   Abdominal distention    hernia   Anemia    Arthritis    big toes    Cancer (HCC)    prostate    Chronic kidney disease    Hypertension    Lumbar herniated disc    L5   Pneumonia    Rash    yeast infection from bladder sling surgery    Sciatica of right side 09/01/2011   right leg-tx. Tylenol   Sleep apnea    cpap   Past Surgical History:  Procedure Laterality Date   HERNIA REPAIR  September 01, 2010   INCONTINENCE SURGERY  Jan 31, 2011   INSERTION OF MESH  03/10/2015   Procedure: INSERTION OF MESH;  Surgeon: Luretha Murphy, MD;  Location: [facility];  Service: General;;   IR RADIOLOGIST EVAL & MGMT  05/15/2018   IR RADIOLOGIST EVAL & MGMT  07/13/2022   LAPAROSCOPIC NISSEN FUNDOPLICATION N/A 03/10/2015   Procedure: LAPAROSCOPIC NISSEN FUNDOPLICATION AND HIATAL HERNIA REPAIR;  Surgeon: Luretha Murphy, MD;  Location: [facility];  Service: General;  Laterality: N/A;   LAPAROTOMY N/A 05/29/2022   Procedure: laparoscopic assisted small bowel resection with anastamosis;  Surgeon: Sheliah Hatch, De Blanch, MD;  Location: [facility];  Service: General;  Laterality: N/A;   PROSTATECTOMY  reb 9, 2010   RADIOLOGY WITH ANESTHESIA N/A 08/30/2022   Procedure: RENAL CRYOABLATION;  Surgeon: Simonne Come, MD;  Location: [facility];  Service: Anesthesiology;  Laterality: N/A;   TONSILLECTOMY     TRANSURETHRAL RESECTION OF BLADDER TUMOR N/A 06/15/2022   Procedure:  TRANSURETHRAL RESECTION OF BLADDER TUMOR (TURBT)/ CYSTOSCOPY/ BILATERAL RETROGRADE/;  Surgeon: Heloise Purpura, MD;  Location: [facility];  Service: Urology;  Laterality: N/A;  GENERAL ANESTHESIA WITH PARALYSIS   UPPER GI ENDOSCOPY N/A 03/10/2015   Procedure: UPPER GI ENDOSCOPY;  Surgeon: Luretha Murphy, MD;  Location: [facility];  Service: General;  Laterality: N/A;   VENTRAL HERNIA REPAIR  09/06/2011   Procedure: HERNIA REPAIR VENTRAL ADULT;  Surgeon: Valarie Merino, MD;  Location: [facility];  Service: General;  Laterality: N/A;   VENTRAL HERNIA REPAIR  09/06/2011   Procedure: LAPAROSCOPIC VENTRAL HERNIA;  Surgeon: Valarie Merino, MD;  Location: [facility];  Service: General;  Laterality: N/A;   Patient Active Problem List   Diagnosis Date Noted   Renal cell carcinoma of right kidney (HCC) 08/30/2022   SBO (small bowel obstruction) (HCC) 05/24/2022   Bladder mass 05/24/2022   Renal mass 05/24/2022   Anxiety 05/24/2022   HTN (hypertension) 05/24/2022   HLD (hyperlipidemia) 05/24/2022   Primary osteoarthritis of left shoulder 05/05/2020   Diverticulitis 05/03/2018   Status post laparoscopic Nissen fundoplication and repair of large hiatus hernia June 2016 03/10/2015   Ventral hernia-s/p repair x 2 09/07/2011   History of prostate cancer-s/p  prostatectomy 09/07/2011    PCP: Bernestine Amass MD   REFERRING PROVIDER: Weston Brass PA   REFERRING DIAG: Left Knee OA   THERAPY DIAG:  Chronic pain of left knee  Other abnormalities of gait and mobility  Rationale for Evaluation and Treatment: Rehabilitation  ONSET DATE: >3 years   SUBJECTIVE:   SUBJECTIVE STATEMENT: The patient reports his knee is about the same. He has been to the MD and will have his surgery on 12/17.   Pt is scheduled to have Lt TKR surgery in December.  PERTINENT HISTORY: Arthritis hips left knee and left shoulder; Herniated lumbar disc; Prostate cancer,  PAIN:  Are you having pain? no: NPRS scale: 02/10  Pain  location: see above  Pain description: aching  Aggravating factors: standing and walking  Relieving factors: rest   PRECAUTIONS: None  RED FLAGS: None   WEIGHT BEARING RESTRICTIONS: No  FALLS:  Has patient fallen in last 6 months? Yes. Number of falls 1 fell outside last weekend; Breaking a branch.   LIVING ENVIRONMENT: 2 steps going into the house   OCCUPATION:  Works three days a week. Works Monday , Tuesday, Thursday   PLOF: Independent  PATIENT GOALS:  Get stronger before the surgery, less pain   NEXT MD VISIT:  October 25th  Surgery Dec 17   OBJECTIVE:   DIAGNOSTIC FINDINGS:  Per patient left knee patella degeneration   PATIENT SURVEYS:  FOTO risk adjusted 51% 06/20/23 47%  COGNITION: Overall cognitive status: Within functional limits for tasks assessed     SENSATION: WFL  EDEMA:  No noticeable edema   MUSCLE LENGTH:   POSTURE: No Significant postural limitations  PALPATION: No unexpected TTP   LOWER EXTREMITY ROM:  Passive ROM Right eval Left eval Left 06/27/23  Hip flexion      Hip extension     Hip abduction     Hip adduction     Hip internal rotation     Hip external rotation     Knee flexion  No pain at end range   Knee extension  -3 from straight no pain  0  Ankle dorsiflexion     Ankle plantarflexion     Ankle inversion     Ankle eversion      (Blank rows = not tested)  Limited patella motion   LOWER EXTREMITY MMT:  MMT Right eval Left eval Right  10/4 Left  10/4  Hip flexion 25.3 28.2 38.7 39.6  Hip extension      Hip abduction 34.0 33.4 35.3 36.1  Hip adduction      Hip internal rotation      Hip external rotation      Knee flexion      Knee extension 31.0 34.3 31.6 36.1  Ankle dorsiflexion      Ankle plantarflexion      Ankle inversion      Ankle eversion       (Blank rows = not tested)   Functional testing: 06/27/23  4 stage balance 1&2 pass; Tandem x 10s; SLS x 3  TUG: 14.47 GAIT: Lateral movement  away from the left leg  TODAY'S TREATMENT:  10/25 Nu-step 5 min  SLR 2x10 each leg.  Hip abduction 2x10  Quad set 2x10   Heel raises 2x10   Updated and reviewed HEP   Cybex leg press 3 x 10 50 pounds  Showed patient walking track of stairs patient go up and down 12 steps  07/04/23 Pt seen for aquatic therapy today.  Treatment took place in water 3.5-4.75 ft in depth at the [facility] pool. Temp of water was 91.  Pt entered/exited the pool via stairs using step to pattern up, step through pattern down with hand rail.  *walking forward, backward with cues for heel/toe and arm swing  *Solid noodle stomp UE support on yellow HB x 10 slow, x 10 fast R/L *SLS R/L x 20 sec ea no ue support slight sculling *3 way stretch R/L *Seated on lift: LAQ; hip flex/ext; hip add/abd; cycling *STS from 3rd step from bottom of pool 2x 5 *adductor set using orange BB *STS with add set from 3rd step (difficult) x5 *  side stepping x 2 laps with arm addct/ abdct, -> x 2 laps with yellow hand floats  *walking forward and back between exercises for recovery.   Pt requires the buoyancy and hydrostatic pressure of water for support, and to offload joints by unweighting joint load by at least 50 % in navel deep water and by at least 75-80% in chest to neck deep water.  Viscosity of the water is needed for resistance of strengthening. Water current perturbations provides challenge to standing balance requiring increased core activation.   06/29/23 Therapeutic exercise: *Recumbent bike L 1.5 x 5 min for warm up * red band above knees:  Side stepping with  with intermittent UE on rail 12 ft R/L x 3, with cues to slow speed and increase step height; monster walk forward; tandem gait forward/ backward with intermittent UE to steady x 12 ft x 2 * SLS - multiple reps, up to 3 sec  L, 7 sec R *  tandem stance x 20 sec each LE forward * staggered stance STS on elevated table x 5 each LE (challenge!);standard STS with slow controlled descent to surface x 5 (cues for hip hinge and forward arm reach) * seated resisted LAQ red band at ankles x 3sec x 10 each LE * sidelying R/L hip abdct x 12; single leg clams x 10 * seated fig 4 stretch L/R/L x 15s (tight on L) * seated hamstring stretch R/L x 15s x 2 each LE   06/27/23 Pt seen for aquatic therapy today.  Treatment took place in water 3.5-4.75 ft in depth at the [facility] pool. Temp of water was 91.  Pt entered/exited the pool via stairs using step to pattern up, step through pattern down with hand rail.  *walking forward, backward with cues for heel/toe and arm swing  *  side stepping x 2 laps with arm addct/ abdct, -> x 2 laps with yellow hand floats  *Solid noodle stomp UE support on yellow HB x 10 slow, x 10 fast R/L * UE support on yellow hand floats:  tandem stance Leading R then L ue shoulder add/abd x 10 *Seated on 3rd step: hip add/abd and slr x 20 *STS from 3rd step from bottom of pool x 5 *walking forward and back between exercises for recovery.   Pt requires the buoyancy and hydrostatic pressure of water for support, and to offload joints by unweighting joint load by at least 50 % in navel deep water  and by at least 75-80% in chest to neck deep water.  Viscosity of the water is needed for resistance of strengthening. Water current perturbations provides challenge to standing balance requiring increased core activation.    10/4 MMT *NuStep L4: 3.5 min LE only * supine SLR with 5 sec hold at top, x 5 each LE * bridge x 10, 3 sec hold in hip ext * resisted side stepping with green band at thighs x 60ft R/L ->  red band at ankles x 37ft R/L  * STS from chair x 3 -> elevated table in staggered stance  * quad stretch with foot on chair * seated quad stretch with single foot under chair      PATIENT EDUCATION:  Education details: aquatic therapy exercise progressions/modifications  Person educated: Patient Education method: Explanation, Demonstration, Tactile cues, Verbal cues, and Handouts Education comprehension: verbalized understanding, returned demonstration, verbal cues required, tactile cues required, and needs further education  HOME EXERCISE PROGRAM:  Access Code: [email or web address] URL: [email or web address] Date: 06/15/2023 Prepared by: [facility] - [address]  Exercises - Supine Active Straight Leg Raise  - 1 x daily - 7 x weekly - 2 sets - 10 reps - Supine Bridge  - 1 x daily - 7 x weekly - 2 sets - 10 reps - Side Stepping with Resistance at Feet  - 1 x daily - 7 x weekly - 2 sets - 10 reps - Seated Knee Extension with Resistance  - 1 x daily - 7 x weekly - 2 sets - 10 reps - Staggered Sit-to-Stand  - 1 x daily - 7 x weekly - 1 sets - 10 reps - Quadricep Stretch with Chair and Counter Support  - 1 x daily - 7 x weekly - 2-3 reps - 20 seconds  hold  ASSESSMENT:  CLINICAL IMPRESSION: The patient did not have a significant increase in pain with exercises. We updated his HEP. He found some challenge in SLR. We reviewed how to use his HEP at home. He will have his surgery in December. We also brought him out to the gym. We showed I'm upstairs and the walking track. We also worked on the leg press. He tolerated well. Will progress per his response to this visit.  He is advised to let us know how he feels the next few days we will progress with accordingly  Initial Impression Patient is a 77 year old male who presents with left knee OA.  He has had a progressive increase in pain over the past 3 years.  He has increased pain when he stands and walks.  At 1 point he was walking over 5 miles.  At this point he cannot walk more than half mile without increased pain.  He recently had a steroid injection  which helped his pain.  He has full  pain-free range of motion.  He has limited patellar motion.  He has mild strength deficits in his quad and glutes.  He presents with antalgic gait.  He is scheduled for total knee replacement in December.  He will like to a pool program and the land program to maximize his strength and mobility prior to his surgery.  He would benefit from skilled therapy to develop this program.  OBJECTIVE IMPAIRMENTS: decreased activity tolerance, difficulty walking, decreased ROM, decreased strength, and pain  ACTIVITY LIMITATIONS: standing, squatting, stairs, and locomotion level  PARTICIPATION LIMITATIONS: meal prep, cleaning, laundry, driving, shopping, community activity, occupation, and yard work   PERSONAL FACTORS: 1-2  comorbidities:    are also affecting patient's functional outcome.   REHAB POTENTIAL: Excellent  CLINICAL DECISION MAKING: Stable/uncomplicated  EVALUATION COMPLEXITY: Low   GOALS: Goals reviewed with patient? Yes  SHORT TERM GOALS: Target date: 06/13/2023   Patient will increase gross left lower extremity strength by 5 pounds Baseline: see chart above Goal status:Partially met - 06/15/23  2.  Patient will be independent with basic exercise program on land and in the pool. Baseline:  Goal status: Met land based 06/20/23/ pool deferred due to no access.  3.  Patient will demonstrate improved patella motion Baseline:  Goal stat  INITIAL   LONG TERM GOALS: Target date: 07/11/2023    Patient will stand for greater than 1/2-hour without increased pain Baseline:  Goal status: INITIAL  2.  Patient will ambulate community distance with less than 2 out of 10 pain Baseline:  Goal status: INITIAL  3.  Patient will have complete complete prehab program Baseline:  Goal status: INITIAL     PLAN:  PT FREQUENCY: 1-2x/week  PT DURATION: 8 weeks  PLANNED INTERVENTIONS: Therapeutic exercises, Therapeutic activity, Neuromuscular re-education, Balance training, Gait  training, Patient/Family education, Self Care, Joint mobilization, Stair training, DME instructions, Aquatic Therapy, Dry Needling, Electrical stimulation, Cryotherapy, Moist heat, Taping, Manual therapy, and Re-evaluation.   PLAN FOR NEXT SESSION:  Land: Consider patella range of motion.  Review HEP.  Consider extension stretching.  Consider standing series including heel raises, supported march, hip abduction bilateral.  Consider gym equipment at some point if patient can tolerate. Aquatics: Progressive weightbearing activities.  Gait training.  Hip and calf strengthening.  [address] [place]) Ziemba MPT 07/06/23 9:35 AM [facility] [address] [place], [place], [address] Phone: [phone]   Fax:  [phone]

## 2023-07-10 ENCOUNTER — Ambulatory Visit (HOSPITAL_BASED_OUTPATIENT_CLINIC_OR_DEPARTMENT_OTHER): Payer: Medicare Other | Admitting: Physical Therapy

## 2023-07-11 ENCOUNTER — Ambulatory Visit (HOSPITAL_BASED_OUTPATIENT_CLINIC_OR_DEPARTMENT_OTHER): Payer: Medicare Other | Admitting: Physical Therapy

## 2023-07-11 ENCOUNTER — Telehealth (HOSPITAL_BASED_OUTPATIENT_CLINIC_OR_DEPARTMENT_OTHER): Payer: Self-pay | Admitting: Physical Therapy

## 2023-07-11 NOTE — Telephone Encounter (Signed)
Patient did not show for his physical therapy appointment.  Called and left voice mail regarding missed appointment and requested he return phone call to confirm next upcoming appointment.    Mayer Camel, PTA 07/11/23 11:32 AM [facility] [address] [place], [place], [address] Phone: [phone]   Fax:  [phone]

## 2023-07-13 ENCOUNTER — Encounter (HOSPITAL_BASED_OUTPATIENT_CLINIC_OR_DEPARTMENT_OTHER): Payer: Self-pay | Admitting: Physical Therapy

## 2023-07-13 ENCOUNTER — Ambulatory Visit (HOSPITAL_BASED_OUTPATIENT_CLINIC_OR_DEPARTMENT_OTHER): Payer: Medicare Other | Attending: Physician Assistant | Admitting: Physical Therapy

## 2023-07-13 DIAGNOSIS — R2689 Other abnormalities of gait and mobility: Secondary | ICD-10-CM | POA: Diagnosis present

## 2023-07-13 DIAGNOSIS — M25562 Pain in left knee: Secondary | ICD-10-CM | POA: Insufficient documentation

## 2023-07-13 DIAGNOSIS — G8929 Other chronic pain: Secondary | ICD-10-CM | POA: Insufficient documentation

## 2023-07-13 NOTE — Therapy (Signed)
OUTPATIENT PHYSICAL THERAPY LOWER EXTREMITY  TREATMENT/discharge   Patient Name: Troy [name] MRN: [id number] DOB:1945-12-15, 77 y.o., male Today's Date: 07/13/2023  END OF SESSION:  PT End of Session - 07/13/23 1051     Visit Number 13    Number of Visits 16    Date for PT Re-Evaluation 07/11/23    PT Start Time 0930    PT Stop Time 1013    PT Time Calculation (min) 43 min    Activity Tolerance Patient tolerated treatment well    Behavior During Therapy [facility] for tasks assessed/performed                 Past Medical History:  Diagnosis Date   Abdominal distention    hernia   Anemia    Arthritis    big toes    Cancer (HCC)    prostate    Chronic kidney disease    Hypertension    Lumbar herniated disc    L5   Pneumonia    Rash    yeast infection from bladder sling surgery    Sciatica of right side 09/01/2011   right leg-tx. Tylenol   Sleep apnea    cpap   Past Surgical History:  Procedure Laterality Date   HERNIA REPAIR  September 01, 2010   INCONTINENCE SURGERY  Jan 31, 2011   INSERTION OF MESH  03/10/2015   Procedure: INSERTION OF MESH;  Surgeon: Luretha Murphy, MD;  Location: [facility];  Service: General;;   IR RADIOLOGIST EVAL & MGMT  05/15/2018   IR RADIOLOGIST EVAL & MGMT  07/13/2022   LAPAROSCOPIC NISSEN FUNDOPLICATION N/A 03/10/2015   Procedure: LAPAROSCOPIC NISSEN FUNDOPLICATION AND HIATAL HERNIA REPAIR;  Surgeon: Luretha Murphy, MD;  [facility]: [facility];  Service: General;  Laterality: N/A;   LAPAROTOMY N/A 05/29/2022   Procedure: laparoscopic assisted small bowel resection with anastamosis;  Surgeon: Sheliah Hatch, De Blanch, MD;  Location: [facility];  Service: General;  Laterality: N/A;   PROSTATECTOMY  reb 9, 2010   RADIOLOGY WITH ANESTHESIA N/A 08/30/2022   Procedure: RENAL CRYOABLATION;  Surgeon: Simonne Come, MD;  [facility]: [facility];  Service: Anesthesiology;  Laterality: N/A;   TONSILLECTOMY     TRANSURETHRAL RESECTION OF BLADDER TUMOR N/A 06/15/2022    Procedure: TRANSURETHRAL RESECTION OF BLADDER TUMOR (TURBT)/ CYSTOSCOPY/ BILATERAL RETROGRADE/;  Surgeon: Heloise Purpura, MD;  [facility];  Service: Urology;  Laterality: N/A;  GENERAL ANESTHESIA WITH PARALYSIS   UPPER GI ENDOSCOPY N/A 03/10/2015   Procedure: UPPER GI ENDOSCOPY;  Surgeon: Luretha Murphy, MD;  [facility];  Service: General;  Laterality: N/A;   VENTRAL HERNIA REPAIR  09/06/2011   Procedure: HERNIA REPAIR VENTRAL ADULT;  Surgeon: Valarie Merino, MD;  [facility];  Service: General;  Laterality: N/A;   VENTRAL HERNIA REPAIR  09/06/2011   Procedure: LAPAROSCOPIC VENTRAL HERNIA;  Surgeon: Valarie Merino, MD;  [facility];  Service: General;  Laterality: N/A;   Patient Active Problem List   Diagnosis Date Noted   Renal cell carcinoma of right kidney (HCC) 08/30/2022   SBO (small bowel obstruction) (HCC) 05/24/2022   Bladder mass 05/24/2022   Renal mass 05/24/2022   Anxiety 05/24/2022   HTN (hypertension) 05/24/2022   HLD (hyperlipidemia) 05/24/2022   Primary osteoarthritis of left shoulder 05/05/2020   Diverticulitis 05/03/2018   Status post laparoscopic Nissen fundoplication and repair of large hiatus hernia June 2016 03/10/2015   Ventral hernia-s/p repair x 2 09/07/2011   History of prostate  cancer-s/p prostatectomy 09/07/2011    PCP: Bernestine Amass MD   REFERRING PROVIDER: Weston Brass PA   REFERRING DIAG: Left Knee OA   THERAPY DIAG:  Chronic pain of left knee  Other abnormalities of gait and mobility  Rationale for Evaluation and Treatment: Rehabilitation  ONSET DATE: >3 years   SUBJECTIVE:   SUBJECTIVE STATEMENT: The patient reports no significant increase in pain following last treatment.  He has been working on his exercises at home.  Pt is scheduled to have Lt TKR surgery in December.  He feels like he is ready for discharge from physical therapy at this time.  PERTINENT HISTORY: Arthritis hips left knee and left shoulder;  Herniated lumbar disc; Prostate cancer,  PAIN:  Are you having pain? no: NPRS scale: 02/10  Pain location: see above  Pain description: aching  Aggravating factors: standing and walking  Relieving factors: rest   PRECAUTIONS: None  RED FLAGS: None   WEIGHT BEARING RESTRICTIONS: No  FALLS:  Has patient fallen in last 6 months? Yes. Number of falls 1 fell outside last weekend; Breaking a branch.   LIVING ENVIRONMENT: 2 steps going into the house   OCCUPATION:  Works three days a week. Works Monday , Tuesday, Thursday   PLOF: Independent  PATIENT GOALS:  Get stronger before the surgery, less pain   NEXT MD VISIT:  October 25th  Surgery Dec 17   OBJECTIVE:   DIAGNOSTIC FINDINGS:  Per patient left knee patella degeneration   PATIENT SURVEYS:  FOTO risk adjusted 51% 06/20/23 47%  COGNITION: Overall cognitive status: Within functional limits for tasks assessed     SENSATION: WFL  EDEMA:  No noticeable edema   MUSCLE LENGTH:   POSTURE: No Significant postural limitations  PALPATION: No unexpected TTP   LOWER EXTREMITY ROM:  Passive ROM Right eval Left eval Left 06/27/23  Hip flexion      Hip extension     Hip abduction     Hip adduction     Hip internal rotation     Hip external rotation     Knee flexion  No pain at end range   Knee extension  -3 from straight no pain  0  Ankle dorsiflexion     Ankle plantarflexion     Ankle inversion     Ankle eversion      (Blank rows = not tested)  Limited patella motion   LOWER EXTREMITY MMT:  MMT Right eval Left eval Right  10/4 Left  10/4  Hip flexion 25.3 28.2 38.7 39.6  Hip extension      Hip abduction 34.0 33.4 35.3 36.1  Hip adduction      Hip internal rotation      Hip external rotation      Knee flexion      Knee extension 31.0 34.3 31.6 36.1  Ankle dorsiflexion      Ankle plantarflexion      Ankle inversion      Ankle eversion       (Blank rows = not tested)   Functional  testing: 06/27/23  4 stage balance 1&2 pass; Tandem x 10s; SLS x 3  TUG: 14.47 GAIT: Lateral movement away from the left leg  TODAY'S TREATMENT:  11/1 Nu-step 5 min  SLR 2x10 each leg.  Hip abduction 2x10  Quad set 2x10   Heel raises 2x10   Updated and reviewed HEP SAQ 3 x 10 each leg Standing march 3 x 10 each leg  Reviewed expectations following surgery.  Reviewed safety following surgery.    10/25 Nu-step 5 min  SLR 2x10 each leg.  Hip abduction 2x10  Quad set 2x10   Heel raises 2x10   Updated and reviewed HEP   Cybex leg press 3 x 10 50 pounds  Showed patient walking track of stairs patient go up and down 12 steps  07/04/23 Pt seen for aquatic therapy today.  Treatment took place in water 3.5-4.75 ft in depth at the [facility] pool. Temp of water was 91.  Pt entered/exited the pool via stairs using step to pattern up, step through pattern down with hand rail.  *walking forward, backward with cues for heel/toe and arm swing  *Solid noodle stomp UE support on yellow HB x 10 slow, x 10 fast R/L *SLS R/L x 20 sec ea no ue support slight sculling *3 way stretch R/L *Seated on lift: LAQ; hip flex/ext; hip add/abd; cycling *STS from 3rd step from bottom of pool 2x 5 *adductor set using orange BB *STS with add set from 3rd step (difficult) x5 *  side stepping x 2 laps with arm addct/ abdct, -> x 2 laps with yellow hand floats  *walking forward and back between exercises for recovery.   Pt requires the buoyancy and hydrostatic pressure of water for support, and to offload joints by unweighting joint load by at least 50 % in navel deep water and by at least 75-80% in chest to neck deep water.  Viscosity of the water is needed for resistance of strengthening. Water current perturbations provides challenge to standing balance requiring  increased core activation.   06/29/23 Therapeutic exercise: *Recumbent bike L 1.5 x 5 min for warm up * red band above knees:  Side stepping with  with intermittent UE on rail 12 ft R/L x 3, with cues to slow speed and increase step height; monster walk forward; tandem gait forward/ backward with intermittent UE to steady x 12 ft x 2 * SLS - multiple reps, up to 3 sec L, 7 sec R *  tandem stance x 20 sec each LE forward * staggered stance STS on elevated table x 5 each LE (challenge!);standard STS with slow controlled descent to surface x 5 (cues for hip hinge and forward arm reach) * seated resisted LAQ red band at ankles x 3sec x 10 each LE * sidelying R/L hip abdct x 12; single leg clams x 10 * seated fig 4 stretch L/R/L x 15s (tight on L) * seated hamstring stretch R/L x 15s x 2 each LE   06/27/23 Pt seen for aquatic therapy today.  Treatment took place in water 3.5-4.75 ft in depth at the [facility] pool. Temp of water was 91.  Pt entered/exited the pool via stairs using step to pattern up, step through pattern down with hand rail.  *walking forward, backward with cues for heel/toe and arm swing  *  side stepping x 2 laps with arm addct/ abdct, -> x 2 laps with yellow hand floats  *Solid noodle stomp UE support on yellow HB x 10 slow, x 10 fast R/L * UE support on yellow hand floats:  tandem stance Leading R then L ue shoulder add/abd x 10 *Seated on 3rd step:  hip add/abd and slr x 20 *STS from 3rd step from bottom of pool x 5 *walking forward and back between exercises for recovery.   Pt requires the buoyancy and hydrostatic pressure of water for support, and to offload joints by unweighting joint load by at least 50 % in navel deep water and by at least 75-80% in chest to neck deep water.  Viscosity of the water is needed for resistance of strengthening. Water current perturbations provides challenge to standing balance requiring increased core  activation.    10/4 MMT *NuStep L4: 3.5 min LE only * supine SLR with 5 sec hold at top, x 5 each LE * bridge x 10, 3 sec hold in hip ext * resisted side stepping with green band at thighs x 75ft R/L ->  red band at ankles x 29ft R/L  * STS from chair x 3 -> elevated table in staggered stance  * quad stretch with foot on chair * seated quad stretch with single foot under chair     PATIENT EDUCATION:  Education details: aquatic therapy exercise progressions/modifications  Person educated: Patient Education method: Explanation, Demonstration, Tactile cues, Verbal cues, and Handouts Education comprehension: verbalized understanding, returned demonstration, verbal cues required, tactile cues required, and needs further education  HOME EXERCISE PROGRAM:  Access Code: [email or web address] URL: [email or web address] Date: 06/15/2023 Prepared by: [facility] [address]  Exercises - Supine Active Straight Leg Raise  - 1 x daily - 7 x weekly - 2 sets - 10 reps - Supine Bridge  - 1 x daily - 7 x weekly - 2 sets - 10 reps - Side Stepping with Resistance at Feet  - 1 x daily - 7 x weekly - 2 sets - 10 reps - Seated Knee Extension with Resistance  - 1 x daily - 7 x weekly - 2 sets - 10 reps - Staggered Sit-to-Stand  - 1 x daily - 7 x weekly - 1 sets - 10 reps - Quadricep Stretch with Chair and Counter Support  - 1 x daily - 7 x weekly - 2-3 reps - 20 seconds  hold  ASSESSMENT:  CLINICAL IMPRESSION: The patient has reached max benefit from skilled therapy at this time. He has a full home and pool program. He continues to have pain in his knee but this is expected. He is scheduled for a TKA on 08/28/2023. He is scheduled for therapy following. We reviewed his complete HEP. He was educated on post-op things to expect and how to progress his activity. We will continue to progress as tolerated.     Initial Impression Patient is a 77 year old male who presents with left  knee OA.  He has had a progressive increase in pain over the past 3 years.  He has increased pain when he stands and walks.  At 1 point he was walking over 5 miles.  At this point he cannot walk more than half mile without increased pain.  He recently had a steroid injection  which helped his pain.  He has full pain-free range of motion.  He has limited patellar motion.  He has mild strength deficits in his quad and glutes.  He presents with antalgic gait.  He is scheduled for total knee replacement in December.  He will like to a pool program and the land program to maximize his strength and mobility prior to his surgery.  He would benefit from skilled therapy to develop this program.  OBJECTIVE IMPAIRMENTS: decreased activity tolerance,  difficulty walking, decreased ROM, decreased strength, and pain  ACTIVITY LIMITATIONS: standing, squatting, stairs, and locomotion level  PARTICIPATION LIMITATIONS: meal prep, cleaning, laundry, driving, shopping, community activity, occupation, and yard work   PERSONAL FACTORS: 1-2 comorbidities:    are also affecting patient's functional outcome.   REHAB POTENTIAL: Excellent  CLINICAL DECISION MAKING: Stable/uncomplicated  EVALUATION COMPLEXITY: Low   GOALS: Goals reviewed with patient? Yes  SHORT TERM GOALS: Target date: 06/13/2023   Patient will increase gross left lower extremity strength by 5 pounds Baseline: see chart above Goal status:Partially met - 06/15/23  2.  Patient will be independent with basic exercise program on land and in the pool. Baseline:  Goal status: Met land based 06/20/23/ pool deferred due to no access.  3.  Patient will demonstrate improved patella motion Baseline:  Goal stat  INITIAL   LONG TERM GOALS: Target date: 07/11/2023    Patient will stand for greater than 1/2-hour without increased pain Baseline:  Goal status: INITIAL  2.  Patient will ambulate community distance with less than 2 out of 10  pain Baseline:  Goal status: INITIAL  3.  Patient will have complete complete prehab program Baseline:  Goal status: INITIAL     PLAN:  PT FREQUENCY: 1-2x/week  PT DURATION: 8 weeks  PLANNED INTERVENTIONS: Therapeutic exercises, Therapeutic activity, Neuromuscular re-education, Balance training, Gait training, Patient/Family education, Self Care, Joint mobilization, Stair training, DME instructions, Aquatic Therapy, Dry Needling, Electrical stimulation, Cryotherapy, Moist heat, Taping, Manual therapy, and Re-evaluation.   PLAN FOR NEXT SESSION:  Land: Consider patella range of motion.  Review HEP.  Consider extension stretching.  Consider standing series including heel raises, supported march, hip abduction bilateral.  Consider gym equipment at some point if patient can tolerate. Aquatics: Progressive weightbearing activities.  Gait training.  Hip and calf strengthening.  [address] [place]) Ziemba MPT 07/13/23 11:01 AM [facility] [address] [place], [place], [address] Phone: [phone]   Fax:  [phone]

## 2023-08-06 ENCOUNTER — Encounter (HOSPITAL_BASED_OUTPATIENT_CLINIC_OR_DEPARTMENT_OTHER): Payer: Medicare Other

## 2023-08-31 ENCOUNTER — Other Ambulatory Visit: Payer: Self-pay

## 2023-08-31 ENCOUNTER — Ambulatory Visit (HOSPITAL_BASED_OUTPATIENT_CLINIC_OR_DEPARTMENT_OTHER): Payer: Medicare Other | Attending: Physician Assistant | Admitting: Physical Therapy

## 2023-08-31 ENCOUNTER — Encounter (HOSPITAL_BASED_OUTPATIENT_CLINIC_OR_DEPARTMENT_OTHER): Payer: Self-pay | Admitting: Physical Therapy

## 2023-08-31 DIAGNOSIS — G8929 Other chronic pain: Secondary | ICD-10-CM | POA: Diagnosis present

## 2023-08-31 DIAGNOSIS — M25562 Pain in left knee: Secondary | ICD-10-CM | POA: Insufficient documentation

## 2023-08-31 DIAGNOSIS — R2689 Other abnormalities of gait and mobility: Secondary | ICD-10-CM | POA: Diagnosis present

## 2023-08-31 DIAGNOSIS — M25662 Stiffness of left knee, not elsewhere classified: Secondary | ICD-10-CM | POA: Diagnosis present

## 2023-08-31 NOTE — Therapy (Signed)
OUTPATIENT PHYSICAL THERAPY EVALUATION   Patient Name: Troy [name] MRN: [id number] DOB:1946/09/11, 77 y.o., male Today's Date: 08/31/2023  END OF SESSION:  PT End of Session - 08/31/23 1455     Visit Number 1    Number of Visits 17    Date for PT Re-Evaluation 10/26/22    Authorization Type [employer] MCR    Progress Note Due on Visit 10    PT Start Time 0100    PT Stop Time 0138    PT Time Calculation (min) 38 min    Activity Tolerance Patient tolerated treatment well    Behavior During Therapy WFL for tasks assessed/performed             Past Medical History:  Diagnosis Date   Abdominal distention    hernia   Anemia    Arthritis    big toes    Cancer (HCC)    prostate    Chronic kidney disease    Hypertension    Lumbar herniated disc    L5   Pneumonia    Rash    yeast infection from bladder sling surgery    Sciatica of right side 09/01/2011   right leg-tx. Tylenol   Sleep apnea    cpap   Past Surgical History:  Procedure Laterality Date   HERNIA REPAIR  September 01, 2010   INCONTINENCE SURGERY  Jan 31, 2011   INSERTION OF MESH  03/10/2015   Procedure: INSERTION OF MESH;  Surgeon: Luretha Murphy, MD;  [facility]: [facility];  Service: General;;   IR RADIOLOGIST EVAL & MGMT  05/15/2018   IR RADIOLOGIST EVAL & MGMT  07/13/2022   LAPAROSCOPIC NISSEN FUNDOPLICATION N/A 03/10/2015   Procedure: LAPAROSCOPIC NISSEN FUNDOPLICATION AND HIATAL HERNIA REPAIR;  Surgeon: Luretha Murphy, MD;  [facility];  Service: General;  Laterality: N/A;   LAPAROTOMY N/A 05/29/2022   Procedure: laparoscopic assisted small bowel resection with anastamosis;  Surgeon: Sheliah Hatch, De Blanch, MD;  [facility];  Service: General;  Laterality: N/A;   PROSTATECTOMY  reb 9, 2010   RADIOLOGY WITH ANESTHESIA N/A 08/30/2022   Procedure: RENAL CRYOABLATION;  Surgeon: Simonne Come, MD;  [facility];  Service: Anesthesiology;  Laterality: N/A;   TONSILLECTOMY     TRANSURETHRAL RESECTION OF  BLADDER TUMOR N/A 06/15/2022   Procedure: TRANSURETHRAL RESECTION OF BLADDER TUMOR (TURBT)/ CYSTOSCOPY/ BILATERAL RETROGRADE/;  Surgeon: Heloise Purpura, MD;  [facility];  Service: Urology;  Laterality: N/A;  GENERAL ANESTHESIA WITH PARALYSIS   UPPER GI ENDOSCOPY N/A 03/10/2015   Procedure: UPPER GI ENDOSCOPY;  Surgeon: Luretha Murphy, MD;  [facility];  Service: General;  Laterality: N/A;   VENTRAL HERNIA REPAIR  09/06/2011   Procedure: HERNIA REPAIR VENTRAL ADULT;  Surgeon: Valarie Merino, MD;  [facility];  Service: General;  Laterality: N/A;   VENTRAL HERNIA REPAIR  09/06/2011   Procedure: LAPAROSCOPIC VENTRAL HERNIA;  Surgeon: Valarie Merino, MD;  [facility];  Service: General;  Laterality: N/A;   Patient Active Problem List   Diagnosis Date Noted   Renal cell carcinoma of right kidney (HCC) 08/30/2022   SBO (small bowel obstruction) (HCC) 05/24/2022   Bladder mass 05/24/2022   Renal mass 05/24/2022   Anxiety 05/24/2022   HTN (hypertension) 05/24/2022   HLD (hyperlipidemia) 05/24/2022   Primary osteoarthritis of left shoulder 05/05/2020   Diverticulitis 05/03/2018   Status post laparoscopic Nissen fundoplication and repair of large hiatus hernia June 2016 03/10/2015   Ventral hernia-s/p  repair x 2 09/07/2011   History of prostate cancer-s/p prostatectomy 09/07/2011     REFERRING PROVIDER:  Ollen Gross, MD    REFERRING DIAG: [phone] (ICD-10-CM) - Presence of left artificial knee joint   S/p Lt TKA  Rationale for Evaluation and Treatment: Rehabilitation  THERAPY DIAG:  Chronic pain of left knee  Other abnormalities of gait and mobility  Stiffness of left knee, not elsewhere classified  ONSET DATE: DOS 12/17   SUBJECTIVE:                                                                                                                                                                                           SUBJECTIVE STATEMENT: Took Oxy about  45 min ago.   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 Yes: NPRS scale: 6 Pain location: Lt knee Pain description: sore Aggravating factors: standing Relieving factors: meds  PRECAUTIONS:  None  RED FLAGS: None   WEIGHT BEARING RESTRICTIONS:  No  FALLS:  Has patient fallen in last 6 months? No  LIVING ENVIRONMENT: 2 steps going into the house   OCCUPATION:  Works three days a week. 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  PATIENT SURVEYS:  FOTO 30  COGNITIVE STATUS: Within functional limits for tasks assessed   SENSATION: WFL  EDEMA:  Yes: significant vs Rt   GAIT: Eval: amb with RW, antalgic gait.    Body Part #1 Knee  LOWER EXTREMITY ROM:     Passive  Left eval  Knee flexion 90  Knee extension -4   (Blank rows = not tested)   Eval: minimal quad activation noted                                                                                                                           TREATMENT DATE:  Treatment                            eval 12/20:  Quad set Heel slide Seated hamstring stretch Standing lateral weight shift, elevated leg edema mob Recumb bike rocking    PATIENT EDUCATION:  Education details: Teacher, music of condition, POC, HEP, exercise form/rationale Person educated: Patient Education method: Explanation, Demonstration, Tactile cues, Verbal cues, and Handouts Education comprehension: verbalized understanding, returned demonstration, verbal cues required, tactile cues required, and needs further education  HOME EXERCISE PROGRAM:  [email or web address]  Access Code: [email or web address]   ASSESSMENT:  CLINICAL IMPRESSION: Patient is a 77 y.o. M who was seen  today for physical therapy evaluation and treatment for s/p Lt TKA. Pt reports right knee is also popping frequently at this point. Has not been using ice as he does nto feel it penetrates the bandage. C/o severe pain when moving into a standing position and accepting weight.      REHAB POTENTIAL: Good  CLINICAL DECISION MAKING: Stable/uncomplicated  EVALUATION COMPLEXITY: Low   GOALS: Goals reviewed with patient? Yes  SHORT TERM GOALS: Target date: 1/10  ROM 0-90 without pain Baseline:see obj Goal status: INITIAL  2.  Able to demo heel strike with knee ext in ambulation Baseline: maintains flexion at eval Goal status: INITIAL    LONG TERM GOALS: Target date: POC date  Ambulate household and community distances pain <=2/10 Baseline:  Goal status: INITIAL  2.  Navigate stairs step over step Baseline:  Goal status: INITIAL  3.  ROM 0-120 Baseline:  Goal status: INITIAL  4.  Meet FOTO goal Baseline:  Goal status: INITIAL  5.  Quad strength 85% of opposite LE Baseline:  Goal status: INITIAL  6.  Independent in long term HEP Baseline:  Goal status: INITIAL   PLAN:  PT FREQUENCY: 1-2x/week  PT DURATION: POC date  PLANNED INTERVENTIONS: 97164- PT Re-evaluation, 97110-Therapeutic exercises, 97530- Therapeutic activity, 97112- Neuromuscular re-education, 97535- Self Care, 95638- Manual therapy, [phone]- Gait training, [phone]- Aquatic Therapy, Patient/Family education, Balance training, Stair training, Taping, Dry Needling, Joint mobilization, Spinal mobilization, Scar mobilization, Cryotherapy, and Moist heat.  PLAN FOR NEXT SESSION: ROM, gross quad activation  Sophronia Varney C. Dalyah Pla PT, DPT 08/31/23 3:07 PM

## 2023-09-06 ENCOUNTER — Ambulatory Visit (HOSPITAL_BASED_OUTPATIENT_CLINIC_OR_DEPARTMENT_OTHER): Payer: Medicare Other | Admitting: Physical Therapy

## 2023-09-06 ENCOUNTER — Encounter (HOSPITAL_BASED_OUTPATIENT_CLINIC_OR_DEPARTMENT_OTHER): Payer: Self-pay | Admitting: Physical Therapy

## 2023-09-06 DIAGNOSIS — G8929 Other chronic pain: Secondary | ICD-10-CM

## 2023-09-06 DIAGNOSIS — M25662 Stiffness of left knee, not elsewhere classified: Secondary | ICD-10-CM

## 2023-09-06 DIAGNOSIS — M25562 Pain in left knee: Secondary | ICD-10-CM | POA: Diagnosis not present

## 2023-09-06 DIAGNOSIS — R2689 Other abnormalities of gait and mobility: Secondary | ICD-10-CM

## 2023-09-06 NOTE — Therapy (Signed)
OUTPATIENT PHYSICAL THERAPY EVALUATION   Patient Name: Troy [name] MRN: [id number] DOB:04-May-1946, 77 y.o., male Today's Date: 09/06/2023  END OF SESSION:  PT End of Session - 09/06/23 1252     Visit Number 2    Number of Visits 17    Date for PT Re-Evaluation 10/26/22    Authorization Type BCBS MCR    PT Start Time 1100    PT Stop Time 1143    PT Time Calculation (min) 43 min    Activity Tolerance Patient tolerated treatment well    Behavior During Therapy WFL for tasks assessed/performed              Past Medical History:  Diagnosis Date   Abdominal distention    hernia   Anemia    Arthritis    big toes    Cancer (HCC)    prostate    Chronic kidney disease    Hypertension    Lumbar herniated disc    L5   Pneumonia    Rash    yeast infection from bladder sling surgery    Sciatica of right side 09/01/2011   right leg-tx. Tylenol   Sleep apnea    cpap   Past Surgical History:  Procedure Laterality Date   HERNIA REPAIR  September 01, 2010   INCONTINENCE SURGERY  Jan 31, 2011   INSERTION OF MESH  03/10/2015   Procedure: INSERTION OF MESH;  Surgeon: Luretha Murphy, MD;  Location: [facility];  Service: General;;   IR RADIOLOGIST EVAL & MGMT  05/15/2018   IR RADIOLOGIST EVAL & MGMT  07/13/2022   LAPAROSCOPIC NISSEN FUNDOPLICATION N/A 03/10/2015   Procedure: LAPAROSCOPIC NISSEN FUNDOPLICATION AND HIATAL HERNIA REPAIR;  Surgeon: Luretha Murphy, MD;  Location: [facility];  Service: General;  Laterality: N/A;   LAPAROTOMY N/A 05/29/2022   Procedure: laparoscopic assisted small bowel resection with anastamosis;  Surgeon: Sheliah Hatch, De Blanch, MD;  Location: [facility];  Service: General;  Laterality: N/A;   PROSTATECTOMY  reb 9, 2010   RADIOLOGY WITH ANESTHESIA N/A 08/30/2022   Procedure: RENAL CRYOABLATION;  Surgeon: Simonne Come, MD;  Location: [facility];  Service: Anesthesiology;  Laterality: N/A;   TONSILLECTOMY     TRANSURETHRAL RESECTION OF BLADDER TUMOR N/A 06/15/2022    Procedure: TRANSURETHRAL RESECTION OF BLADDER TUMOR (TURBT)/ CYSTOSCOPY/ BILATERAL RETROGRADE/;  Surgeon: Heloise Purpura, MD;  Location: [facility];  Service: Urology;  Laterality: N/A;  GENERAL ANESTHESIA WITH PARALYSIS   UPPER GI ENDOSCOPY N/A 03/10/2015   Procedure: UPPER GI ENDOSCOPY;  Surgeon: Luretha Murphy, MD;  Location: [facility];  Service: General;  Laterality: N/A;   VENTRAL HERNIA REPAIR  09/06/2011   Procedure: HERNIA REPAIR VENTRAL ADULT;  Surgeon: Valarie Merino, MD;  Location: [facility];  Service: General;  Laterality: N/A;   VENTRAL HERNIA REPAIR  09/06/2011   Procedure: LAPAROSCOPIC VENTRAL HERNIA;  Surgeon: Valarie Merino, MD;  Location: [facility];  Service: General;  Laterality: N/A;   Patient Active Problem List   Diagnosis Date Noted   Renal cell carcinoma of right kidney (HCC) 08/30/2022   SBO (small bowel obstruction) (HCC) 05/24/2022   Bladder mass 05/24/2022   Renal mass 05/24/2022   Anxiety 05/24/2022   HTN (hypertension) 05/24/2022   HLD (hyperlipidemia) 05/24/2022   Primary osteoarthritis of left shoulder 05/05/2020   Diverticulitis 05/03/2018   Status post laparoscopic Nissen fundoplication and repair of large hiatus hernia June 2016 03/10/2015   Ventral hernia-s/p repair x 2 09/07/2011   History of  prostate cancer-s/p prostatectomy 09/07/2011     REFERRING PROVIDER:  Ollen Gross, MD    REFERRING DIAG: [phone] (ICD-10-CM) - Presence of left artificial knee joint   S/p Lt TKA  Rationale for Evaluation and Treatment: Rehabilitation  THERAPY DIAG:  Chronic pain of left knee  Other abnormalities of gait and mobility  Stiffness of left knee, not elsewhere classified  ONSET DATE: DOS 12/17   SUBJECTIVE:                                                                                                                                                                                           SUBJECTIVE STATEMENT: Reports the pain has been fairly constant.   He is having difficulty at night.  He reports his causing minor depression.  Patient advised to contact MD if depression becomes worse.  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 Yes: NPRS scale: 6 Pain location: Lt knee Pain description: sore Aggravating factors: standing Relieving factors: meds  PRECAUTIONS:  None  RED FLAGS: None   WEIGHT BEARING RESTRICTIONS:  No  FALLS:  Has patient fallen in last 6 months? No  LIVING ENVIRONMENT: 2 steps going into the house   OCCUPATION:  Works three days a week. 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  PATIENT SURVEYS:  FOTO 30  COGNITIVE STATUS: Within functional limits for tasks assessed   SENSATION: WFL  EDEMA:  Yes: significant vs Rt   GAIT: Eval: amb with RW, antalgic gait.    Body Part #1 Knee  LOWER EXTREMITY ROM:     Passive  Left eval 12/26  Knee flexion 90 95  Knee extension -4 -3   (Blank rows = not tested)   Eval: minimal quad activation noted  TREATMENT DATE:   12/26  Manual: PROM into flexion and extension. TFM to posterior knee    Supine:  Quad set 2x15  SAQ 3x10  SLR 3x5   CT: LAQ 3 x 10  Standing: Heel rasie 3x10     Treatment                            eval 12/20:  Quad set Heel slide Seated hamstring stretch Standing lateral weight shift, elevated leg edema mob Recumb bike rocking    PATIENT EDUCATION:  Education details: Teacher, music of condition, POC, HEP, exercise form/rationale Person educated: Patient Education method: Explanation, Demonstration, Tactile cues, Verbal cues, and  Handouts Education comprehension: verbalized understanding, returned demonstration, verbal cues required, tactile cues required, and needs further education  HOME EXERCISE PROGRAM:  [email or web address]  Access Code: [email or web address]   ASSESSMENT:  CLINICAL IMPRESSION: Patient tolerated treatment well.  His range of motion is progressing.  His total arc was measured at 4 to 95 degrees.  He has some difficulty with straight leg raise today.  He is given 3 sets of 5 to work on at home.  Therapy given updated HEP.  We also worked on standing weight shifting.  Patient will benefit from further skilled therapy to continue to progress ability to ambulate perform functional activities.  Eval: Patient is a 77 y.o. M who was seen today for physical therapy evaluation and treatment for s/p Lt TKA. Pt reports right knee is also popping frequently at this point. Has not been using ice as he does nto feel it penetrates the bandage. C/o severe pain when moving into a standing position and accepting weight.      REHAB POTENTIAL: Good  CLINICAL DECISION MAKING: Stable/uncomplicated  EVALUATION COMPLEXITY: Low   GOALS: Goals reviewed with patient? Yes  SHORT TERM GOALS: Target date: 1/10  ROM 0-90 without pain Baseline:see obj Goal status: INITIAL  2.  Able to demo heel strike with knee ext in ambulation Baseline: maintains flexion at eval Goal status: INITIAL    LONG TERM GOALS: Target date: POC date  Ambulate household and community distances pain <=2/10 Baseline:  Goal status: INITIAL  2.  Navigate stairs step over step Baseline:  Goal status: INITIAL  3.  ROM 0-120 Baseline:  Goal status: INITIAL  4.  Meet FOTO goal Baseline:  Goal status: INITIAL  5.  Quad strength 85% of opposite LE Baseline:  Goal status: INITIAL  6.  Independent in long term HEP Baseline:  Goal status: INITIAL   PLAN:  PT FREQUENCY: 1-2x/week  PT DURATION: POC date  PLANNED INTERVENTIONS:  97164- PT Re-evaluation, 97110-Therapeutic exercises, 97530- Therapeutic activity, 97112- Neuromuscular re-education, 97535- Self Care, 56387- Manual therapy, [phone]- Gait training, [phone]- Aquatic Therapy, Patient/Family education, Balance training, Stair training, Taping, Dry Needling, Joint mobilization, Spinal mobilization, Scar mobilization, Cryotherapy, and Moist heat.  PLAN FOR NEXT SESSION: ROM, gross quad activation  Jessica C. Hightower PT, DPT 09/06/23 1:07 PM

## 2023-09-11 ENCOUNTER — Ambulatory Visit (HOSPITAL_BASED_OUTPATIENT_CLINIC_OR_DEPARTMENT_OTHER): Payer: Medicare Other | Admitting: Physical Therapy

## 2023-09-14 ENCOUNTER — Encounter (HOSPITAL_BASED_OUTPATIENT_CLINIC_OR_DEPARTMENT_OTHER): Payer: Self-pay

## 2023-09-14 ENCOUNTER — Ambulatory Visit (HOSPITAL_BASED_OUTPATIENT_CLINIC_OR_DEPARTMENT_OTHER): Payer: Medicare Other | Attending: Physician Assistant

## 2023-09-14 DIAGNOSIS — M25562 Pain in left knee: Secondary | ICD-10-CM | POA: Diagnosis present

## 2023-09-14 DIAGNOSIS — M25662 Stiffness of left knee, not elsewhere classified: Secondary | ICD-10-CM | POA: Diagnosis present

## 2023-09-14 DIAGNOSIS — R2689 Other abnormalities of gait and mobility: Secondary | ICD-10-CM | POA: Insufficient documentation

## 2023-09-14 DIAGNOSIS — G8929 Other chronic pain: Secondary | ICD-10-CM | POA: Diagnosis present

## 2023-09-14 NOTE — Therapy (Signed)
 OUTPATIENT PHYSICAL THERAPY TREATMENT   Patient Name: Troy [name] MRN: [id number] DOB:[birth year], 78 y.o., male Today's Date: 09/14/2023  END OF SESSION:  PT End of Session - 09/14/23 0940     Visit Number 3    Number of Visits 17    Date for PT Re-Evaluation 10/26/22    Authorization Type BCBS MCR    Progress Note Due on Visit 10    PT Start Time 0933    PT Stop Time 1015    PT Time Calculation (min) 42 min    Activity Tolerance Patient tolerated treatment well    Behavior During Therapy [facility] for tasks assessed/performed               Past Medical History:  Diagnosis Date   Abdominal distention    hernia   Anemia    Arthritis    big toes    Cancer (HCC)    prostate    Chronic kidney disease    Hypertension    Lumbar herniated disc    L5   Pneumonia    Rash    yeast infection from bladder sling surgery    Sciatica of right side 09/01/2011   right leg-tx. Tylenol    Sleep apnea    cpap   Past Surgical History:  Procedure Laterality Date   HERNIA REPAIR  September 01, 2010   INCONTINENCE SURGERY  Jan 31, 2011   INSERTION OF MESH  03/10/2015   Procedure: INSERTION OF MESH;  Surgeon: Donnice Lunger, MD;  Location: [facility];  Service: General;;   IR RADIOLOGIST EVAL & MGMT  05/15/2018   IR RADIOLOGIST EVAL & MGMT  07/13/2022   LAPAROSCOPIC NISSEN FUNDOPLICATION N/A 03/10/2015   Procedure: LAPAROSCOPIC NISSEN FUNDOPLICATION AND HIATAL HERNIA REPAIR;  Surgeon: Donnice Lunger, MD;  Location: [facility];  Service: General;  Laterality: N/A;   LAPAROTOMY N/A 05/29/2022   Procedure: laparoscopic assisted small bowel resection with anastamosis;  Surgeon: Stevie, Herlene Righter, MD;  Location: [facility];  Service: General;  Laterality: N/A;   PROSTATECTOMY  reb 9, 2010   RADIOLOGY WITH ANESTHESIA N/A 08/30/2022   Procedure: RENAL CRYOABLATION;  Surgeon: Adele Rush, MD;  Location: [facility];  Service: Anesthesiology;  Laterality: N/A;   TONSILLECTOMY     TRANSURETHRAL RESECTION OF  BLADDER TUMOR N/A 06/15/2022   Procedure: TRANSURETHRAL RESECTION OF BLADDER TUMOR (TURBT)/ CYSTOSCOPY/ BILATERAL RETROGRADE/;  Surgeon: Renda Glance, MD;  Location: [facility];  Service: Urology;  Laterality: N/A;  GENERAL ANESTHESIA WITH PARALYSIS   UPPER GI ENDOSCOPY N/A 03/10/2015   Procedure: UPPER GI ENDOSCOPY;  Surgeon: Donnice Lunger, MD;  Location: [facility];  Service: General;  Laterality: N/A;   VENTRAL HERNIA REPAIR  09/06/2011   Procedure: HERNIA REPAIR VENTRAL ADULT;  Surgeon: Donnice KATHEE Lunger, MD;  Location: [facility];  Service: General;  Laterality: N/A;   VENTRAL HERNIA REPAIR  09/06/2011   Procedure: LAPAROSCOPIC VENTRAL HERNIA;  Surgeon: Donnice KATHEE Lunger, MD;  Location: [facility];  Service: General;  Laterality: N/A;   Patient Active Problem List   Diagnosis Date Noted   Renal cell carcinoma of right kidney (HCC) 08/30/2022   SBO (small bowel obstruction) (HCC) 05/24/2022   Bladder mass 05/24/2022   Renal mass 05/24/2022   Anxiety 05/24/2022   HTN (hypertension) 05/24/2022   HLD (hyperlipidemia) 05/24/2022   Primary osteoarthritis of left shoulder 05/05/2020   Diverticulitis 05/03/2018   Status post laparoscopic Nissen fundoplication and repair of large hiatus hernia June 2016 03/10/2015  Ventral hernia-s/p repair x 2 09/07/2011   History of prostate cancer-s/p prostatectomy 09/07/2011     REFERRING PROVIDER:  Melodi Lerner, MD    REFERRING DIAG: [phone] (ICD-10-CM) - Presence of left artificial knee joint   S/p Lt TKA  Rationale for Evaluation and Treatment: Rehabilitation  THERAPY DIAG:  Chronic pain of left knee  Other abnormalities of gait and mobility  Stiffness of left knee, not elsewhere classified  ONSET DATE: DOS 12/17   SUBJECTIVE:                                                                                                                                                                                           SUBJECTIVE STATEMENT: Reports  ongoing difficulty sleeping. Is hoping to return to work by the end of the month.   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 Yes: NPRS scale: 6 Pain location: Lt knee Pain description: sore Aggravating factors: standing Relieving factors: meds  PRECAUTIONS:  None  RED FLAGS: None   WEIGHT BEARING RESTRICTIONS:  No  FALLS:  Has patient fallen in last 6 months? No  LIVING ENVIRONMENT: 2 steps going into the house   OCCUPATION:  Works three days a week. 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  PATIENT SURVEYS:  FOTO 30  COGNITIVE STATUS: Within functional limits for tasks assessed   SENSATION: WFL  EDEMA:  Yes: significant vs Rt   GAIT: Eval: amb with RW, antalgic gait.    Body Part #1 Knee  LOWER EXTREMITY ROM:     Passive  Left eval 12/26  Knee flexion 90 95  Knee extension -4 -3   (Blank rows = not tested)   Eval: minimal quad activation noted  TREATMENT DATE:   09/14/23:  -measured 3-112 -Recumbent bike full revs L 3 x4 min -PROM for knee flexion and extension IASTM to quads with roller -heel prop with over pressure -SLR 2x10 -LAQ 5 2x10 -Long sit HSS 30sec x3 -Sit to stands from elevated plinth x5 (challenging)     12/26  Manual: PROM into flexion and extension. TFM to posterior knee    Supine:  Quad set 2x15  SAQ 3x10  SLR 3x5   CT: LAQ 3 x 10  Standing: Heel rasie 3x10     Treatment                            eval 12/20:  Quad set Heel slide Seated hamstring stretch Standing lateral weight shift, elevated leg edema mob Recumb  bike rocking    PATIENT EDUCATION:  Education details: Teacher, Music of condition, POC, HEP, exercise form/rationale Person educated: Patient Education method: Explanation, Demonstration, Tactile cues, Verbal cues, and Handouts Education comprehension: verbalized understanding, returned demonstration, verbal cues required, tactile cues required, and needs further education  HOME EXERCISE PROGRAM:  [email or web address]  Access Code: [email or web address]   ASSESSMENT:  CLINICAL IMPRESSION: Incision area is healing well with expected swelling present. Pt demonstrates improvement in knee ROM from last session. He was measured at 3-112 AROM L knee. Pt with good tolerance for progressions to therex, though he was challenged by sit to stands. Significant tightness noted in L HS with long sit HSS. Pt educated about proper ice and elevation.   Eval: Patient is a 78 y.o. M who was seen today for physical therapy evaluation and treatment for s/p Lt TKA. Pt reports right knee is also popping frequently at this point. Has not been using ice as he does nto feel it penetrates the bandage. C/o severe pain when moving into a standing position and accepting weight.      REHAB POTENTIAL: Good  CLINICAL DECISION MAKING: Stable/uncomplicated  EVALUATION COMPLEXITY: Low   GOALS: Goals reviewed with patient? Yes  SHORT TERM GOALS: Target date: 1/10  ROM 0-90 without pain Baseline:see obj Goal status: INITIAL  2.  Able to demo heel strike with knee ext in ambulation Baseline: maintains flexion at eval Goal status: INITIAL    LONG TERM GOALS: Target date: POC date  Ambulate household and community distances pain <=2/10 Baseline:  Goal status: INITIAL  2.  Navigate stairs step over step Baseline:  Goal status: INITIAL  3.  ROM 0-120 Baseline:  Goal status: INITIAL  4.  Meet FOTO goal Baseline:  Goal status: INITIAL  5.  Quad strength 85% of opposite LE Baseline:  Goal status: INITIAL  6.   Independent in long term HEP Baseline:  Goal status: INITIAL   PLAN:  PT FREQUENCY: 1-2x/week  PT DURATION: POC date  PLANNED INTERVENTIONS: 97164- PT Re-evaluation, 97110-Therapeutic exercises, 97530- Therapeutic activity, 97112- Neuromuscular re-education, 97535- Self Care, 02859- Manual therapy, [phone]- Gait training, [phone]- Aquatic Therapy, Patient/Family education, Balance training, Stair training, Taping, Dry Needling, Joint mobilization, Spinal mobilization, Scar mobilization, Cryotherapy, and Moist heat.  PLAN FOR NEXT SESSION: ROM, gross quad activation  Asberry Rodes, PTA  09/14/23 11:35 AM

## 2023-09-18 ENCOUNTER — Encounter (HOSPITAL_BASED_OUTPATIENT_CLINIC_OR_DEPARTMENT_OTHER): Payer: Medicare Other | Admitting: Physical Therapy

## 2023-09-19 ENCOUNTER — Ambulatory Visit (HOSPITAL_BASED_OUTPATIENT_CLINIC_OR_DEPARTMENT_OTHER): Payer: Medicare Other | Admitting: Physical Therapy

## 2023-09-19 ENCOUNTER — Encounter (HOSPITAL_BASED_OUTPATIENT_CLINIC_OR_DEPARTMENT_OTHER): Payer: Self-pay | Admitting: Physical Therapy

## 2023-09-19 ENCOUNTER — Encounter (HOSPITAL_BASED_OUTPATIENT_CLINIC_OR_DEPARTMENT_OTHER): Payer: Medicare Other | Admitting: Physical Therapy

## 2023-09-19 DIAGNOSIS — R2689 Other abnormalities of gait and mobility: Secondary | ICD-10-CM

## 2023-09-19 DIAGNOSIS — M25562 Pain in left knee: Secondary | ICD-10-CM | POA: Diagnosis not present

## 2023-09-19 DIAGNOSIS — G8929 Other chronic pain: Secondary | ICD-10-CM

## 2023-09-19 DIAGNOSIS — M25662 Stiffness of left knee, not elsewhere classified: Secondary | ICD-10-CM

## 2023-09-19 NOTE — Therapy (Signed)
 OUTPATIENT PHYSICAL THERAPY TREATMENT   Patient Name: Troy [name] MRN: [id number] DOB:1945-10-03, 78 y.o., male Today's Date: 09/19/2023  END OF SESSION:  PT End of Session - 09/19/23 1053     Visit Number 4    Number of Visits 17    Date for PT Re-Evaluation 10/26/22    Authorization Type [employer] MCR    PT Start Time 0930    PT Stop Time 1013    PT Time Calculation (min) 43 min    Activity Tolerance Patient tolerated treatment well    Behavior During Therapy WFL for tasks assessed/performed                Past Medical History:  Diagnosis Date   Abdominal distention    hernia   Anemia    Arthritis    big toes    Cancer (HCC)    prostate    Chronic kidney disease    Hypertension    Lumbar herniated disc    L5   Pneumonia    Rash    yeast infection from bladder sling surgery    Sciatica of right side 09/01/2011   right leg-tx. Tylenol    Sleep apnea    cpap   Past Surgical History:  Procedure Laterality Date   HERNIA REPAIR  September 01, 2010   INCONTINENCE SURGERY  Jan 31, 2011   INSERTION OF MESH  03/10/2015   Procedure: INSERTION OF MESH;  Surgeon: Donnice Lunger, MD;  Location: [facility];  Service: General;;   IR RADIOLOGIST EVAL & MGMT  05/15/2018   IR RADIOLOGIST EVAL & MGMT  07/13/2022   LAPAROSCOPIC NISSEN FUNDOPLICATION N/A 03/10/2015   Procedure: LAPAROSCOPIC NISSEN FUNDOPLICATION AND HIATAL HERNIA REPAIR;  Surgeon: Donnice Lunger, MD;  Location: [facility];  Service: General;  Laterality: N/A;   LAPAROTOMY N/A 05/29/2022   Procedure: laparoscopic assisted small bowel resection with anastamosis;  Surgeon: Stevie, Herlene Righter, MD;  Location: [facility];  Service: General;  Laterality: N/A;   PROSTATECTOMY  reb 9, 2010   RADIOLOGY WITH ANESTHESIA N/A 08/30/2022   Procedure: RENAL CRYOABLATION;  Surgeon: Adele Rush, MD;  Location: [facility];  Service: Anesthesiology;  Laterality: N/A;   TONSILLECTOMY     TRANSURETHRAL RESECTION OF BLADDER TUMOR N/A 06/15/2022    Procedure: TRANSURETHRAL RESECTION OF BLADDER TUMOR (TURBT)/ CYSTOSCOPY/ BILATERAL RETROGRADE/;  Surgeon: Renda Glance, MD;  Location: [facility];  Service: Urology;  Laterality: N/A;  GENERAL ANESTHESIA WITH PARALYSIS   UPPER GI ENDOSCOPY N/A 03/10/2015   Procedure: UPPER GI ENDOSCOPY;  Surgeon: Donnice Lunger, MD;  Location: [facility];  Service: General;  Laterality: N/A;   VENTRAL HERNIA REPAIR  09/06/2011   Procedure: HERNIA REPAIR VENTRAL ADULT;  Surgeon: Donnice KATHEE Lunger, MD;  Location: [facility];  Service: General;  Laterality: N/A;   VENTRAL HERNIA REPAIR  09/06/2011   Procedure: LAPAROSCOPIC VENTRAL HERNIA;  Surgeon: Donnice KATHEE Lunger, MD;  Location: [facility];  Service: General;  Laterality: N/A;   Patient Active Problem List   Diagnosis Date Noted   Renal cell carcinoma of right kidney (HCC) 08/30/2022   SBO (small bowel obstruction) (HCC) 05/24/2022   Bladder mass 05/24/2022   Renal mass 05/24/2022   Anxiety 05/24/2022   HTN (hypertension) 05/24/2022   HLD (hyperlipidemia) 05/24/2022   Primary osteoarthritis of left shoulder 05/05/2020   Diverticulitis 05/03/2018   Status post laparoscopic Nissen fundoplication and repair of large hiatus hernia June 2016 03/10/2015   Ventral hernia-s/p repair x 2 09/07/2011  History of prostate cancer-s/p prostatectomy 09/07/2011     REFERRING PROVIDER:  Melodi Lerner, MD    REFERRING DIAG: [phone] (ICD-10-CM) - Presence of left artificial knee joint   S/p Lt TKA  Rationale for Evaluation and Treatment: Rehabilitation  THERAPY DIAG:  Chronic pain of left knee  Other abnormalities of gait and mobility  Stiffness of left knee, not elsewhere classified  ONSET DATE: DOS 12/17   SUBJECTIVE:                                                                                                                                                                                           SUBJECTIVE STATEMENT: The patient has been doing OK. He has a rash.  He showed therapy a picture. He was advised to contact MD. He also has an area. Of numbness. He was advised this is normal. He has otherwise been doing well.   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 Yes: NPRS scale: 6 Pain location: Lt knee Pain description: sore Aggravating factors: standing Relieving factors: meds  PRECAUTIONS:  None  RED FLAGS: None   WEIGHT BEARING RESTRICTIONS:  No  FALLS:  Has patient fallen in last 6 months? No  LIVING ENVIRONMENT: 2 steps going into the house   OCCUPATION:  Works three days a week. 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  PATIENT SURVEYS:  FOTO 30  COGNITIVE STATUS: Within functional limits for tasks assessed   SENSATION: WFL  EDEMA:  Yes: significant vs Rt   GAIT: Eval: amb with RW, antalgic gait.    Body Part #1 Knee  LOWER EXTREMITY ROM:     Passive  Left eval 12/26 1/8  Knee flexion 90 95 108  Knee extension -4 -3 -3   (Blank rows = not tested)   Eval: minimal quad activation noted  TREATMENT DATE:  1/8 Nu-step 5 min   Supine:  Quad set x15  SAQ 3x10  SLR 2x10  Seated:  LAQ 2x10   Standing:   Heel raises 2x10 Slow march 2x10   Manual: PROM into flexion and emphasis on extension; trigger poin release to hamstring    09/14/23:  -measured 3-112 -Recumbent bike full revs L 3 x4 min -PROM for knee flexion and extension IASTM to quads with roller -heel prop with over pressure -SLR 2x10 -LAQ 5 2x10 -Long sit HSS 30sec x3 -Sit to stands from elevated plinth x5 (challenging)     12/26  Manual:  PROM into flexion and extension. TFM to posterior knee    Supine:  Quad set 2x15  SAQ 3x10  SLR 3x5   CT: LAQ 3 x 10  Standing: Heel rasie 3x10     Treatment                            eval 12/20:  Quad set Heel slide Seated hamstring stretch Standing lateral weight shift, elevated leg edema mob Recumb bike rocking    PATIENT EDUCATION:  Education details: Teacher, Music of condition, POC, HEP, exercise form/rationale Person educated: Patient Education method: Explanation, Demonstration, Tactile cues, Verbal cues, and Handouts Education comprehension: verbalized understanding, returned demonstration, verbal cues required, tactile cues required, and needs further education  HOME EXERCISE PROGRAM:  [email or web address]  Access Code: [email or web address]   ASSESSMENT:  CLINICAL IMPRESSION: The patient continues to make great progress. We focused on extenson stretching today. He was 3 degrees from straight towards the end.. We also added in standing slow march for his HEP. We will continue to progress as tolerated.   Eval: Patient is a 78 y.o. M who was seen today for physical therapy evaluation and treatment for s/p Lt TKA. Pt reports right knee is also popping frequently at this point. Has not been using ice as he does nto feel it penetrates the bandage. C/o severe pain when moving into a standing position and accepting weight.      REHAB POTENTIAL: Good  CLINICAL DECISION MAKING: Stable/uncomplicated  EVALUATION COMPLEXITY: Low   GOALS: Goals reviewed with patient? Yes  SHORT TERM GOALS: Target date: 1/10  ROM 0-90 without pain Baseline:see obj Goal status: INITIAL  2.  Able to demo heel strike with knee ext in ambulation Baseline: maintains flexion at eval Goal status: INITIAL    LONG TERM GOALS: Target date: POC date  Ambulate household and community distances pain <=2/10 Baseline:  Goal status: INITIAL  2.  Navigate stairs step over step Baseline:  Goal  status: INITIAL  3.  ROM 0-120 Baseline:  Goal status: INITIAL  4.  Meet FOTO goal Baseline:  Goal status: INITIAL  5.  Quad strength 85% of opposite LE Baseline:  Goal status: INITIAL  6.  Independent in long term HEP Baseline:  Goal status: INITIAL   PLAN:  PT FREQUENCY: 1-2x/week  PT DURATION: POC date  PLANNED INTERVENTIONS: 97164- PT Re-evaluation, 97110-Therapeutic exercises, 97530- Therapeutic activity, 97112- Neuromuscular re-education, 97535- Self Care, 02859- Manual therapy, [phone]- Gait training, [phone]- Aquatic Therapy, Patient/Family education, Balance training, Stair training, Taping, Dry Needling, Joint mobilization, Spinal mobilization, Scar mobilization, Cryotherapy, and Moist heat.  PLAN FOR NEXT SESSION: ROM, gross quad activation  Asberry Rodes, PTA  09/19/23 3:06 PM

## 2023-09-21 ENCOUNTER — Ambulatory Visit (HOSPITAL_BASED_OUTPATIENT_CLINIC_OR_DEPARTMENT_OTHER): Payer: Medicare Other

## 2023-09-21 ENCOUNTER — Encounter (HOSPITAL_BASED_OUTPATIENT_CLINIC_OR_DEPARTMENT_OTHER): Payer: Self-pay

## 2023-09-21 DIAGNOSIS — M25562 Pain in left knee: Secondary | ICD-10-CM | POA: Diagnosis not present

## 2023-09-21 DIAGNOSIS — G8929 Other chronic pain: Secondary | ICD-10-CM

## 2023-09-21 DIAGNOSIS — M25662 Stiffness of left knee, not elsewhere classified: Secondary | ICD-10-CM

## 2023-09-21 DIAGNOSIS — R2689 Other abnormalities of gait and mobility: Secondary | ICD-10-CM

## 2023-09-21 NOTE — Therapy (Signed)
 OUTPATIENT PHYSICAL THERAPY TREATMENT   Patient Name: Troy [name] MRN: [id number] DOB:1945-10-10, 78 y.o., male Today's Date: 09/21/2023  END OF SESSION:  PT End of Session - 09/21/23 0946     Visit Number 5    Number of Visits 17    Date for PT Re-Evaluation 10/26/22    Authorization Type [employer]BS MCR    Progress Note Due on Visit 10    PT Start Time 0937    PT Stop Time 1018    PT Time Calculation (min) 41 min    Activity Tolerance Patient tolerated treatment well    Behavior During Therapy [facility] for tasks assessed/performed                 Past Medical History:  Diagnosis Date   Abdominal distention    hernia   Anemia    Arthritis    big toes    Cancer (HCC)    prostate    Chronic kidney disease    Hypertension    Lumbar herniated disc    L5   Pneumonia    Rash    yeast infection from bladder sling surgery    Sciatica of right side 09/01/2011   right leg-tx. Tylenol    Sleep apnea    cpap   Past Surgical History:  Procedure Laterality Date   HERNIA REPAIR  September 01, 2010   INCONTINENCE SURGERY  Jan 31, 2011   INSERTION OF MESH  03/10/2015   Procedure: INSERTION OF MESH;  Surgeon: Donnice Lunger, MD;  Location: [facility];  Service: General;;   IR RADIOLOGIST EVAL & MGMT  05/15/2018   IR RADIOLOGIST EVAL & MGMT  07/13/2022   LAPAROSCOPIC NISSEN FUNDOPLICATION N/A 03/10/2015   Procedure: LAPAROSCOPIC NISSEN FUNDOPLICATION AND HIATAL HERNIA REPAIR;  Surgeon: Donnice Lunger, MD;  Location: [facility];  Service: General;  Laterality: N/A;   LAPAROTOMY N/A 05/29/2022   Procedure: laparoscopic assisted small bowel resection with anastamosis;  Surgeon: Stevie, Herlene Righter, MD;  Location: [facility];  Service: General;  Laterality: N/A;   PROSTATECTOMY  reb 9, 2010   RADIOLOGY WITH ANESTHESIA N/A 08/30/2022   Procedure: RENAL CRYOABLATION;  Surgeon: Adele Rush, MD;  Location: [facility];  Service: Anesthesiology;  Laterality: N/A;   TONSILLECTOMY     TRANSURETHRAL RESECTION  OF BLADDER TUMOR N/A 06/15/2022   Procedure: TRANSURETHRAL RESECTION OF BLADDER TUMOR (TURBT)/ CYSTOSCOPY/ BILATERAL RETROGRADE/;  Surgeon: Renda Glance, MD;  Location: [facility];  Service: Urology;  Laterality: N/A;  GENERAL ANESTHESIA WITH PARALYSIS   UPPER GI ENDOSCOPY N/A 03/10/2015   Procedure: UPPER GI ENDOSCOPY;  Surgeon: Donnice Lunger, MD;  Location: [facility];  Service: General;  Laterality: N/A;   VENTRAL HERNIA REPAIR  09/06/2011   Procedure: HERNIA REPAIR VENTRAL ADULT;  Surgeon: Donnice KATHEE Lunger, MD;  Location: [facility];  Service: General;  Laterality: N/A;   VENTRAL HERNIA REPAIR  09/06/2011   Procedure: LAPAROSCOPIC VENTRAL HERNIA;  Surgeon: Donnice KATHEE Lunger, MD;  Location: [facility];  Service: General;  Laterality: N/A;   Patient Active Problem List   Diagnosis Date Noted   Renal cell carcinoma of right kidney (HCC) 08/30/2022   SBO (small bowel obstruction) (HCC) 05/24/2022   Bladder mass 05/24/2022   Renal mass 05/24/2022   Anxiety 05/24/2022   HTN (hypertension) 05/24/2022   HLD (hyperlipidemia) 05/24/2022   Primary osteoarthritis of left shoulder 05/05/2020   Diverticulitis 05/03/2018   Status post laparoscopic Nissen fundoplication and repair of large hiatus hernia June 2016 03/10/2015  Ventral hernia-s/p repair x 2 09/07/2011   History of prostate cancer-s/p prostatectomy 09/07/2011     REFERRING PROVIDER:  Melodi Lerner, MD    REFERRING DIAG: [phone] (ICD-10-CM) - Presence of left artificial knee joint   S/p Lt TKA  Rationale for Evaluation and Treatment: Rehabilitation  THERAPY DIAG:  Chronic pain of left knee  Other abnormalities of gait and mobility  Stiffness of left knee, not elsewhere classified  ONSET DATE: DOS 12/17   SUBJECTIVE:                                                                                                                                                                                           SUBJECTIVE STATEMENT: Pt reports  he continues to have contact dermatitis. MD told him to try benadryl  or cortisone cream. Pt has been using calamine lotion with improvement. I went to sleep without pain last night for the first time since my surgery!SABRA  PERTINENT HISTORY:  Arthritis hips left knee and left shoulder; Herniated lumbar disc; Prostate cancer   PAIN:  Are you having pain? Yes: NPRS scale: 1/10 Pain location: Lt knee Pain description: sore Aggravating factors: standing Relieving factors: meds  PRECAUTIONS:  None  RED FLAGS: None   WEIGHT BEARING RESTRICTIONS:  No  FALLS:  Has patient fallen in last 6 months? No  LIVING ENVIRONMENT: 2 steps going into the house   OCCUPATION:  Works three days a week. 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  PATIENT SURVEYS:  FOTO 30  COGNITIVE STATUS: Within functional limits for tasks assessed   SENSATION: WFL  EDEMA:  Yes: significant vs Rt   GAIT: Eval: amb with RW, antalgic gait.    Body Part #1 Knee  LOWER EXTREMITY ROM:     Passive  Left eval 12/26 1/8 1/10  Knee flexion 90 95 108 115  Knee extension -4 -3 -3 -4   (Blank rows = not tested)   Eval: minimal quad activation noted  TREATMENT DATE:   1/10 Nu-step 4 min L4 PROM L knee flexion and extension Measured at -4-115deg flexion  Supine:  Quad set x15  SLR x5 (challenging today) LAQ 5 2x10   Standing:   Slow march 2x10  Gait without SPC down hall and back- cues for increased heel contact   1/8 Nu-step 5 min   Supine:  Quad set x15  SAQ 3x10  SLR 2x10  Seated:  LAQ 2x10   Standing:   Heel raises 2x10 Slow march 2x10   Manual: PROM into flexion and emphasis on  extension; trigger poin release to hamstring    09/14/23:  -measured 3-112 -Recumbent bike full revs L 3 x4 min -PROM for knee flexion and extension IASTM to quads with roller -heel prop with over pressure -SLR 2x10 -LAQ 5 2x10 -Long sit HSS 30sec x3 -Sit to stands from elevated plinth x5 (challenging)     12/26  Manual: PROM into flexion and extension. TFM to posterior knee    Supine:  Quad set 2x15  SAQ 3x10  SLR 3x5   CT: LAQ 3 x 10  Standing: Heel rasie 3x10     Treatment                            eval 12/20:  Quad set Heel slide Seated hamstring stretch Standing lateral weight shift, elevated leg edema mob Recumb bike rocking    PATIENT EDUCATION:  Education details: Teacher, Music of condition, POC, HEP, exercise form/rationale Person educated: Patient Education method: Explanation, Demonstration, Tactile cues, Verbal cues, and Handouts Education comprehension: verbalized understanding, returned demonstration, verbal cues required, tactile cues required, and needs further education  HOME EXERCISE PROGRAM:  [email or web address]  Access Code: [email or web address]   ASSESSMENT:  CLINICAL IMPRESSION: Pt remains stiff into end range flexion and extension, though nearing full ROM. He had increased difficulty with SLR today, so held reps with this. Worked on gait in hall without [facility] with cues for increased heel contact. He demonstrates safe gait pattern without incidence of buckling. Will continue to work on ROM and functional strength. Pt to monitor dermatitis.   Eval: Patient is a 78 y.o. M who was seen today for physical therapy evaluation and treatment for s/p Lt TKA. Pt reports right knee is also popping frequently at this point. Has not been using ice as he does nto feel it penetrates the bandage. C/o severe pain when moving into a standing position and accepting weight.      REHAB POTENTIAL: Good  CLINICAL DECISION MAKING: Stable/uncomplicated  EVALUATION  COMPLEXITY: Low   GOALS: Goals reviewed with patient? Yes  SHORT TERM GOALS: Target date: 1/10  ROM 0-90 without pain Baseline:see obj Goal status: INITIAL  2.  Able to demo heel strike with knee ext in ambulation Baseline: maintains flexion at eval Goal status: INITIAL    LONG TERM GOALS: Target date: POC date  Ambulate household and community distances pain <=2/10 Baseline:  Goal status: INITIAL  2.  Navigate stairs step over step Baseline:  Goal status: INITIAL  3.  ROM 0-120 Baseline:  Goal status: INITIAL  4.  Meet FOTO goal Baseline:  Goal status: INITIAL  5.  Quad strength 85% of opposite LE Baseline:  Goal status: INITIAL  6.  Independent in long term HEP Baseline:  Goal status: INITIAL   PLAN:  PT FREQUENCY: 1-2x/week  PT DURATION: POC date  PLANNED INTERVENTIONS: 97164- PT Re-evaluation, 97110-Therapeutic exercises, 97530- Therapeutic activity,  02887- Neuromuscular re-education, [phone]- Self Care, 02859- Manual therapy, [phone]- Gait training, [phone]- Aquatic Therapy, Patient/Family education, Balance training, Stair training, Taping, Dry Needling, Joint mobilization, Spinal mobilization, Scar mobilization, Cryotherapy, and Moist heat.  PLAN FOR NEXT SESSION: ROM, gross quad activation  Asberry Rodes, PTA  09/21/23 11:37 AM

## 2023-09-25 ENCOUNTER — Encounter (HOSPITAL_BASED_OUTPATIENT_CLINIC_OR_DEPARTMENT_OTHER): Payer: Medicare Other | Admitting: Physical Therapy

## 2023-09-26 ENCOUNTER — Encounter (HOSPITAL_BASED_OUTPATIENT_CLINIC_OR_DEPARTMENT_OTHER): Payer: Self-pay

## 2023-09-26 ENCOUNTER — Ambulatory Visit (HOSPITAL_BASED_OUTPATIENT_CLINIC_OR_DEPARTMENT_OTHER): Payer: Medicare Other

## 2023-09-26 DIAGNOSIS — G8929 Other chronic pain: Secondary | ICD-10-CM

## 2023-09-26 DIAGNOSIS — R2689 Other abnormalities of gait and mobility: Secondary | ICD-10-CM

## 2023-09-26 DIAGNOSIS — M25562 Pain in left knee: Secondary | ICD-10-CM | POA: Diagnosis not present

## 2023-09-26 DIAGNOSIS — M25662 Stiffness of left knee, not elsewhere classified: Secondary | ICD-10-CM

## 2023-09-26 NOTE — Therapy (Signed)
 OUTPATIENT PHYSICAL THERAPY TREATMENT   Patient Name: Troy [name] MRN: [id number] DOB:1945/10/24, 78 y.o., male Today's Date: 09/26/2023  END OF SESSION:  PT End of Session - 09/26/23 1025     Visit Number 6    Number of Visits 17    Date for PT Re-Evaluation 10/26/22    Authorization Type [employer] MCR    Progress Note Due on Visit 10    PT Start Time 1021    PT Stop Time 1103    PT Time Calculation (min) 42 min    Activity Tolerance Patient tolerated treatment well    Behavior During Therapy WFL for tasks assessed/performed                  Past Medical History:  Diagnosis Date   Abdominal distention    hernia   Anemia    Arthritis    big toes    Cancer (HCC)    prostate    Chronic kidney disease    Hypertension    Lumbar herniated disc    L5   Pneumonia    Rash    yeast infection from bladder sling surgery    Sciatica of right side 09/01/2011   right leg-tx. Tylenol    Sleep apnea    cpap   Past Surgical History:  Procedure Laterality Date   HERNIA REPAIR  September 01, 2010   INCONTINENCE SURGERY  Jan 31, 2011   INSERTION OF MESH  03/10/2015   Procedure: INSERTION OF MESH;  Surgeon: Jacolyn Matar, MD;  Location: [facility];  Service: General;;   IR RADIOLOGIST EVAL & MGMT  05/15/2018   IR RADIOLOGIST EVAL & MGMT  07/13/2022   LAPAROSCOPIC NISSEN FUNDOPLICATION N/A 03/10/2015   Procedure: LAPAROSCOPIC NISSEN FUNDOPLICATION AND HIATAL HERNIA REPAIR;  Surgeon: Jacolyn Matar, MD;  Location: [facility];  Service: General;  Laterality: N/A;   LAPAROTOMY N/A 05/29/2022   Procedure: laparoscopic assisted small bowel resection with anastamosis;  Surgeon: Dorrie Gaudier, Alphonso Aschoff, MD;  Location: [facility];  Service: General;  Laterality: N/A;   PROSTATECTOMY  reb 9, 2010   RADIOLOGY WITH ANESTHESIA N/A 08/30/2022   Procedure: RENAL CRYOABLATION;  Surgeon: Robbi Childs, MD;  Location: [facility];  Service: Anesthesiology;  Laterality: N/A;   TONSILLECTOMY     TRANSURETHRAL  RESECTION OF BLADDER TUMOR N/A 06/15/2022   Procedure: TRANSURETHRAL RESECTION OF BLADDER TUMOR (TURBT)/ CYSTOSCOPY/ BILATERAL RETROGRADE/;  Surgeon: Florencio Hunting, MD;  Location: [facility];  Service: Urology;  Laterality: N/A;  GENERAL ANESTHESIA WITH PARALYSIS   UPPER GI ENDOSCOPY N/A 03/10/2015   Procedure: UPPER GI ENDOSCOPY;  Surgeon: Jacolyn Matar, MD;  Location: [facility];  Service: General;  Laterality: N/A;   VENTRAL HERNIA REPAIR  09/06/2011   Procedure: HERNIA REPAIR VENTRAL ADULT;  Surgeon: Azucena Bollard, MD;  Location: [facility];  Service: General;  Laterality: N/A;   VENTRAL HERNIA REPAIR  09/06/2011   Procedure: LAPAROSCOPIC VENTRAL HERNIA;  Surgeon: Azucena Bollard, MD;  Location: [facility];  Service: General;  Laterality: N/A;   Patient Active Problem List   Diagnosis Date Noted   Renal cell carcinoma of right kidney (HCC) 08/30/2022   SBO (small bowel obstruction) (HCC) 05/24/2022   Bladder mass 05/24/2022   Renal mass 05/24/2022   Anxiety 05/24/2022   HTN (hypertension) 05/24/2022   HLD (hyperlipidemia) 05/24/2022   Primary osteoarthritis of left shoulder 05/05/2020   Diverticulitis 05/03/2018   Status post laparoscopic Nissen fundoplication and repair of large hiatus hernia June 2016  03/10/2015   Ventral hernia-s/p repair x 2 09/07/2011   History of prostate cancer-s/p prostatectomy 09/07/2011     REFERRING PROVIDER:  Liliane Rei, MD    REFERRING DIAG: [phone] (ICD-10-CM) - Presence of left artificial knee joint   S/p Lt TKA  Rationale for Evaluation and Treatment: Rehabilitation  THERAPY DIAG:  Chronic pain of left knee  Other abnormalities of gait and mobility  Stiffness of left knee, not elsewhere classified  ONSET DATE: DOS 12/17   SUBJECTIVE:                                                                                                                                                                                           SUBJECTIVE  STATEMENT: Pt reports his dermatitis is still there, but seems to be improving. He denies pain in knee at entry. Has bene able to increase duration of bone foam. Has been walking without cane. "I just feel like I have no stamina."  PERTINENT HISTORY:  Arthritis hips left knee and left shoulder; Herniated lumbar disc; Prostate cancer   PAIN:  Are you having pain? Yes: NPRS scale: 1/10 Pain location: Lt knee Pain description: sore Aggravating factors: standing Relieving factors: meds  PRECAUTIONS:  None  RED FLAGS: None   WEIGHT BEARING RESTRICTIONS:  No  FALLS:  Has patient fallen in last 6 months? No  LIVING ENVIRONMENT: 2 steps going into the house   OCCUPATION:  Works three days a week. 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  PATIENT SURVEYS:  FOTO 30 FOTO visit 6: 54% (Goal of 62%)  COGNITIVE STATUS: Within functional limits for tasks assessed   SENSATION: WFL  EDEMA:  Yes: significant vs Rt   GAIT: Eval: amb with RW, antalgic gait.    Body Part #1 Knee  LOWER EXTREMITY ROM:     Passive  Left eval 12/26 1/8 1/10  Knee flexion 90 95 108 115  Knee extension -4 -3 -3 -4   (Blank rows = not tested)   Eval: minimal quad activation noted  TREATMENT DATE:   1/15 FOTO:54%  PROM L knee   SLR 3x10 LAQ 5" 2x10 3# HSC RTB 2x10 HR/TR 2x15 Squats at rail 2x10 Marches with min UE support 2x10 Step ups 4" 2x10 HEP update    1/10 Nu-step 4 min L4 PROM L knee flexion and extension Measured at -4-115deg flexion  Supine:  Quad set x15  SLR x5 (challenging today) LAQ 5" 2x10   Standing:   Slow march 2x10  Gait without SPC down hall and back-  cues for increased heel contact     PATIENT EDUCATION:  Education details: Teacher, music of condition, POC, HEP, exercise form/rationale Person educated: Patient Education method: Explanation, Demonstration, Tactile cues, Verbal cues, and Handouts Education comprehension: verbalized understanding, returned demonstration, verbal cues required, tactile cues required, and needs further education  HOME EXERCISE PROGRAM:  [email or web address]  Access Code: [id number]   ASSESSMENT:  CLINICAL IMPRESSION: Pt challenged with balance during standing marches, requiring light UE support on railing. He was able to progress TherAct including 4" step ups and partial squats. Updated HEP to include progressions and take out initial exercises. Pt exhibits improving ROM and stability. Did note some R knee popping with exercises, though non painful. He will continue to benefit from additional PT to improve functional capacity, balance, and strength/endurance.   Eval: Patient is a 78 y.o. M who was seen today for physical therapy evaluation and treatment for s/p Lt TKA. Pt reports right knee is also popping frequently at this point. Has not been using ice as he does nto feel it penetrates the bandage. C/o severe pain when moving into a standing position and accepting weight.      REHAB POTENTIAL: Good  CLINICAL DECISION MAKING: Stable/uncomplicated  EVALUATION COMPLEXITY: Low   GOALS: Goals reviewed with patient? Yes  SHORT TERM GOALS: Target date: 1/10  ROM 0-90 without pain Baseline:see obj Goal status: INITIAL  2.  Able to demo heel strike with knee ext in ambulation Baseline: maintains flexion at eval Goal status: INITIAL    LONG TERM GOALS: Target date: POC date  Ambulate household and community distances pain <=2/10 Baseline:  Goal status: INITIAL  2.  Navigate stairs step over step Baseline:  Goal status: INITIAL  3.  ROM 0-120 Baseline:  Goal status: INITIAL  4.  Meet  FOTO goal Baseline:  Goal status: INITIAL  5.  Quad strength 85% of opposite LE Baseline:  Goal status: INITIAL  6.  Independent in long term HEP Baseline:  Goal status: INITIAL   PLAN:  PT FREQUENCY: 1-2x/week  PT DURATION: POC date  PLANNED INTERVENTIONS: 97164- PT Re-evaluation, 97110-Therapeutic exercises, 97530- Therapeutic activity, 97112- Neuromuscular re-education, 97535- Self Care, 14782- Manual therapy, [phone]- Gait training, [phone]- Aquatic Therapy, Patient/Family education, Balance training, Stair training, Taping, Dry Needling, Joint mobilization, Spinal mobilization, Scar mobilization, Cryotherapy, and Moist heat.  PLAN FOR NEXT SESSION: ROM, gross quad activation  Herb Loges, PTA  09/26/23 11:15 AM

## 2023-09-28 ENCOUNTER — Encounter (HOSPITAL_BASED_OUTPATIENT_CLINIC_OR_DEPARTMENT_OTHER): Payer: Self-pay

## 2023-09-28 ENCOUNTER — Ambulatory Visit (HOSPITAL_BASED_OUTPATIENT_CLINIC_OR_DEPARTMENT_OTHER): Payer: Medicare Other

## 2023-09-28 DIAGNOSIS — M25562 Pain in left knee: Secondary | ICD-10-CM | POA: Diagnosis not present

## 2023-09-28 DIAGNOSIS — G8929 Other chronic pain: Secondary | ICD-10-CM

## 2023-09-28 DIAGNOSIS — R2689 Other abnormalities of gait and mobility: Secondary | ICD-10-CM

## 2023-09-28 DIAGNOSIS — M25662 Stiffness of left knee, not elsewhere classified: Secondary | ICD-10-CM

## 2023-09-28 NOTE — Therapy (Signed)
OUTPATIENT PHYSICAL THERAPY TREATMENT   Patient Name: ATWELL RUSSELLO MRN: [id number] DOB:1945/10/04, 78 y.o., male Today's Date: 09/28/2023  END OF SESSION:  PT End of Session - 09/28/23 0942     Visit Number 7    Number of Visits 17    Date for PT Re-Evaluation 10/26/22    Authorization Type [employer]BS MCR    Progress Note Due on Visit 10    PT Start Time 0933    PT Stop Time 1015    PT Time Calculation (min) 42 min    Activity Tolerance Patient tolerated treatment well    Behavior During Therapy [facility] for tasks assessed/performed                   Past Medical History:  Diagnosis Date   Abdominal distention    hernia   Anemia    Arthritis    big toes    Cancer (HCC)    prostate    Chronic kidney disease    Hypertension    Lumbar herniated disc    L5   Pneumonia    Rash    yeast infection from bladder sling surgery    Sciatica of right side 09/01/2011   right leg-tx. Tylenol   Sleep apnea    cpap   Past Surgical History:  Procedure Laterality Date   HERNIA REPAIR  September 01, 2010   INCONTINENCE SURGERY  Jan 31, 2011   INSERTION OF MESH  03/10/2015   Procedure: INSERTION OF MESH;  Surgeon: Luretha Murphy, MD;  Location: [facility];  Service: General;;   IR RADIOLOGIST EVAL & MGMT  05/15/2018   IR RADIOLOGIST EVAL & MGMT  07/13/2022   LAPAROSCOPIC NISSEN FUNDOPLICATION N/A 03/10/2015   Procedure: LAPAROSCOPIC NISSEN FUNDOPLICATION AND HIATAL HERNIA REPAIR;  Surgeon: Luretha Murphy, MD;  Location: [facility];  Service: General;  Laterality: N/A;   LAPAROTOMY N/A 05/29/2022   Procedure: laparoscopic assisted small bowel resection with anastamosis;  Surgeon: Sheliah Hatch, De Blanch, MD;  Location: [facility];  Service: General;  Laterality: N/A;   PROSTATECTOMY  reb 9, 2010   RADIOLOGY WITH ANESTHESIA N/A 08/30/2022   Procedure: RENAL CRYOABLATION;  Surgeon: Simonne Come, MD;  Location: [facility];  Service: Anesthesiology;  Laterality: N/A;   TONSILLECTOMY     TRANSURETHRAL  RESECTION OF BLADDER TUMOR N/A 06/15/2022   Procedure: TRANSURETHRAL RESECTION OF BLADDER TUMOR (TURBT)/ CYSTOSCOPY/ BILATERAL RETROGRADE/;  Surgeon: Heloise Purpura, MD;  Location: [facility];  Service: Urology;  Laterality: N/A;  GENERAL ANESTHESIA WITH PARALYSIS   UPPER GI ENDOSCOPY N/A 03/10/2015   Procedure: UPPER GI ENDOSCOPY;  Surgeon: Luretha Murphy, MD;  Location: [facility];  Service: General;  Laterality: N/A;   VENTRAL HERNIA REPAIR  09/06/2011   Procedure: HERNIA REPAIR VENTRAL ADULT;  Surgeon: Valarie Merino, MD;  Location: [facility];  Service: General;  Laterality: N/A;   VENTRAL HERNIA REPAIR  09/06/2011   Procedure: LAPAROSCOPIC VENTRAL HERNIA;  Surgeon: Valarie Merino, MD;  Location: [facility];  Service: General;  Laterality: N/A;   Patient Active Problem List   Diagnosis Date Noted   Renal cell carcinoma of right kidney (HCC) 08/30/2022   SBO (small bowel obstruction) (HCC) 05/24/2022   Bladder mass 05/24/2022   Renal mass 05/24/2022   Anxiety 05/24/2022   HTN (hypertension) 05/24/2022   HLD (hyperlipidemia) 05/24/2022   Primary osteoarthritis of left shoulder 05/05/2020   Diverticulitis 05/03/2018   Status post laparoscopic Nissen fundoplication and repair of large hiatus hernia June  2016 03/10/2015   Ventral hernia-s/p repair x 2 09/07/2011   History of prostate cancer-s/p prostatectomy 09/07/2011     REFERRING PROVIDER:  Ollen Gross, MD    REFERRING DIAG: [phone] (ICD-10-CM) - Presence of left artificial knee joint   S/p Lt TKA  Rationale for Evaluation and Treatment: Rehabilitation  THERAPY DIAG:  Chronic pain of left knee  Other abnormalities of gait and mobility  Stiffness of left knee, not elsewhere classified  ONSET DATE: DOS 12/17   SUBJECTIVE:                                                                                                                                                                                           SUBJECTIVE  STATEMENT: Pt reports no soreness after last session. No pain at entry. States his dermatitis is improving.   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 Yes: NPRS scale: 0/10 Pain location: Lt knee Pain description: sore Aggravating factors: standing Relieving factors: meds  PRECAUTIONS:  None  RED FLAGS: None   WEIGHT BEARING RESTRICTIONS:  No  FALLS:  Has patient fallen in last 6 months? No  LIVING ENVIRONMENT: 2 steps going into the house   OCCUPATION:  Works three days a week. 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  PATIENT SURVEYS:  FOTO 30 FOTO visit 6: 54% (Goal of 62%)  COGNITIVE STATUS: Within functional limits for tasks assessed   SENSATION: WFL  EDEMA:  Yes: significant vs Rt   GAIT: Eval: amb with RW, antalgic gait.    Body Part #1 Knee  LOWER EXTREMITY ROM:     Passive  Left eval 12/26 1/8 1/10 1/17  Knee flexion 90 95 108 115 127  Knee extension -4 -3 -3 -4 -3   (Blank rows = not tested)   Eval: minimal quad activation noted  TREATMENT DATE:    1/17: Recumbent bike L 3 x57min PROM L knee  Measured 3-127 Quad set with heel prop 5" x10  LAQ 5" 2x10 3# HR/TR 2x15 Squats at rail x10 Sit to stands from elevated plinth  Step ups 6" 2x10 Tandem stance 20sec x3 L posterior   1/15 FOTO:54%  PROM L knee   SLR 3x10 LAQ 5" 2x10 3# HSC RTB 2x10 HR/TR 2x15 Squats at rail 2x10 Marches with min UE support 2x10 Step ups 4" 2x10 HEP update    1/10 Nu-step 4 min L4 PROM L knee flexion and extension Measured at -4-115deg flexion  Supine:   Quad set x15  SLR x5 (challenging today) LAQ 5" 2x10   Standing:   Slow march 2x10  Gait without SPC down hall and back- cues for increased heel contact     PATIENT EDUCATION:  Education details: Teacher, music of condition, POC, HEP, exercise form/rationale Person educated: Patient Education method: Explanation, Demonstration, Tactile cues, Verbal cues, and Handouts Education comprehension: verbalized understanding, returned demonstration, verbal cues required, tactile cues required, and needs further education  HOME EXERCISE PROGRAM:  [email or web address]  Access Code: [id number]   ASSESSMENT:  CLINICAL IMPRESSION: Significant improvement in knee flexion measure today, achieving 127 deg actively. Remains limited in full extension, likely due to swelling. Increased height to 6" with step ups today with good tolerance. Pt reports LE weakness with squats and STS. Will require additional strengthening for these  movements.   Eval: Patient is a 78 y.o. M who was seen today for physical therapy evaluation and treatment for s/p Lt TKA. Pt reports right knee is also popping frequently at this point. Has not been using ice as he does nto feel it penetrates the bandage. C/o severe pain when moving into a standing position and accepting weight.      REHAB POTENTIAL: Good  CLINICAL DECISION MAKING: Stable/uncomplicated  EVALUATION COMPLEXITY: Low   GOALS: Goals reviewed with patient? Yes  SHORT TERM GOALS: Target date: 1/10  ROM 0-90 without pain Baseline:see obj Goal status: INITIAL  2.  Able to demo heel strike with knee ext in ambulation Baseline: maintains flexion at eval Goal status: INITIAL    LONG TERM GOALS: Target date: POC date  Ambulate household and community distances pain <=2/10 Baseline:  Goal status: INITIAL  2.  Navigate stairs step over step Baseline:  Goal status: INITIAL  3.  ROM 0-120 Baseline:  Goal status: INITIAL  4.  Meet FOTO  goal Baseline:  Goal status: INITIAL  5.  Quad strength 85% of opposite LE Baseline:  Goal status: INITIAL  6.  Independent in long term HEP Baseline:  Goal status: INITIAL   PLAN:  PT FREQUENCY: 1-2x/week  PT DURATION: POC date  PLANNED INTERVENTIONS: 97164- PT Re-evaluation, 97110-Therapeutic exercises, 97530- Therapeutic activity, 97112- Neuromuscular re-education, 97535- Self Care, 10626- Manual therapy, [phone]- Gait training, [phone]- Aquatic Therapy, Patient/Family education, Balance training, Stair training, Taping, Dry Needling, Joint mobilization, Spinal mobilization, Scar mobilization, Cryotherapy, and Moist heat.  PLAN FOR NEXT SESSION: ROM, gross quad activation  Riki Altes, PTA  09/28/23 12:00 PM

## 2023-10-02 ENCOUNTER — Encounter (HOSPITAL_BASED_OUTPATIENT_CLINIC_OR_DEPARTMENT_OTHER): Payer: Medicare Other

## 2023-10-03 ENCOUNTER — Ambulatory Visit (HOSPITAL_BASED_OUTPATIENT_CLINIC_OR_DEPARTMENT_OTHER): Payer: Medicare Other

## 2023-10-03 ENCOUNTER — Encounter (HOSPITAL_BASED_OUTPATIENT_CLINIC_OR_DEPARTMENT_OTHER): Payer: Self-pay

## 2023-10-03 DIAGNOSIS — G8929 Other chronic pain: Secondary | ICD-10-CM

## 2023-10-03 DIAGNOSIS — M25562 Pain in left knee: Secondary | ICD-10-CM | POA: Diagnosis not present

## 2023-10-03 DIAGNOSIS — R2689 Other abnormalities of gait and mobility: Secondary | ICD-10-CM

## 2023-10-03 DIAGNOSIS — M25662 Stiffness of left knee, not elsewhere classified: Secondary | ICD-10-CM

## 2023-10-03 NOTE — Therapy (Signed)
OUTPATIENT PHYSICAL THERAPY TREATMENT   Patient Name: Troy [name] MRN: [id number] DOB:May 18, 1946, 78 y.o., male Today's Date: 10/03/2023  END OF SESSION:  PT End of Session - 10/03/23 0930     Visit Number 8    Number of Visits 17    Date for PT Re-Evaluation 10/26/22    Authorization Type BCBS MCR    Progress Note Due on Visit 10    PT Start Time 0926    PT Stop Time 1016    PT Time Calculation (min) 50 min    Activity Tolerance Patient tolerated treatment well    Behavior During Therapy [facility] for tasks assessed/performed                    Past Medical History:  Diagnosis Date   Abdominal distention    hernia   Anemia    Arthritis    big toes    Cancer (HCC)    prostate    Chronic kidney disease    Hypertension    Lumbar herniated disc    L5   Pneumonia    Rash    yeast infection from bladder sling surgery    Sciatica of right side 09/01/2011   right leg-tx. Tylenol   Sleep apnea    cpap   Past Surgical History:  Procedure Laterality Date   HERNIA REPAIR  September 01, 2010   INCONTINENCE SURGERY  Jan 31, 2011   INSERTION OF MESH  03/10/2015   Procedure: INSERTION OF MESH;  Surgeon: Luretha Murphy, MD;  Location: [facility];  Service: General;;   IR RADIOLOGIST EVAL & MGMT  05/15/2018   IR RADIOLOGIST EVAL & MGMT  07/13/2022   LAPAROSCOPIC NISSEN FUNDOPLICATION N/A 03/10/2015   Procedure: LAPAROSCOPIC NISSEN FUNDOPLICATION AND HIATAL HERNIA REPAIR;  Surgeon: Luretha Murphy, MD;  Location: [facility];  Service: General;  Laterality: N/A;   LAPAROTOMY N/A 05/29/2022   Procedure: laparoscopic assisted small bowel resection with anastamosis;  Surgeon: Sheliah Hatch, De Blanch, MD;  Location: [facility];  Service: General;  Laterality: N/A;   PROSTATECTOMY  reb 9, 2010   RADIOLOGY WITH ANESTHESIA N/A 08/30/2022   Procedure: RENAL CRYOABLATION;  Surgeon: Simonne Come, MD;  Location: [facility];  Service: Anesthesiology;  Laterality: N/A;   TONSILLECTOMY     TRANSURETHRAL  RESECTION OF BLADDER TUMOR N/A 06/15/2022   Procedure: TRANSURETHRAL RESECTION OF BLADDER TUMOR (TURBT)/ CYSTOSCOPY/ BILATERAL RETROGRADE/;  Surgeon: Heloise Purpura, MD;  Location: [facility];  Service: Urology;  Laterality: N/A;  GENERAL ANESTHESIA WITH PARALYSIS   UPPER GI ENDOSCOPY N/A 03/10/2015   Procedure: UPPER GI ENDOSCOPY;  Surgeon: Luretha Murphy, MD;  Location: [facility];  Service: General;  Laterality: N/A;   VENTRAL HERNIA REPAIR  09/06/2011   Procedure: HERNIA REPAIR VENTRAL ADULT;  Surgeon: Valarie Merino, MD;  Location: [facility];  Service: General;  Laterality: N/A;   VENTRAL HERNIA REPAIR  09/06/2011   Procedure: LAPAROSCOPIC VENTRAL HERNIA;  Surgeon: Valarie Merino, MD;  Location: [facility];  Service: General;  Laterality: N/A;   Patient Active Problem List   Diagnosis Date Noted   Renal cell carcinoma of right kidney (HCC) 08/30/2022   SBO (small bowel obstruction) (HCC) 05/24/2022   Bladder mass 05/24/2022   Renal mass 05/24/2022   Anxiety 05/24/2022   HTN (hypertension) 05/24/2022   HLD (hyperlipidemia) 05/24/2022   Primary osteoarthritis of left shoulder 05/05/2020   Diverticulitis 05/03/2018   Status post laparoscopic Nissen fundoplication and repair of large hiatus hernia  June 2016 03/10/2015   Ventral hernia-s/p repair x 2 09/07/2011   History of prostate cancer-s/p prostatectomy 09/07/2011     REFERRING PROVIDER:  Ollen Gross, MD    REFERRING DIAG: [id number] (ICD-10-CM) - Presence of left artificial knee joint   S/p Lt TKA  Rationale for Evaluation and Treatment: Rehabilitation  THERAPY DIAG:  Chronic pain of left knee  Other abnormalities of gait and mobility  Stiffness of left knee, not elsewhere classified  ONSET DATE: DOS 12/17   SUBJECTIVE:                                                                                                                                                                                           SUBJECTIVE  STATEMENT: Pt reports no incidence of "throbbing". Has measured swelling at home and states it is decreasing. 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 Yes: NPRS scale: 0/10 Pain location: Lt knee Pain description: sore Aggravating factors: standing Relieving factors: meds  PRECAUTIONS:  None  RED FLAGS: None   WEIGHT BEARING RESTRICTIONS:  No  FALLS:  Has patient fallen in last 6 months? No  LIVING ENVIRONMENT: 2 steps going into the house   OCCUPATION:  Works three days a week. 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  PATIENT SURVEYS:  FOTO 30 FOTO visit 6: 54% (Goal of 62%)  COGNITIVE STATUS: Within functional limits for tasks assessed   SENSATION: WFL  EDEMA:  Yes: significant vs Rt   GAIT: Eval: amb with RW, antalgic gait.    Body Part #1 Knee  LOWER EXTREMITY ROM:     Passive  Left eval 12/26 1/8 1/10 1/17  Knee flexion 90 95 108 115 127  Knee extension -4 -3 -3 -4 -3   (Blank rows = not tested)   Eval: minimal quad activation noted  TREATMENT DATE:   1/22: Recumbent bike L 3 x39min PROM L knee  Heel prop with manual overpressure Supine SLR 2x10 LAQ 5" 2x10 4# Seated HSC GTB x20 HR/TR 2x15 Squats at rail x10 Sit to stands from plinth 2x5 Step ups 6" 2x10 Eccentric step down 2" 2x10 Tandem stance 30sec x3 L posterior Marching on airex 2x10 SLS 20sec x3L (mild UE support) Gait in hall x1lap    1/17: Recumbent bike L 3 x63min PROM L knee  Measured 3-127 Quad set with heel prop 5" x10  LAQ 5"  2x10 3# HR/TR 2x15 Squats at rail x10 Sit to stands from elevated plinth  Step ups 6" 2x10 Tandem stance 20sec x3 L posterior   1/15 FOTO:54%  PROM L knee   SLR 3x10 LAQ 5" 2x10 3# HSC RTB 2x10 HR/TR 2x15 Squats at rail 2x10 Marches with min UE support 2x10 Step ups 4" 2x10 HEP update    1/10 Nu-step 4 min L4 PROM L knee flexion and extension Measured at -4-115deg flexion  Supine:  Quad set x15  SLR x5 (challenging today) LAQ 5" 2x10   Standing:   Slow march 2x10  Gait without SPC down hall and back- cues for increased heel contact     PATIENT EDUCATION:  Education details: Teacher, music of condition, POC, HEP, exercise form/rationale Person educated: Patient Education method: Explanation, Demonstration, Tactile cues, Verbal cues, and Handouts Education comprehension: verbalized understanding, returned demonstration, verbal cues required, tactile cues required, and needs further education  HOME EXERCISE PROGRAM:  [email or web address]  Access Code: [email or web address]   ASSESSMENT:  CLINICAL IMPRESSION: Pt able to achieve full extension actively following overpressure in heel prop position. Progressed with exercises today. Pt was able to tolerated increased resistance with LAQ and HSC without complaint. Challenged by sit to stands from lower surface. Practiced sit<>stand from rolling stool as pt will be sitting on this when he goes to work tomorrow. He did well with this with UE assist, which he states he will have at work. Pt has f/u with MD later today.   Eval: Patient is a 78 y.o. M who was seen today for physical therapy evaluation and treatment for s/p Lt TKA. Pt reports right knee is also popping frequently at this point. Has not been using ice as he does nto feel it penetrates the bandage. C/o severe pain when moving into a standing position and accepting weight.      REHAB POTENTIAL: Good  CLINICAL DECISION MAKING: Stable/uncomplicated  EVALUATION  COMPLEXITY: Low   GOALS: Goals reviewed with patient? Yes  SHORT TERM GOALS: Target date: 1/10  ROM 0-90 without pain Baseline:see obj Goal status: INITIAL  2.  Able to demo heel strike with knee ext in ambulation Baseline: maintains flexion at eval Goal status: INITIAL    LONG TERM GOALS: Target date: POC date  Ambulate household and community distances pain <=2/10 Baseline:  Goal status: INITIAL  2.  Navigate stairs step over step Baseline:  Goal status: INITIAL  3.  ROM 0-120 Baseline:  Goal status: INITIAL  4.  Meet FOTO goal Baseline:  Goal status: INITIAL  5.  Quad strength 85% of opposite LE Baseline:  Goal status: INITIAL  6.  Independent in long term HEP Baseline:  Goal status: INITIAL   PLAN:  PT FREQUENCY: 1-2x/week  PT DURATION: POC date  PLANNED INTERVENTIONS: 97164- PT Re-evaluation, 97110-Therapeutic exercises, 97530- Therapeutic activity, [id number]- Neuromuscular re-education, 97535- Self Care, 16109- Manual therapy, [phone]- Gait training, [phone]- Aquatic Therapy, Patient/Family education,  Balance training, Stair training, Taping, Dry Needling, Joint mobilization, Spinal mobilization, Scar mobilization, Cryotherapy, and Moist heat.  PLAN FOR NEXT SESSION: ROM, gross quad activation  Riki Altes, PTA  10/03/23 10:24 AM

## 2023-10-05 ENCOUNTER — Ambulatory Visit (HOSPITAL_BASED_OUTPATIENT_CLINIC_OR_DEPARTMENT_OTHER): Payer: Medicare Other

## 2023-10-05 ENCOUNTER — Encounter (HOSPITAL_BASED_OUTPATIENT_CLINIC_OR_DEPARTMENT_OTHER): Payer: Self-pay

## 2023-10-05 DIAGNOSIS — G8929 Other chronic pain: Secondary | ICD-10-CM

## 2023-10-05 DIAGNOSIS — M25562 Pain in left knee: Secondary | ICD-10-CM | POA: Diagnosis not present

## 2023-10-05 DIAGNOSIS — M25662 Stiffness of left knee, not elsewhere classified: Secondary | ICD-10-CM

## 2023-10-05 DIAGNOSIS — R2689 Other abnormalities of gait and mobility: Secondary | ICD-10-CM

## 2023-10-05 NOTE — Therapy (Signed)
OUTPATIENT PHYSICAL THERAPY TREATMENT   Patient Name: Troy [name] MRN: [id number] DOB:Jun 14, 1946, 78 y.o., male Today's Date: 10/05/2023  END OF SESSION:  PT End of Session - 10/05/23 1017     Visit Number 9    Number of Visits 17    Date for PT Re-Evaluation 10/26/22    Authorization Type BCBS MCR    Progress Note Due on Visit 10    PT Start Time 0930    PT Stop Time 1017    PT Time Calculation (min) 47 min    Activity Tolerance Patient tolerated treatment well    Behavior During Therapy WFL for tasks assessed/performed                     Past Medical History:  Diagnosis Date   Abdominal distention    hernia   Anemia    Arthritis    big toes    Cancer (HCC)    prostate    Chronic kidney disease    Hypertension    Lumbar herniated disc    L5   Pneumonia    Rash    yeast infection from bladder sling surgery    Sciatica of right side 09/01/2011   right leg-tx. Tylenol   Sleep apnea    cpap   Past Surgical History:  Procedure Laterality Date   HERNIA REPAIR  September 01, 2010   INCONTINENCE SURGERY  Jan 31, 2011   INSERTION OF MESH  03/10/2015   Procedure: INSERTION OF MESH;  Surgeon: Luretha Murphy, MD;  Location: [facility];  Service: General;;   IR RADIOLOGIST EVAL & MGMT  05/15/2018   IR RADIOLOGIST EVAL & MGMT  07/13/2022   LAPAROSCOPIC NISSEN FUNDOPLICATION N/A 03/10/2015   Procedure: LAPAROSCOPIC NISSEN FUNDOPLICATION AND HIATAL HERNIA REPAIR;  Surgeon: Luretha Murphy, MD;  Location: [facility];  Service: General;  Laterality: N/A;   LAPAROTOMY N/A 05/29/2022   Procedure: laparoscopic assisted small bowel resection with anastamosis;  Surgeon: Sheliah Hatch, De Blanch, MD;  Location: [facility];  Service: General;  Laterality: N/A;   PROSTATECTOMY  reb 9, 2010   RADIOLOGY WITH ANESTHESIA N/A 08/30/2022   Procedure: RENAL CRYOABLATION;  Surgeon: Simonne Come, MD;  Location: [facility];  Service: Anesthesiology;  Laterality: N/A;   TONSILLECTOMY     TRANSURETHRAL  RESECTION OF BLADDER TUMOR N/A 06/15/2022   Procedure: TRANSURETHRAL RESECTION OF BLADDER TUMOR (TURBT)/ CYSTOSCOPY/ BILATERAL RETROGRADE/;  Surgeon: Heloise Purpura, MD;  Location: [facility];  Service: Urology;  Laterality: N/A;  GENERAL ANESTHESIA WITH PARALYSIS   UPPER GI ENDOSCOPY N/A 03/10/2015   Procedure: UPPER GI ENDOSCOPY;  Surgeon: Luretha Murphy, MD;  [facility]: [facility];  Service: General;  Laterality: N/A;   VENTRAL HERNIA REPAIR  09/06/2011   Procedure: HERNIA REPAIR VENTRAL ADULT;  Surgeon: Valarie Merino, MD;  Location: [facility];  Service: General;  Laterality: N/A;   VENTRAL HERNIA REPAIR  09/06/2011   Procedure: LAPAROSCOPIC VENTRAL HERNIA;  Surgeon: Valarie Merino, MD;  Location: [facility];  Service: General;  Laterality: N/A;   Patient Active Problem List   Diagnosis Date Noted   Renal cell carcinoma of right kidney (HCC) 08/30/2022   SBO (small bowel obstruction) (HCC) 05/24/2022   Bladder mass 05/24/2022   Renal mass 05/24/2022   Anxiety 05/24/2022   HTN (hypertension) 05/24/2022   HLD (hyperlipidemia) 05/24/2022   Primary osteoarthritis of left shoulder 05/05/2020   Diverticulitis 05/03/2018   Status post laparoscopic Nissen fundoplication and repair of large hiatus  hernia June 2016 03/10/2015   Ventral hernia-s/p repair x 2 09/07/2011   History of prostate cancer-s/p prostatectomy 09/07/2011     REFERRING PROVIDER:  Ollen Gross, MD    REFERRING DIAG: [phone] (ICD-10-CM) - Presence of left artificial knee joint   S/p Lt TKA  Rationale for Evaluation and Treatment: Rehabilitation  THERAPY DIAG:  Chronic pain of left knee  Other abnormalities of gait and mobility  Stiffness of left knee, not elsewhere classified  ONSET DATE: DOS 12/17   SUBJECTIVE:                                                                                                                                                                                           SUBJECTIVE  STATEMENT: Pt reports he worked yesterday and slept a few hours right after. "It took a lot out of me." Saw MD   PERTINENT HISTORY:  Arthritis hips left knee and left shoulder; Herniated lumbar disc; Prostate cancer   PAIN:  Are you having pain? Yes: NPRS scale: 0/10 Pain location: Lt knee Pain description: sore Aggravating factors: standing Relieving factors: meds  PRECAUTIONS:  None  RED FLAGS: None   WEIGHT BEARING RESTRICTIONS:  No  FALLS:  Has patient fallen in last 6 months? No  LIVING ENVIRONMENT: 2 steps going into the house   OCCUPATION:  Works three days a week. 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  PATIENT SURVEYS:  FOTO 30 FOTO visit 6: 54% (Goal of 62%)  COGNITIVE STATUS: Within functional limits for tasks assessed   SENSATION: WFL  EDEMA:  Yes: significant vs Rt   GAIT: Eval: amb with RW, antalgic gait.    Body Part #1 Knee  LOWER EXTREMITY ROM:     Passive  Left eval 12/26 1/8 1/10 1/17  Knee flexion 90 95 108 115 127  Knee extension -4 -3 -3 -4 -3   (Blank rows = not tested)   Eval: minimal quad activation noted  TREATMENT DATE:    1/24: Recumbent bike L 3 x35min PROM L knee  Long sit HSS Leg press (cybex) 75# 2x10 Leg extension machine 15# x10, 10# x10 HS curl machine 25# 2x10 Calf press machine 55# x20 Step ups 6" 2x10 Stair climbing 1/2 flight x1 reciprocal   1/22: Recumbent bike L 3 x31min PROM L knee  Heel prop with manual overpressure Supine SLR 2x10 LAQ 5" 2x10 4# Seated HSC GTB x20 HR/TR 2x15 Squats at rail x10 Sit to stands from plinth 2x5 Step ups 6" 2x10 Eccentric step down 2" 2x10 Tandem stance 30sec x3  L posterior Marching on airex 2x10 SLS 20sec x3L (mild UE support) Gait in hall x1lap    1/17: Recumbent bike L 3 x58min PROM L knee  Measured 3-127 Quad set with heel prop 5" x10  LAQ 5" 2x10 3# HR/TR 2x15 Squats at rail x10 Sit to stands from elevated plinth  Step ups 6" 2x10 Tandem stance 20sec x3 L posterior   1/15 FOTO:54%  PROM L knee   SLR 3x10 LAQ 5" 2x10 3# HSC RTB 2x10 HR/TR 2x15 Squats at rail 2x10 Marches with min UE support 2x10 Step ups 4" 2x10 HEP update    1/10 Nu-step 4 min L4 PROM L knee flexion and extension Measured at -4-115deg flexion  Supine:  Quad set x15  SLR x5 (challenging today) LAQ 5" 2x10   Standing:   Slow march 2x10  Gait without SPC down hall and back- cues for increased heel contact     PATIENT EDUCATION:  Education details: Teacher, music of condition, POC, HEP, exercise form/rationale Person educated: Patient Education method: Explanation, Demonstration, Tactile cues, Verbal cues, and Handouts Education comprehension: verbalized understanding, returned demonstration, verbal cues required, tactile cues required, and needs further education  HOME EXERCISE PROGRAM:  [email or web address]  Access Code: [email or web address]   ASSESSMENT:  CLINICAL IMPRESSION: Pt significantly tight in L HS with long sitting HSS. Pt will benefit from adding this to HEP. Pt requested to trial some gym equipment today so that he feels more comfortable returning to [employer]. Pt did well with machine strengthening, though he did fatigue quickly. Demonstrated safe performance of elliptical. MD advised him to avoid treadmill due to impact on knees. Pt able to climb 1/2 flight of stairs reciprocally with use of railing on one side. Will continue to work on functional strengthening in clinic.   Eval: Patient is a 78 y.o. M who was seen today for physical therapy evaluation and treatment for s/p Lt TKA. Pt reports right knee is also popping frequently  at this point. Has not been using ice as he does nto feel it penetrates the bandage. C/o severe pain when moving into a standing position and accepting weight.      REHAB POTENTIAL: Good  CLINICAL DECISION MAKING: Stable/uncomplicated  EVALUATION COMPLEXITY: Low   GOALS: Goals reviewed with patient? Yes  SHORT TERM GOALS: Target date: 1/10  ROM 0-90 without pain Baseline:see obj Goal status: INITIAL  2.  Able to demo heel strike with knee ext in ambulation Baseline: maintains flexion at eval Goal status: INITIAL    LONG TERM GOALS: Target date: POC date  Ambulate household and community distances pain <=2/10 Baseline:  Goal status: INITIAL  2.  Navigate stairs step over step Baseline:  Goal status: INITIAL  3.  ROM 0-120 Baseline:  Goal status: INITIAL  4.  Meet FOTO goal Baseline:  Goal status: INITIAL  5.  Quad strength 85% of opposite LE Baseline:  Goal status: INITIAL  6.  Independent in long term HEP Baseline:  Goal status: INITIAL   PLAN:  PT FREQUENCY: 1-2x/week  PT DURATION: POC date  PLANNED INTERVENTIONS: 97164- PT Re-evaluation, 97110-Therapeutic exercises, 97530- Therapeutic activity, 97112- Neuromuscular re-education, 97535- Self Care, 40981- Manual therapy, [phone]- Gait training, [phone]- Aquatic Therapy, Patient/Family education, Balance training, Stair training, Taping, Dry Needling, Joint mobilization, Spinal mobilization, Scar mobilization, Cryotherapy, and Moist heat.  PLAN FOR NEXT SESSION: ROM, gross quad activation  Riki Altes, PTA  10/05/23 10:36 AM

## 2023-10-09 ENCOUNTER — Encounter (HOSPITAL_BASED_OUTPATIENT_CLINIC_OR_DEPARTMENT_OTHER): Payer: Medicare Other | Admitting: Physical Therapy

## 2023-10-10 ENCOUNTER — Ambulatory Visit (HOSPITAL_BASED_OUTPATIENT_CLINIC_OR_DEPARTMENT_OTHER): Payer: Medicare Other | Admitting: Physical Therapy

## 2023-10-10 ENCOUNTER — Encounter (HOSPITAL_BASED_OUTPATIENT_CLINIC_OR_DEPARTMENT_OTHER): Payer: Self-pay | Admitting: Physical Therapy

## 2023-10-10 DIAGNOSIS — M25662 Stiffness of left knee, not elsewhere classified: Secondary | ICD-10-CM

## 2023-10-10 DIAGNOSIS — G8929 Other chronic pain: Secondary | ICD-10-CM

## 2023-10-10 DIAGNOSIS — M25562 Pain in left knee: Secondary | ICD-10-CM | POA: Diagnosis not present

## 2023-10-10 DIAGNOSIS — R2689 Other abnormalities of gait and mobility: Secondary | ICD-10-CM

## 2023-10-10 NOTE — Therapy (Signed)
OUTPATIENT PHYSICAL THERAPY TREATMENT   Patient Name: Troy [name] MRN: [id number] DOB:01-03-1946, 78 y.o., male Today's Date: 10/11/2023  END OF SESSION:  PT End of Session - 10/10/23 1343     Visit Number 10    Number of Visits 17    Date for PT Re-Evaluation 10/26/22    PT Start Time 0930    PT Stop Time 1012    PT Time Calculation (min) 42 min    Activity Tolerance Patient tolerated treatment well    Behavior During Therapy [facility] for tasks assessed/performed                      Past Medical History:  Diagnosis Date   Abdominal distention    hernia   Anemia    Arthritis    big toes    Cancer (HCC)    prostate    Chronic kidney disease    Hypertension    Lumbar herniated disc    L5   Pneumonia    Rash    yeast infection from bladder sling surgery    Sciatica of right side 09/01/2011   right leg-tx. Tylenol   Sleep apnea    cpap   Past Surgical History:  Procedure Laterality Date   HERNIA REPAIR  September 01, 2010   INCONTINENCE SURGERY  Jan 31, 2011   INSERTION OF MESH  03/10/2015   Procedure: INSERTION OF MESH;  Surgeon: Luretha Murphy, MD;  Location: [facility];  Service: General;;   IR RADIOLOGIST EVAL & MGMT  05/15/2018   IR RADIOLOGIST EVAL & MGMT  07/13/2022   LAPAROSCOPIC NISSEN FUNDOPLICATION N/A 03/10/2015   Procedure: LAPAROSCOPIC NISSEN FUNDOPLICATION AND HIATAL HERNIA REPAIR;  Surgeon: Luretha Murphy, MD;  Location: [facility];  Service: General;  Laterality: N/A;   LAPAROTOMY N/A 05/29/2022   Procedure: laparoscopic assisted small bowel resection with anastamosis;  Surgeon: Sheliah Hatch, De Blanch, MD;  Location: [facility];  Service: General;  Laterality: N/A;   PROSTATECTOMY  reb 9, 2010   RADIOLOGY WITH ANESTHESIA N/A 08/30/2022   Procedure: RENAL CRYOABLATION;  Surgeon: Simonne Come, MD;  Location: [facility];  Service: Anesthesiology;  Laterality: N/A;   TONSILLECTOMY     TRANSURETHRAL RESECTION OF BLADDER TUMOR N/A 06/15/2022   Procedure:  TRANSURETHRAL RESECTION OF BLADDER TUMOR (TURBT)/ CYSTOSCOPY/ BILATERAL RETROGRADE/;  Surgeon: Heloise Purpura, MD;  Location: [facility];  Service: Urology;  Laterality: N/A;  GENERAL ANESTHESIA WITH PARALYSIS   UPPER GI ENDOSCOPY N/A 03/10/2015   Procedure: UPPER GI ENDOSCOPY;  Surgeon: Luretha Murphy, MD;  [facility];  Service: General;  Laterality: N/A;   VENTRAL HERNIA REPAIR  09/06/2011   Procedure: HERNIA REPAIR VENTRAL ADULT;  Surgeon: Valarie Merino, MD;  [facility];  Service: General;  Laterality: N/A;   VENTRAL HERNIA REPAIR  09/06/2011   Procedure: LAPAROSCOPIC VENTRAL HERNIA;  Surgeon: Valarie Merino, MD;  Location[facility];  Service: General;  Laterality: N/A;   Patient Active Problem List   Diagnosis Date Noted   Renal cell carcinoma of right kidney (HCC) 08/30/2022   SBO (small bowel obstruction) (HCC) 05/24/2022   Bladder mass 05/24/2022   Renal mass 05/24/2022   Anxiety 05/24/2022   HTN (hypertension) 05/24/2022   HLD (hyperlipidemia) 05/24/2022   Primary osteoarthritis of left shoulder 05/05/2020   Diverticulitis 05/03/2018   Status post laparoscopic Nissen fundoplication and repair of large hiatus hernia June 2016 03/10/2015   Ventral hernia-s/p repair x 2 09/07/2011   History  of prostate cancer-s/p prostatectomy 09/07/2011   Progress Note Reporting Period  to 10/10/2023  See note below for Objective Data and Assessment of Progress/Goals.       REFERRING PROVIDER:  Ollen Gross, MD    REFERRING DIAG: [phone] (ICD-10-CM) - Presence of left artificial knee joint   S/p Lt TKA  Rationale for Evaluation and Treatment: Rehabilitation  THERAPY DIAG:  Chronic pain of left knee  Other abnormalities of gait and mobility  Stiffness of left knee, not elsewhere classified  ONSET DATE: DOS 12/17   SUBJECTIVE:                                                                                                                                                                                            SUBJECTIVE STATEMENT: Patient reports fatigue with work but no real pain.   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 Yes: NPRS scale: 0/10 Pain location: Lt knee Pain description: sore Aggravating factors: standing Relieving factors: meds  PRECAUTIONS:  None  RED FLAGS: None   WEIGHT BEARING RESTRICTIONS:  No  FALLS:  Has patient fallen in last 6 months? No  LIVING ENVIRONMENT: 2 steps going into the house   OCCUPATION:  Works three days a week. 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  PATIENT SURVEYS:  FOTO 30 FOTO visit 6: 54% (Goal of 62%)  COGNITIVE STATUS: Within functional limits for tasks assessed   SENSATION: WFL  EDEMA:  Yes: significant vs Rt   GAIT: Eval: amb with RW, antalgic gait.    Body Part #1 Knee  LOWER EXTREMITY ROM:     Passive  Left eval 12/26 1/8 1/10 1/17  Knee flexion 90 95 108 115 127  Knee extension -4 -3 -3 -4 -3   (Blank rows = not tested)   Eval: minimal quad activation noted  TREATMENT DATE:     1/29 Manual: PROM into flexion and extension   SLR 3 lbs 2x10  SAQ 3lbs 2x15   Step up 6 inch 2x12 Step Down 4 inch 2x12   Hamstring curl machine 25 lbs 2x15      1/24: Recumbent bike L 3 x78min PROM L knee  Long sit HSS Leg press (cybex) 75# 2x10 Leg extension machine 15# x10, 10# x10 HS curl machine 25# 2x10 Calf press machine 55# x20 Step ups 6" 2x10 Stair climbing 1/2 flight x1 reciprocal   1/22: Recumbent bike L 3 x48min PROM L knee  Heel prop with manual  overpressure Supine SLR 2x10 LAQ 5" 2x10 4# Seated HSC GTB x20 HR/TR 2x15 Squats at rail x10 Sit to stands from plinth 2x5 Step ups 6" 2x10 Eccentric step down 2" 2x10 Tandem stance 30sec x3 L posterior Marching on airex 2x10 SLS 20sec x3L (mild UE support) Gait in hall x1lap    1/17: Recumbent bike L 3 x18min PROM L knee  Measured 3-127 Quad set with heel prop 5" x10  LAQ 5" 2x10 3# HR/TR 2x15 Squats at rail x10 Sit to stands from elevated plinth  Step ups 6" 2x10 Tandem stance 20sec x3 L posterior   1/15 FOTO:54%  PROM L knee   SLR 3x10 LAQ 5" 2x10 3# HSC RTB 2x10 HR/TR 2x15 Squats at rail 2x10 Marches with min UE support 2x10 Step ups 4" 2x10 HEP update    1/10 Nu-step 4 min L4 PROM L knee flexion and extension Measured at -4-115deg flexion  Supine:  Quad set x15  SLR x5 (challenging today) LAQ 5" 2x10   Standing:   Slow march 2x10  Gait without SPC down hall and back- cues for increased heel contact     PATIENT EDUCATION:  Education details: Teacher, music of condition, POC, HEP, exercise form/rationale Person educated: Patient Education method: Explanation, Demonstration, Tactile cues, Verbal cues, and Handouts Education comprehension: verbalized understanding, returned demonstration, verbal cues required, tactile cues required, and needs further education  HOME EXERCISE PROGRAM:  [email or web address]  Access Code: [email or web address]   ASSESSMENT:  CLINICAL IMPRESSION: The patient had full ROM today. His strength is progressing well. He is back to the gym. He did well with gym activity. He worked on step downs today as well. He tolerated well. We will continue to progress as tolerated. See below for goal specific progress     Eval: Patient is a 78 y.o. M who was seen today for physical therapy evaluation and treatment for s/p Lt TKA. Pt reports right knee is also popping frequently at this point. Has not been using ice as he does nto feel it  penetrates the bandage. C/o severe pain when moving into a standing position and accepting weight.      REHAB POTENTIAL: Good  CLINICAL DECISION MAKING: Stable/uncomplicated  EVALUATION COMPLEXITY: Low   GOALS: Goals reviewed with patient? Yes  SHORT TERM GOALS: Target date: 1/10  ROM 0-90 without pain Baseline:see obj Goal status: 1/29 achieved L  2.  Able to demo heel strike with knee ext in ambulation Baseline: maintains flexion at eval Goal status: INITIAL    LONG TERM GOALS: Target date: POC date  Ambulate household and community distances pain <=2/10 Baseline:  Goal status: INITIAL  2.  Navigate stairs step over step Baseline:  Goal status: INITIAL  3.  ROM 0-120 Baseline:  Goal status: INITIAL  4.  Meet FOTO goal Baseline:  Goal status: INITIAL  5.  [employer]  strength 85% of opposite LE Baseline:  Goal status: INITIAL  6.  Independent in long term HEP Baseline:  Goal status: INITIAL   PLAN:  PT FREQUENCY: 1-2x/week  PT DURATION: POC date  PLANNED INTERVENTIONS: 97164- PT Re-evaluation, 97110-Therapeutic exercises, 97530- Therapeutic activity, 97112- Neuromuscular re-education, 97535- Self Care, 65784- Manual therapy, [phone]- Gait training, [phone]- Aquatic Therapy, Patient/Family education, Balance training, Stair training, Taping, Dry Needling, Joint mobilization, Spinal mobilization, Scar mobilization, Cryotherapy, and Moist heat.  PLAN FOR NEXT SESSION: ROM, gross quad activation  Lorayne Bender PT DPT  10/11/23 9:09 AM

## 2023-10-11 ENCOUNTER — Encounter (HOSPITAL_BASED_OUTPATIENT_CLINIC_OR_DEPARTMENT_OTHER): Payer: Self-pay | Admitting: Physical Therapy

## 2023-10-12 ENCOUNTER — Encounter (HOSPITAL_BASED_OUTPATIENT_CLINIC_OR_DEPARTMENT_OTHER): Payer: Medicare Other | Admitting: Physical Therapy

## 2023-10-16 ENCOUNTER — Encounter (HOSPITAL_BASED_OUTPATIENT_CLINIC_OR_DEPARTMENT_OTHER): Payer: Medicare Other

## 2023-10-17 ENCOUNTER — Encounter (HOSPITAL_BASED_OUTPATIENT_CLINIC_OR_DEPARTMENT_OTHER): Payer: Self-pay

## 2023-10-17 ENCOUNTER — Ambulatory Visit (HOSPITAL_BASED_OUTPATIENT_CLINIC_OR_DEPARTMENT_OTHER): Payer: Medicare Other | Attending: Physician Assistant

## 2023-10-17 DIAGNOSIS — G8929 Other chronic pain: Secondary | ICD-10-CM | POA: Diagnosis present

## 2023-10-17 DIAGNOSIS — M25562 Pain in left knee: Secondary | ICD-10-CM | POA: Diagnosis present

## 2023-10-17 DIAGNOSIS — R2689 Other abnormalities of gait and mobility: Secondary | ICD-10-CM

## 2023-10-17 DIAGNOSIS — M25662 Stiffness of left knee, not elsewhere classified: Secondary | ICD-10-CM | POA: Diagnosis present

## 2023-10-17 NOTE — Therapy (Signed)
 OUTPATIENT PHYSICAL THERAPY TREATMENT   Patient Name: Troy [name] MRN: [id number] DOB:22-Aug-1946, 78 y.o., male Today's Date: 10/17/2023  END OF SESSION:  PT End of Session - 10/17/23 0934     Visit Number 11    Number of Visits 17    Date for PT Re-Evaluation 10/26/22    Authorization Type [employer] MCR    Progress Note Due on Visit 20    PT Start Time 0931    PT Stop Time 1015    PT Time Calculation (min) 44 min    Activity Tolerance Patient tolerated treatment well    Behavior During Therapy [facility] for tasks assessed/performed                       Past Medical History:  Diagnosis Date   Abdominal distention    hernia   Anemia    Arthritis    big toes    Cancer (HCC)    prostate    Chronic kidney disease    Hypertension    Lumbar herniated disc    L5   Pneumonia    Rash    yeast infection from bladder sling surgery    Sciatica of right side 09/01/2011   right leg-tx. Tylenol    Sleep apnea    cpap   Past Surgical History:  Procedure Laterality Date   HERNIA REPAIR  September 01, 2010   INCONTINENCE SURGERY  Jan 31, 2011   INSERTION OF MESH  03/10/2015   Procedure: INSERTION OF MESH;  Surgeon: Donnice Lunger, MD;  [facility]: [facility];  Service: General;;   IR RADIOLOGIST EVAL & MGMT  05/15/2018   IR RADIOLOGIST EVAL & MGMT  07/13/2022   LAPAROSCOPIC NISSEN FUNDOPLICATION N/A 03/10/2015   Procedure: LAPAROSCOPIC NISSEN FUNDOPLICATION AND HIATAL HERNIA REPAIR;  Surgeon: Donnice Lunger, MD;  [facility];  Service: General;  Laterality: N/A;   LAPAROTOMY N/A 05/29/2022   Procedure: laparoscopic assisted small bowel resection with anastamosis;  Surgeon: Stevie, Herlene Righter, MD;  [facility];  Service: General;  Laterality: N/A;   PROSTATECTOMY  reb 9, 2010   RADIOLOGY WITH ANESTHESIA N/A 08/30/2022   Procedure: RENAL CRYOABLATION;  Surgeon: Adele Rush, MD;  [facility];  Service: Anesthesiology;  Laterality: N/A;   TONSILLECTOMY      TRANSURETHRAL RESECTION OF BLADDER TUMOR N/A 06/15/2022   Procedure: TRANSURETHRAL RESECTION OF BLADDER TUMOR (TURBT)/ CYSTOSCOPY/ BILATERAL RETROGRADE/;  Surgeon: Renda Glance, MD;  [facility];  Service: Urology;  Laterality: N/A;  GENERAL ANESTHESIA WITH PARALYSIS   UPPER GI ENDOSCOPY N/A 03/10/2015   Procedure: UPPER GI ENDOSCOPY;  Surgeon: Donnice Lunger, MD;  [facility];  Service: General;  Laterality: N/A;   VENTRAL HERNIA REPAIR  09/06/2011   Procedure: HERNIA REPAIR VENTRAL ADULT;  Surgeon: Donnice KATHEE Lunger, MD;  [facility];  Service: General;  Laterality: N/A;   VENTRAL HERNIA REPAIR  09/06/2011   Procedure: LAPAROSCOPIC VENTRAL HERNIA;  Surgeon: Donnice KATHEE Lunger, MD;  [facility];  Service: General;  Laterality: N/A;   Patient Active Problem List   Diagnosis Date Noted   Renal cell carcinoma of right kidney (HCC) 08/30/2022   SBO (small bowel obstruction) (HCC) 05/24/2022   Bladder mass 05/24/2022   Renal mass 05/24/2022   Anxiety 05/24/2022   HTN (hypertension) 05/24/2022   HLD (hyperlipidemia) 05/24/2022   Primary osteoarthritis of left shoulder 05/05/2020   Diverticulitis 05/03/2018   Status post laparoscopic Nissen fundoplication and repair of  large hiatus hernia June 2016 03/10/2015   Ventral hernia-s/p repair x 2 09/07/2011   History of prostate cancer-s/p prostatectomy 09/07/2011        REFERRING PROVIDER:  Melodi Lerner, MD    REFERRING DIAG: [id number] (ICD-10-CM) - Presence of left artificial knee joint   S/p Lt TKA  Rationale for Evaluation and Treatment: Rehabilitation  THERAPY DIAG:  Chronic pain of left knee  Other abnormalities of gait and mobility  Stiffness of left knee, not elsewhere classified  ONSET DATE: DOS 12/17   SUBJECTIVE:                                                                                                                                                                                            SUBJECTIVE STATEMENT: Minimal pain, 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 He denies difficulty with sleep. I want to try the stairs today all the way up.   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 Yes: NPRS scale: 0/10 Pain location: Lt knee Pain description: sore Aggravating factors: standing Relieving factors: meds  PRECAUTIONS:  None  RED FLAGS: None   WEIGHT BEARING RESTRICTIONS:  No  FALLS:  Has patient fallen in last 6 months? No  LIVING ENVIRONMENT: 2 steps going into the house   OCCUPATION:  Works three days a week. 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  PATIENT SURVEYS:  FOTO 30 FOTO visit 6: 54% (Goal of 62%)  COGNITIVE STATUS: Within functional limits for tasks assessed   SENSATION: WFL  EDEMA:  Yes: significant vs Rt   GAIT: Eval: amb with RW, antalgic gait.    Body Part #1 Knee  LOWER EXTREMITY ROM:     Passive  Left eval 12/26 1/8 1/10 1/17 2/5  Knee flexion 90 95 108 115 127 127  Knee extension -4 -3 -3 -4 -3 0   (Blank rows = not tested)   Eval: minimal quad activation noted  TREATMENT DATE:   2/5 Recumbent bike L 4 x19min PROM L knee  ROM 0-127 Long sit HSS Leg press (cybex) 75# 3x10 Leg extension machine 10# 3x10 HS curl machine 25# 3x10 Step ups 8 2x10 Stair climbing full flight x1 reciprocal Sit to stands x10  1/29 Manual: PROM into flexion and extension   SLR 3 lbs 2x10  SAQ 3lbs 2x15   Step up 6 inch 2x12 Step Down 4 inch 2x12   Hamstring curl machine 25 lbs 2x15      1/24: Recumbent bike L 3  x61min PROM L knee  Long sit HSS Leg press (cybex) 75# 2x10 Leg extension machine 15# x10, 10# x10 HS curl machine 25# 2x10 Calf press machine 55# x20 Step ups 6 2x10 Stair climbing 1/2 flight x1 reciprocal   1/22: Recumbent bike L 3 x37min PROM L knee  Heel prop with manual overpressure Supine SLR 2x10 LAQ 5 2x10 4# Seated HSC GTB x20 HR/TR 2x15 Squats at rail x10 Sit to stands from plinth 2x5 Step ups 6 2x10 Eccentric step down 2 2x10 Tandem stance 30sec x3 L posterior Marching on airex 2x10 SLS 20sec x3L (mild UE support) Gait in hall x1lap    PATIENT EDUCATION:  Education details: Anatomy of condition, POC, HEP, exercise form/rationale Person educated: Patient Education method: Explanation, Demonstration, Tactile cues, Verbal cues, and Handouts Education comprehension: verbalized understanding, returned demonstration, verbal cues required, tactile cues required, and needs further education  HOME EXERCISE PROGRAM:  [email or web address]  Access Code: [email or web address]   ASSESSMENT:  CLINICAL IMPRESSION: Pt progressing well towards goals. Measured 0-127 deg knee ROM today.  Able to negotiate full flight of stairs reciprocally with singe rail use. He did report fatigue/challenge with ascending steps due to LE weakness. Increased height with step up to 8 without complaint of pain, though mild increase in difficulty. Pt also reported challenge with sit to stands. Pt performed well with progressions to machine strengthening. Plans to utilize [employer] for ongoing strengthening upon conclusion of PT.    Eval: Patient is a 78 y.o. M who was seen today for physical therapy evaluation and treatment for s/p Lt TKA. Pt reports right knee is also popping frequently at this point. Has not been using ice as he does nto feel it penetrates the bandage. C/o severe pain when moving into a standing position and accepting weight.      REHAB POTENTIAL: Good  CLINICAL DECISION  MAKING: Stable/uncomplicated  EVALUATION COMPLEXITY: Low   GOALS: Goals reviewed with patient? Yes  SHORT TERM GOALS: Target date: 1/10  ROM 0-90 without pain Baseline:see obj Goal status: 1/29 achieved L  2.  Able to demo heel strike with knee ext in ambulation Baseline: maintains flexion at eval Goal status: INITIAL    LONG TERM GOALS: Target date: POC date  Ambulate household and community distances pain <=2/10 Baseline:  Goal status: MET 2/5 (walked through [employer] without issue)  2.  Navigate stairs step over step Baseline:  Goal status: MET 2/5  3.  ROM 0-120 Baseline:  Goal status: MET  4.  Meet FOTO goal Baseline:  Goal status: IN PROGRESS  5.  Quad strength 85% of opposite LE Baseline:  Goal status: INITIAL  6.  Independent in long term HEP Baseline:  Goal status: INITIAL   PLAN:  PT FREQUENCY: 1-2x/week  PT DURATION: POC date  PLANNED INTERVENTIONS: 97164- PT Re-evaluation, 97110-Therapeutic exercises, 97530- Therapeutic activity, [id number]- Neuromuscular re-education, 97535- Self Care, [id number]- Manual therapy, [id number]- Gait training, [phone]-  Aquatic Therapy, Patient/Family education, Balance training, Stair training, Taping, Dry Needling, Joint mobilization, Spinal mobilization, Scar mobilization, Cryotherapy, and Moist heat.  PLAN FOR NEXT SESSION: ROM, gross quad activation  Asberry Rodes, PTA   10/17/23 10:51 AM

## 2023-10-19 ENCOUNTER — Encounter (HOSPITAL_BASED_OUTPATIENT_CLINIC_OR_DEPARTMENT_OTHER): Payer: Self-pay | Admitting: Physical Therapy

## 2023-10-19 ENCOUNTER — Ambulatory Visit (HOSPITAL_BASED_OUTPATIENT_CLINIC_OR_DEPARTMENT_OTHER): Payer: Medicare Other | Admitting: Physical Therapy

## 2023-10-19 DIAGNOSIS — M25662 Stiffness of left knee, not elsewhere classified: Secondary | ICD-10-CM

## 2023-10-19 DIAGNOSIS — G8929 Other chronic pain: Secondary | ICD-10-CM

## 2023-10-19 DIAGNOSIS — M25562 Pain in left knee: Secondary | ICD-10-CM | POA: Diagnosis not present

## 2023-10-19 DIAGNOSIS — R2689 Other abnormalities of gait and mobility: Secondary | ICD-10-CM

## 2023-10-19 NOTE — Therapy (Signed)
 OUTPATIENT PHYSICAL THERAPY TREATMENT   Patient Name: Troy [name] MRN: [id number] DOB:Mar 07, 1946, 78 y.o., male Today's Date: 10/19/2023  END OF SESSION:  PT End of Session - 10/19/23 0937     Visit Number 12    Number of Visits 17    Date for PT Re-Evaluation 10/26/22    Authorization Type BCBS MCR    PT Start Time 0934    PT Stop Time 1015    PT Time Calculation (min) 41 min    Activity Tolerance Patient tolerated treatment well    Behavior During Therapy [facility] for tasks assessed/performed                       Past Medical History:  Diagnosis Date   Abdominal distention    hernia   Anemia    Arthritis    big toes    Cancer (HCC)    prostate    Chronic kidney disease    Hypertension    Lumbar herniated disc    L5   Pneumonia    Rash    yeast infection from bladder sling surgery    Sciatica of right side 09/01/2011   right leg-tx. Tylenol    Sleep apnea    cpap   Past Surgical History:  Procedure Laterality Date   HERNIA REPAIR  September 01, 2010   INCONTINENCE SURGERY  Jan 31, 2011   INSERTION OF MESH  03/10/2015   Procedure: INSERTION OF MESH;  Surgeon: Donnice Lunger, MD;  Location: [facility];  Service: General;;   IR RADIOLOGIST EVAL & MGMT  05/15/2018   IR RADIOLOGIST EVAL & MGMT  07/13/2022   LAPAROSCOPIC NISSEN FUNDOPLICATION N/A 03/10/2015   Procedure: LAPAROSCOPIC NISSEN FUNDOPLICATION AND HIATAL HERNIA REPAIR;  Surgeon: Donnice Lunger, MD;  [facility]: [facility];  Service: General;  Laterality: N/A;   LAPAROTOMY N/A 05/29/2022   Procedure: laparoscopic assisted small bowel resection with anastamosis;  Surgeon: Stevie, Herlene Righter, MD;  Location: [facility];  Service: General;  Laterality: N/A;   PROSTATECTOMY  reb 9, 2010   RADIOLOGY WITH ANESTHESIA N/A 08/30/2022   Procedure: RENAL CRYOABLATION;  Surgeon: Adele Rush, MD;  [facility]: [facility];  Service: Anesthesiology;  Laterality: N/A;   TONSILLECTOMY     TRANSURETHRAL RESECTION OF BLADDER TUMOR N/A  06/15/2022   Procedure: TRANSURETHRAL RESECTION OF BLADDER TUMOR (TURBT)/ CYSTOSCOPY/ BILATERAL RETROGRADE/;  Surgeon: Renda Glance, MD;  [facility];  Service: Urology;  Laterality: N/A;  GENERAL ANESTHESIA WITH PARALYSIS   UPPER GI ENDOSCOPY N/A 03/10/2015   Procedure: UPPER GI ENDOSCOPY;  Surgeon: Donnice Lunger, MD;  [facility];  Service: General;  Laterality: N/A;   VENTRAL HERNIA REPAIR  09/06/2011   Procedure: HERNIA REPAIR VENTRAL ADULT;  Surgeon: Donnice KATHEE Lunger, MD;  [facility];  Service: General;  Laterality: N/A;   VENTRAL HERNIA REPAIR  09/06/2011   Procedure: LAPAROSCOPIC VENTRAL HERNIA;  Surgeon: Donnice KATHEE Lunger, MD;  [facility];  Service: General;  Laterality: N/A;   Patient Active Problem List   Diagnosis Date Noted   Renal cell carcinoma of right kidney (HCC) 08/30/2022   SBO (small bowel obstruction) (HCC) 05/24/2022   Bladder mass 05/24/2022   Renal mass 05/24/2022   Anxiety 05/24/2022   HTN (hypertension) 05/24/2022   HLD (hyperlipidemia) 05/24/2022   Primary osteoarthritis of left shoulder 05/05/2020   Diverticulitis 05/03/2018   Status post laparoscopic Nissen fundoplication and repair of large hiatus hernia June 2016 03/10/2015   Ventral  hernia-s/p repair x 2 09/07/2011   History of prostate cancer-s/p prostatectomy 09/07/2011        REFERRING PROVIDER:  Melodi Lerner, MD    REFERRING DIAG: [phone] (ICD-10-CM) - Presence of left artificial knee joint   S/p Lt TKA  Rationale for Evaluation and Treatment: Rehabilitation  THERAPY DIAG:  Chronic pain of left knee  Other abnormalities of gait and mobility  Stiffness of left knee, not elsewhere classified  ONSET DATE: DOS 12/17   SUBJECTIVE:                                                                                                                                                                                           SUBJECTIVE STATEMENT: The patient continues to  make good progress.  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 Yes: NPRS scale: 0/10 Pain location: Lt knee Pain description: sore Aggravating factors: standing Relieving factors: meds  PRECAUTIONS:  None  RED FLAGS: None   WEIGHT BEARING RESTRICTIONS:  No  FALLS:  Has patient fallen in last 6 months? No  LIVING ENVIRONMENT: 2 steps going into the house   OCCUPATION:  Works three days a week. 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  PATIENT SURVEYS:  FOTO 30 FOTO visit 6: 54% (Goal of 62%)  COGNITIVE STATUS: Within functional limits for tasks assessed   SENSATION: WFL  EDEMA:  Yes: significant vs Rt   GAIT: Eval: amb with RW, antalgic gait.    Body Part #1 Knee  LOWER EXTREMITY ROM:     Passive  Left eval 12/26 1/8 1/10 1/17 2/5  Knee flexion 90 95 108 115 127 127  Knee extension -4 -3 -3 -4 -3 0   (Blank rows = not tested)   Eval: minimal quad activation noted  TREATMENT DATE:  2/7 Manual: All PROM performed with distraction to reduce pain and improve movement PROM into extension with distraction     There-ex:           Flexion PROM  Nu-step 6 min reviewed progression of time Leg Press 3x15 75#    There-Act Full set of stairs up going up and down  Reviewed graded step down for the gym        2/5 Recumbent bike L 4 x34min PROM L knee  ROM 0-127 Long sit HSS Leg press (cybex) 75# 3x10 Leg extension machine 10# 3x10 HS curl machine 25# 3x10 Step ups 8 2x10 Stair climbing full flight x1 reciprocal Sit to stands x10  1/29 Manual: PROM into  flexion and extension   SLR 3 lbs 2x10  SAQ 3lbs 2x15   Step up 6 inch 2x12 Step Down 4 inch 2x12   Hamstring curl machine 25 lbs 2x15        PATIENT EDUCATION:  Education details: Teacher, Music of condition, POC, HEP, exercise form/rationale Person educated: Patient Education method: Explanation, Demonstration, Tactile cues, Verbal cues, and Handouts Education comprehension: verbalized understanding, returned demonstration, verbal cues required, tactile cues required, and needs further education  HOME EXERCISE PROGRAM:  [email or web address]  Access Code: [email or web address]   ASSESSMENT:  CLINICAL IMPRESSION: The patient was able to up and down the steps with a reciprocal pattern. He has some difficulty but he was advised he needs to practice. We reviewed how he can practice at the gym. We also got him on the leg press. He did well. He has een doing the knee extension machine on his own. He was advised that that can be irritating but it has not been so far. He has 2 more visits. We will likely D/C at that time.   Eval: Patient is a 78 y.o. M who was seen today for physical therapy evaluation and treatment for s/p Lt TKA. Pt reports right knee is also popping frequently at this point. Has not been using ice as he does nto feel it penetrates the bandage. C/o severe pain when moving into a standing position and accepting weight.      REHAB POTENTIAL: Good  CLINICAL DECISION MAKING: Stable/uncomplicated  EVALUATION COMPLEXITY: Low   GOALS: Goals reviewed with patient? Yes  SHORT TERM GOALS: Target date: 1/10  ROM 0-90 without pain Baseline:see obj Goal status: 1/29 achieved L  2.  Able to demo heel strike with knee ext in ambulation Baseline: maintains flexion at eval Goal status: INITIAL    LONG TERM GOALS: Target date: POC date  Ambulate household and community distances pain <=2/10 Baseline:  Goal status: MET 2/5 (walked through [employer] without issue)  2.  Navigate  stairs step over step Baseline:  Goal status: MET 2/5  3.  ROM 0-120 Baseline:  Goal status: MET  4.  Meet FOTO goal Baseline:  Goal status: IN PROGRESS  5.  Quad strength 85% of opposite LE Baseline:  Goal status: INITIAL  6.  Independent in long term HEP Baseline:  Goal status: INITIAL   PLAN:  PT FREQUENCY: 1-2x/week  PT DURATION: POC date  PLANNED INTERVENTIONS: 97164- PT Re-evaluation, 97110-Therapeutic exercises, 97530- Therapeutic activity, 97112- Neuromuscular re-education, 97535- Self Care, 02859- Manual therapy, [phone]- Gait training, [phone]- Aquatic Therapy, Patient/Family education, Balance training, Stair training, Taping, Dry Needling, Joint mobilization, Spinal mobilization, Scar mobilization, Cryotherapy, and Moist heat.  PLAN FOR NEXT SESSION: ROM, gross quad activation  Alm Don PT DPT  10/19/23 9:39 AM

## 2023-10-26 ENCOUNTER — Encounter (HOSPITAL_BASED_OUTPATIENT_CLINIC_OR_DEPARTMENT_OTHER): Payer: Self-pay | Admitting: Physical Therapy

## 2023-10-26 ENCOUNTER — Ambulatory Visit (HOSPITAL_BASED_OUTPATIENT_CLINIC_OR_DEPARTMENT_OTHER): Payer: Medicare Other | Admitting: Physical Therapy

## 2023-10-26 DIAGNOSIS — M25562 Pain in left knee: Secondary | ICD-10-CM | POA: Diagnosis not present

## 2023-10-26 DIAGNOSIS — M25662 Stiffness of left knee, not elsewhere classified: Secondary | ICD-10-CM

## 2023-10-26 DIAGNOSIS — R2689 Other abnormalities of gait and mobility: Secondary | ICD-10-CM

## 2023-10-26 DIAGNOSIS — G8929 Other chronic pain: Secondary | ICD-10-CM

## 2023-10-26 NOTE — Therapy (Addendum)
OUTPATIENT PHYSICAL THERAPY TREATMENT/Discharge    Patient Name: Troy [name] MRN: [id number] DOB:1946/06/07, 78 y.o., male Today's Date: 10/26/2023  END OF SESSION:  PT End of Session - 10/26/23 0852     Visit Number 13    Number of Visits 17    Date for PT Re-Evaluation 10/26/22    Authorization Type BCBS MCR    Progress Note Due on Visit 20    PT Start Time 0847    PT Stop Time 0925    PT Time Calculation (min) 38 min    Activity Tolerance Patient tolerated treatment well    Behavior During Therapy [facility] for tasks assessed/performed            Past Medical History:  Diagnosis Date   Abdominal distention    hernia   Anemia    Arthritis    big toes    Cancer (HCC)    prostate    Chronic kidney disease    Hypertension    Lumbar herniated disc    L5   Pneumonia    Rash    yeast infection from bladder sling surgery    Sciatica of right side 09/01/2011   right leg-tx. Tylenol   Sleep apnea    cpap   Past Surgical History:  Procedure Laterality Date   HERNIA REPAIR  September 01, 2010   INCONTINENCE SURGERY  Jan 31, 2011   INSERTION OF MESH  03/10/2015   Procedure: INSERTION OF MESH;  Surgeon: Luretha Murphy, MD;  [facility];  Service: General;;   IR RADIOLOGIST EVAL & MGMT  05/15/2018   IR RADIOLOGIST EVAL & MGMT  07/13/2022   LAPAROSCOPIC NISSEN FUNDOPLICATION N/A 03/10/2015   Procedure: LAPAROSCOPIC NISSEN FUNDOPLICATION AND HIATAL HERNIA REPAIR;  Surgeon: Luretha Murphy, MD;  [facility];  Service: General;  Laterality: N/A;   LAPAROTOMY N/A 05/29/2022   Procedure: laparoscopic assisted small bowel resection with anastamosis;  Surgeon: Sheliah Hatch, De Blanch, MD;  [facility];  Service: General;  Laterality: N/A;   PROSTATECTOMY  reb 9, 2010   RADIOLOGY WITH ANESTHESIA N/A 08/30/2022   Procedure: RENAL CRYOABLATION;  Surgeon: Simonne Come, MD;  [facility];  Service: Anesthesiology;  Laterality: N/A;   TONSILLECTOMY     TRANSURETHRAL  RESECTION OF BLADDER TUMOR N/A 06/15/2022   Procedure: TRANSURETHRAL RESECTION OF BLADDER TUMOR (TURBT)/ CYSTOSCOPY/ BILATERAL RETROGRADE/;  Surgeon: Heloise Purpura, MD;  [facility];  Service: Urology;  Laterality: N/A;  GENERAL ANESTHESIA WITH PARALYSIS   UPPER GI ENDOSCOPY N/A 03/10/2015   Procedure: UPPER GI ENDOSCOPY;  Surgeon: Luretha Murphy, MD;  [facility];  Service: General;  Laterality: N/A;   VENTRAL HERNIA REPAIR  09/06/2011   Procedure: HERNIA REPAIR VENTRAL ADULT;  Surgeon: Valarie Merino, MD;  [facility];  Service: General;  Laterality: N/A;   VENTRAL HERNIA REPAIR  09/06/2011   Procedure: LAPAROSCOPIC VENTRAL HERNIA;  Surgeon: Valarie Merino, MD;  [facility];  Service: General;  Laterality: N/A;   Patient Active Problem List   Diagnosis Date Noted   Renal cell carcinoma of right kidney (HCC) 08/30/2022   SBO (small bowel obstruction) (HCC) 05/24/2022   Bladder mass 05/24/2022   Renal mass 05/24/2022   Anxiety 05/24/2022   HTN (hypertension) 05/24/2022   HLD (hyperlipidemia) 05/24/2022   Primary osteoarthritis of left shoulder 05/05/2020   Diverticulitis 05/03/2018   Status post laparoscopic Nissen fundoplication and repair of large hiatus hernia June 2016 03/10/2015   Ventral hernia-s/p  repair x 2 09/07/2011   History of prostate cancer-s/p prostatectomy 09/07/2011        REFERRING PROVIDER:  Ollen Gross, MD    REFERRING DIAG: [phone] (ICD-10-CM) - Presence of left artificial knee joint   S/p Lt TKA  Rationale for Evaluation and Treatment: Rehabilitation  THERAPY DIAG:  Chronic pain of left knee  Other abnormalities of gait and mobility  Stiffness of left knee, not elsewhere classified  ONSET DATE: DOS 12/17   SUBJECTIVE:                                                                                                                                                                                           SUBJECTIVE  STATEMENT: Pt reports that he returned to work 2 wks ago. He reports he is exhausted after returning home from work.  He complains of swelling and stiffness in L knee, and continued difficulty with stairs.     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 no: NPRS scale: 0/10 Pain location: Lt knee Pain description:  Aggravating factors: standing Relieving factors: meds  PRECAUTIONS:  None  RED FLAGS: None   WEIGHT BEARING RESTRICTIONS:  No  FALLS:  Has patient fallen in last 6 months? No  LIVING ENVIRONMENT: 2 steps going into the house   OCCUPATION:  Works three days a week. 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  PATIENT SURVEYS:  FOTO 30 FOTO visit 6: 54% (Goal of 62%)  COGNITIVE STATUS: Within functional limits for tasks assessed   SENSATION: WFL  EDEMA:  Yes: significant vs Rt   GAIT: Eval: amb with RW, antalgic gait.    Body Part #1 Knee  LOWER EXTREMITY ROM:     Passive  Left eval 12/26 1/8 1/10 1/17 2/5  Knee flexion 90 95 108 115 127 127  Knee extension -4 -3 -3 -4 -3 0   (Blank rows = not tested)   Eval: minimal quad activation noted   LOWER EXTREMITY MMT:  MMT Right 10/26/23 Left 10/26/23  Hip flexion    Hip extension    Hip abduction 27.9 31.2  Hip adduction    Hip internal rotation    Hip external rotation    Knee flexion 28.0 24.1  Knee extension 36.1 37.5  Ankle dorsiflexion    Ankle plantarflexion    Ankle inversion    Ankle  eversion     (Blank rows = not tested)                                                                                              TREATMENT DATE:  [facility] Adult PT Treatment:                                                DATE:  10/26/23  Therapeutic Exercise: NuStep L 4, UE/LE x 6.77min MMT (see above)  Leg press (life fitness) 60# 2 x15 Leg extension machine 10# 3x10 cues to slow speed HS curl machine 25# x10; 35# 2 x 10 Hip abduct 55#, 2 x 10  Therapeutic Activity: Stair climbing full flight x1 reciprocal pattern  Self Care: Discussed edema control with use of compression sleeve and sock, elevation and ice.    2/7 Manual: All PROM performed with distraction to reduce pain and improve movement PROM into extension with distraction     There-ex:           Flexion PROM  Nu-step 6 min reviewed progression of time Leg Press 3x15 75#    There-Act Full set of stairs up going up and down  Reviewed graded step down for the gym        2/5 Recumbent bike L 4 x55min PROM L knee  ROM 0-127 Long sit HSS Leg press (cybex) 75# 3x10 Leg extension machine 10# 3x10 HS curl machine 25# 3x10 Step ups 8" 2x10 Stair climbing full flight x1 reciprocal Sit to stands x10  1/29 Manual: PROM into flexion and extension   SLR 3 lbs 2x10  SAQ 3lbs 2x15   Step up 6 inch 2x12 Step Down 4 inch 2x12   Hamstring curl machine 25 lbs 2x15        PATIENT EDUCATION:  Education details: Teacher, music of condition, POC, HEP, exercise form/rationale Person educated: Patient Education method: Explanation, Demonstration, Tactile cues, Verbal cues, and Handouts Education comprehension: verbalized understanding, returned demonstration, verbal cues required, tactile cues required, and needs further education  HOME EXERCISE PROGRAM:  [email or web address]  Access Code: [email or web address]   ASSESSMENT:  CLINICAL IMPRESSION: Pt tolerated all exercises well, without increase in pain.  He is able to demonstrate smooth motion with good control both ascending and descending flight of stairs with single rail.  He was advised to use compression to L knee / lower leg to address continued swelling; verbalized understanding.  He has  met all goals except FOTO.  Pt verbalized readiness to d/c to HEP; pleased with current level of function.      REHAB POTENTIAL: Good  CLINICAL DECISION MAKING: Stable/uncomplicated  EVALUATION COMPLEXITY: Low   GOALS: Goals reviewed with patient? Yes  SHORT TERM GOALS: Target date: 1/10  ROM 0-90 without pain Baseline:see obj Goal status: 1/29 achieved L  2.  Able to demo heel strike with knee ext in ambulation Baseline: maintains flexion at eval Goal status: MET - 10/26/23    LONG TERM GOALS: Target  date: POC date  Ambulate household and community distances pain <=2/10 Baseline:  Goal status: MET 2/5 (walked through [employer] without issue)  2.  Navigate stairs step over step Baseline:  Goal status: MET 2/5  3.  ROM 0-120 Baseline:  Goal status: MET  4.  Meet FOTO goal Baseline: 61; goal of 62 Goal status: NOT MET - 10/26/23    5.  Quad strength 85% of opposite LE Baseline:  Goal status: MET - 10/26/23  6.  Independent in long term HEP Baseline:  Goal status: MET - 10/26/23   PLAN:  PT FREQUENCY: 1-2x/week  PT DURATION: POC date  PLANNED INTERVENTIONS: 97164- PT Re-evaluation, 97110-Therapeutic exercises, 97530- Therapeutic activity, 97112- Neuromuscular re-education, 97535- Self Care, 16109- Manual therapy, [phone]- Gait training, [phone]- Aquatic Therapy, Patient/Family education, Balance training, Stair training, Taping, Dry Needling, Joint mobilization, Spinal mobilization, Scar mobilization, Cryotherapy, and Moist heat.  Therapist has reviewed and agreed with plan   Lorayne Bender PT DPT    Mayer Camel, PTA 10/26/23 10:41 AM [facility] [address] [place], [place], [address] Phone: [phone]   Fax:  [phone]

## 2023-10-31 ENCOUNTER — Encounter (HOSPITAL_BASED_OUTPATIENT_CLINIC_OR_DEPARTMENT_OTHER): Payer: Medicare Other | Admitting: Physical Therapy

## 2023-11-15 ENCOUNTER — Other Ambulatory Visit (HOSPITAL_BASED_OUTPATIENT_CLINIC_OR_DEPARTMENT_OTHER): Payer: Self-pay | Admitting: Family Medicine

## 2023-11-15 DIAGNOSIS — E782 Mixed hyperlipidemia: Secondary | ICD-10-CM

## 2023-12-12 ENCOUNTER — Ambulatory Visit (HOSPITAL_COMMUNITY)
Admission: RE | Admit: 2023-12-12 | Discharge: 2023-12-12 | Disposition: A | Discharge status: Home / Self Care | Payer: Self-pay | Source: Ambulatory Visit | Attending: Family Medicine | Admitting: Family Medicine

## 2023-12-12 DIAGNOSIS — E782 Mixed hyperlipidemia: Secondary | ICD-10-CM | POA: Insufficient documentation

## 2024-03-26 ENCOUNTER — Other Ambulatory Visit: Payer: Self-pay

## 2024-03-26 DIAGNOSIS — C641 Malignant neoplasm of right kidney, except renal pelvis: Secondary | ICD-10-CM

## 2024-04-27 ENCOUNTER — Other Ambulatory Visit

## 2024-05-10 ENCOUNTER — Ambulatory Visit
Admission: RE | Admit: 2024-05-10 | Discharge: 2024-05-10 | Disposition: A | Discharge status: Home / Self Care | Source: Ambulatory Visit

## 2024-05-10 DIAGNOSIS — C641 Malignant neoplasm of right kidney, except renal pelvis: Secondary | ICD-10-CM

## 2024-05-10 MED ORDER — GADOPICLENOL 0.5 MMOL/ML IV SOLN
10.0000 mL | Freq: Once | INTRAVENOUS | Status: AC | PRN
  Administered 2024-05-10: 10 mL via INTRAVENOUS

## 2024-05-16 ENCOUNTER — Other Ambulatory Visit: Payer: Self-pay

## 2024-05-16 DIAGNOSIS — C641 Malignant neoplasm of right kidney, except renal pelvis: Secondary | ICD-10-CM

## 2024-06-04 ENCOUNTER — Ambulatory Visit
Admission: RE | Admit: 2024-06-04 | Discharge: 2024-06-04 | Disposition: A | Discharge status: Home / Self Care | Source: Ambulatory Visit

## 2024-06-04 DIAGNOSIS — C641 Malignant neoplasm of right kidney, except renal pelvis: Secondary | ICD-10-CM

## 2024-06-04 HISTORY — PX: IR RADIOLOGIST EVAL & MGMT: IMG5224

## 2024-06-04 NOTE — Progress Notes (Signed)
 Chief Complaint: Patient was seen in consultation today for routine follow up of 08/30/24 R renal cryoablation by Dr. Adele   Referring Physician(s): Dr. Renda  History of Present Illness: Troy [name] is a 78 y.o. male with past medical history significant for hypertension, anemia, sleep apnea and prostate cancer who was found to have an approximately 1.7 x 1.2 cm partially exophytic enhancing lesion arising from the posterior aspect of the inferior pole of the right kidney on abdominal CT performed 05/24/2022.  Additionally, patient was found to have an approximate 1.8 cm enhancing lesion within the left side of the urinary bladder (which was been subsequently removed by Dr. Renda). After consultation with Dr. Adele with IR, decision made to proceed with CT guided cryoablation of right sided RCC; completed 08/30/24. His last routine follow up for this procedure was 05/09/24 at which time review of abdominal MRI performed 12/22/2022 demonstrated a technically excellent result without definitive evidence of residual nodular enhancing tissue to suggest residual disease. He returns today to discuss surveillance imaging obtained 05/10/24.  Past Medical History:  Diagnosis Date   Abdominal distention    hernia   Anemia    Arthritis    big toes    Cancer (HCC)    prostate    Chronic kidney disease    Hypertension    Lumbar herniated disc    L5   Pneumonia    Rash    yeast infection from bladder sling surgery    Sciatica of right side 09/01/2011   right leg-tx. Tylenol    Sleep apnea    cpap    Past Surgical History:  Procedure Laterality Date   HERNIA REPAIR  September 01, 2010   INCONTINENCE SURGERY  Jan 31, 2011   INSERTION OF MESH  03/10/2015   Procedure: INSERTION OF MESH;  Surgeon: Donnice Lunger, MD;  Location: [facility];  Service: General;;   IR RADIOLOGIST EVAL & MGMT  05/15/2018   IR RADIOLOGIST EVAL & MGMT  07/13/2022   LAPAROSCOPIC NISSEN FUNDOPLICATION N/A 03/10/2015    Procedure: LAPAROSCOPIC NISSEN FUNDOPLICATION AND HIATAL HERNIA REPAIR;  Surgeon: Donnice Lunger, MD;  Location: [facility];  Service: General;  Laterality: N/A;   LAPAROTOMY N/A 05/29/2022   Procedure: laparoscopic assisted small bowel resection with anastamosis;  Surgeon: Stevie, Herlene Righter, MD;  Location: [facility];  Service: General;  Laterality: N/A;   PROSTATECTOMY  reb 9, 2010   RADIOLOGY WITH ANESTHESIA N/A 08/30/2022   Procedure: RENAL CRYOABLATION;  Surgeon: Adele Rush, MD;  Location: [facility];  Service: Anesthesiology;  Laterality: N/A;   TONSILLECTOMY     TRANSURETHRAL RESECTION OF BLADDER TUMOR N/A 06/15/2022   Procedure: TRANSURETHRAL RESECTION OF BLADDER TUMOR (TURBT)/ CYSTOSCOPY/ BILATERAL RETROGRADE/;  Surgeon: Renda Glance, MD;  Location: [facility];  Service: Urology;  Laterality: N/A;  GENERAL ANESTHESIA WITH PARALYSIS   UPPER GI ENDOSCOPY N/A 03/10/2015   Procedure: UPPER GI ENDOSCOPY;  Surgeon: Donnice Lunger, MD;  Location: [facility];  Service: General;  Laterality: N/A;   VENTRAL HERNIA REPAIR  09/06/2011   Procedure: HERNIA REPAIR VENTRAL ADULT;  Surgeon: Donnice KATHEE Lunger, MD;  Location: [facility];  Service: General;  Laterality: N/A;   VENTRAL HERNIA REPAIR  09/06/2011   Procedure: LAPAROSCOPIC VENTRAL HERNIA;  Surgeon: Donnice KATHEE Lunger, MD;  Location: [facility];  Service: General;  Laterality: N/A;    Allergies: Ciprofloxacin and Wound dressings [skin protectants, misc.]  Medications: Prior to Admission medications   Medication Sig Start Date End Date  Taking? Authorizing Provider  ALPRAZolam  (XANAX ) 0.25 MG tablet Take 0.25 mg by mouth at bedtime as needed for anxiety or sleep.    [provider]  Cholecalciferol (VITAMIN D3) 2000 UNITS TABS Take 2,000 Units by mouth every morning.    [provider]  ezetimibe  (ZETIA ) 10 MG tablet Take 10 mg by mouth at bedtime.    [provider]  hydrochlorothiazide  (HYDRODIURIL ) 25 MG tablet Take 25 mg by mouth daily.  05/19/22   [provider]  HYDROcodone -acetaminophen  (NORCO) 5-325 MG tablet Take 1 tablet by mouth every 6 (six) hours as needed for moderate pain. Patient not taking: Reported on 05/16/2023 08/30/22   Han, Aimee H, PA-C  Liniments (BLUE-EMU SUPER STRENGTH EX) Apply 1 Application topically daily as needed (pain).    [provider]  loratadine  (CLARITIN ) 10 MG tablet Take 10 mg by mouth daily as needed for allergies. Started on 06/06/22 Patient not taking: Reported on 05/16/2023    [provider]  losartan  (COZAAR ) 100 MG tablet Take 100 mg by mouth every morning.    [provider]  metoprolol  succinate (TOPROL -XL) 50 MG 24 hr tablet Take 50 mg by mouth daily. Take with or immediately following a meal.    [provider]  Multiple Vitamins-Minerals (LUTEIN-ZEAXANTHIN PO) Take 1 tablet by mouth daily.    [provider]  NON FORMULARY Pt uses a cpap nightly    [provider]  OVER THE COUNTER MEDICATION Take 1 capsule by mouth at bedtime as needed (sleep). Somnapure otc sleep aid    [provider]  phenazopyridine  (PYRIDIUM ) 200 MG tablet Take 1 tablet (200 mg total) by mouth 3 (three) times daily as needed for pain. Patient not taking: Reported on 08/21/2022 06/15/22   Renda Glance, MD  predniSONE  (DELTASONE ) 10 MG tablet 6 tablets first day decrease by 1 tablet each day until finished. Patient not taking: Reported on 05/16/2023 09/22/22   Towana Ozell BROCKS, MD  rosuvastatin  (CRESTOR ) 20 MG tablet Take 20 mg by mouth at bedtime.    [provider]  Tetrahydrozoline HCl (VISINE OP) Place 1 drop into both eyes daily as needed (redness).    [provider]  Turmeric (QC TUMERIC COMPLEX PO) Take 1,500 mg by mouth daily.    [provider]     Family History  Problem Relation Age of Onset   Lung cancer Mother    Coronary artery disease Mother    Stroke Father    Diabetes Father    Bladder Cancer  Father    Dementia Father    Cervical cancer Sister     Social History   Socioeconomic History   Marital status: Married    Spouse name: Not on file   Number of children: Not on file   Years of education: Not on file   Highest education level: Not on file  Occupational History   Not on file  Tobacco Use   Smoking status: Never   Smokeless tobacco: Never  Vaping Use   Vaping status: Never Used  Substance and Sexual Activity   Alcohol use: Yes    Comment: occ. alcohol   Drug use: No   Sexual activity: Yes  Other Topics Concern   Not on file  Social History Narrative   Not on file   Social Drivers of Health   Financial Resource Strain: Not on file  Food Insecurity: No Food Insecurity (05/24/2022)   Hunger Vital Sign    Worried About  Running Out of Food in the Last Year: Never true    Ran Out of Food in the Last Year: Never true  Transportation Needs: No Transportation Needs (05/24/2022)   PRAPARE - Administrator, Civil Service (Medical): No    Lack of Transportation (Non-Medical): No  Physical Activity: Not on file  Stress: Not on file  Social Connections: Not on file      Review of Systems: A 12 point ROS discussed and pertinent positives are indicated in the HPI above.  All other systems are negative.  Review of Systems  Constitutional:  Negative for chills and fever.  Respiratory:  Negative for shortness of breath.   Gastrointestinal:  Negative for abdominal distention, abdominal pain, blood in stool, nausea and vomiting.  Genitourinary:  Negative for flank pain and hematuria.    Vital Signs: BP (!) 153/79 (BP Location: Left Arm, Patient Position: Sitting, Cuff Size: Normal)   Pulse 63   Temp 97.6 F (36.4 C) (Oral)   Resp 18   SpO2 94%      Physical Exam Constitutional:      Appearance: Normal appearance.  Cardiovascular:     Rate and Rhythm: Normal rate.  Pulmonary:     Effort: Pulmonary effort is normal.  Skin:    General: Skin  is warm and dry.  Neurological:     Mental Status: He is alert and oriented to person, place, and time.  Psychiatric:        Mood and Affect: Mood normal.        Behavior: Behavior normal.        Thought Content: Thought content normal.     Mallampati Score:     Imaging: MR ABDOMEN WWO CONTRAST Result Date: 05/11/2024 CLINICAL DATA:  Right renal cell carcinoma, status post cryoablation EXAM: MRI ABDOMEN WITHOUT AND WITH CONTRAST TECHNIQUE: Multiplanar multisequence MR imaging of the abdomen was performed both before and after the administration of intravenous contrast. CONTRAST:  10 mL view a gadolinium contrast IV COMPARISON:  04/27/2023 FINDINGS: Lower chest: No acute abnormality. Hepatobiliary: No solid liver abnormality is seen. Severe hepatic steatosis. Unchanged transient perfusional hyperenhancement in the peripheral right lobe of the liver, again related to shunting and benign (series 12, image 63). Small gallstones. No gallbladder wall thickening, or biliary dilatation. Pancreas: Unremarkable. No pancreatic ductal dilatation or surrounding inflammatory changes. Spleen: Normal in size without significant abnormality. Adrenals/Urinary Tract: Adrenal glands are unremarkable. Interval increase in organization of fat necrosis associated with an ablation site of the posterior inferior pole of the right kidney (series 10, image 30). No associated suspicious soft tissue or contrast enhancement. Additional hemorrhagic or proteinaceous cysts of the superior pole of the left kidney, benign, requiring no further follow-up or characterization. Stomach/Bowel: Stomach is within normal limits. No evidence of bowel wall thickening, distention, or inflammatory changes. Pancolonic diverticulosis. Vascular/Lymphatic: No significant vascular findings are present. No enlarged abdominal lymph nodes. Other: No abdominal wall hernia or abnormality. No ascites. Musculoskeletal: No acute or significant osseous  findings. IMPRESSION: 1. Interval increase in organization of fat necrosis associated with an ablation site of the posterior inferior pole of the right kidney. No associated suspicious soft tissue or contrast enhancement to suggest local recurrence. 2. No evidence of lymphadenopathy or metastatic disease in the abdomen. 3. Severe hepatic steatosis. 4. Cholelithiasis. 5. Pancolonic diverticulosis. Electronically Signed   By: Marolyn JONETTA Jaksch M.D.   On: 05/11/2024 17:40    Labs:  CBC: No results for input(s): WBC, HGB,  HCT, PLT in the last 8760 hours.  COAGS: No results for input(s): INR, APTT in the last 8760 hours.  BMP: No results for input(s): NA, K, CL, CO2, GLUCOSE, BUN, CALCIUM , CREATININE, GFRNONAA, GFRAA in the last 8760 hours.  Invalid input(s): CMP  LIVER FUNCTION TESTS: No results for input(s): BILITOT, AST, ALT, ALKPHOS, PROT, ALBUMIN in the last 8760 hours.  TUMOR MARKERS: No results for input(s): AFPTM, CEA, CA199, CHROMGRNA in the last 8760 hours.  Assessment and Plan:  Mr. Renfroe presents for routine follow up s/p renal cryoablation 08/30/22. Interval increase in organization of fat necrosis associated with an ablation site of the posterior inferior pole of the right kidney seen on 05/11/24 MR imaging. No concerns for recurrence or new lesion at this time. Patient to return in 1 yr for surveillance imaging (MR abd w/wo contrast in August 2026 with subsequent clinic follow up, may be televisit).   Thank you for this interesting consult.  I greatly enjoyed meeting ZACKARIE CHASON and look forward to participating in their care.  A copy of this report was sent to the requesting provider on this date.  Electronically Signed: [place] Edd Reppert 06/04/2024, 11:38 AM   I spent a total of   15 Minutes in face to face in clinical consultation, greater than 50% of which was counseling/coordinating care for s/p 08/30/22  cryoablation.

## 2024-07-17 ENCOUNTER — Ambulatory Visit: Admitting: Podiatry

## 2024-07-17 ENCOUNTER — Encounter: Payer: Self-pay | Admitting: Podiatry

## 2024-07-17 DIAGNOSIS — M2041 Other hammer toe(s) (acquired), right foot: Secondary | ICD-10-CM

## 2024-07-17 DIAGNOSIS — M2042 Other hammer toe(s) (acquired), left foot: Secondary | ICD-10-CM

## 2024-07-17 DIAGNOSIS — M79674 Pain in right toe(s): Secondary | ICD-10-CM | POA: Diagnosis not present

## 2024-07-17 DIAGNOSIS — B351 Tinea unguium: Secondary | ICD-10-CM

## 2024-07-17 MED ORDER — CICLOPIROX 8 % EX SOLN: Freq: Every day | CUTANEOUS | 12 refills | Status: AC

## 2024-07-17 NOTE — Addendum Note (Signed)
 Addended by: RINALDO DELON KIDD on: 07/17/2024 09:25 AM   Modules accepted: Orders

## 2024-07-17 NOTE — Patient Instructions (Signed)
 Look for urea 40-47% nail gel, cream or ointment and apply to the thickened toenails. This can be bought over the counter, at a pharmacy or online such as [employer].

## 2024-07-17 NOTE — Progress Notes (Signed)
  Subjective:  Patient ID: Troy [name], male    DOB: Sep 11, 1946,  MRN: [id number]  Chief Complaint  Patient presents with   Nail Problem    RFC  Pt thinks he may have fungus on R 2nd toe.  Not diabetic. No anti coag    Discussed the use of AI scribe software for clinical note transcription with the patient, who gave verbal consent to proceed.  History of Present Illness Troy [name] is a 78 year old male who presents with concerns about changes in his right second toenail.  He has experienced thickening, discoloration, and debris in the right second toenail for several years. He is concerned about these changes. He is currently taking a statin and prefers to avoid systemic treatments due to concerns about potential medication interactions and liver effects. He is considering topical treatments as a less invasive option.      Objective:    Physical Exam EXTREMITIES: Second toenail on the right side is thickened, discolored, with debris. Mild hammer toe contractures present. Callus present at the end of the second toenail on the right side. No cyanosis or edema. DP and PT pulses palpable 2/4 bilaterally.  Cap refill intact to the digits. Light touch and protective sensation intact.   No images are attached to the encounter.    Results    Assessment:   1. Pain due to onychomycosis of toenail of right foot   2. Acquired hammertoes of both feet      Plan:  Patient was evaluated and treated and all questions answered.  Assessment and Plan Assessment & Plan Onychomycosis of right second toenail Chronic onychomycosis with thickening, discoloration, and debris. Differential includes mechanical trauma from hammer toe. Topical treatment preferred over oral antifungals due to lower systemic risk. - Trimmed and thinned toenail to enhance medication penetration. - Prescribed cyclopirox (Penlac) nightly. - Advised weekly removal of medication with rubbing alcohol or nail  polish remover. - Recommended over-the-counter urea-based nail gel (40-47%) before Penlac. - Sent toenail sample for fungal confirmation. Will notify patient of pertinent results. -Potentially interested in removal of the toenail if there is limited to no response with the topical medication.  Did discuss prolonged course of treatment. - Scheduled follow-up in three months.  Hammer toe contracture of right second toe Mild contracture contributing to mechanical trauma and potential nail deformity. - Monitor condition and assess impact on nail health and callus formation.  Callus of right second toe Callus formation due to mechanical pressure from hammer toe and nail deformity.      Return in about 3 months (around 10/17/2024) for Onychomycosis.

## 2024-08-11 ENCOUNTER — Ambulatory Visit: Payer: Self-pay | Admitting: Podiatry

## 2024-08-11 DIAGNOSIS — B351 Tinea unguium: Secondary | ICD-10-CM

## 2024-08-20 ENCOUNTER — Ambulatory Visit

## 2024-08-20 VITALS — BP 122/82 | HR 58 | Temp 97.2°F | Ht 69.0 in | Wt 244.6 lb

## 2024-08-20 DIAGNOSIS — G4733 Obstructive sleep apnea (adult) (pediatric): Secondary | ICD-10-CM | POA: Diagnosis not present

## 2024-08-20 NOTE — Addendum Note (Signed)
 Addended by: Keneshia Tena M on: 08/20/2024 09:49 AM   Modules accepted: Orders

## 2024-08-20 NOTE — Patient Instructions (Signed)
 Your sleep apnea is well controlled.  we will order new mask for you.
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
## 2024-08-20 NOTE — Addendum Note (Signed)
 Addended by: Leaf Kernodle M on: 08/20/2024 09:48 AM   Modules accepted: Orders

## 2024-08-27 ENCOUNTER — Telehealth: Payer: Self-pay

## 2024-08-27 NOTE — Telephone Encounter (Signed)
 Copied from CRM #[id number]. Topic: Clinical - Order For Equipment >> Aug 27, 2024  9:33 AM Rilla B wrote: Reason for CRM: Patient states he needs new CPAP Mask Airtouch F30I (medium) mask.  States [facility] needs signed clinical doctor's notes and a signed order. If any questions, please call [phone].  Okay to leave VM.   Faxed signed mask order and LOV notes to [facility] at [phone]. Received fax confirmation.  Called and spoke to pt's wife, Agnes Probert [facility]) and informed her that everything has been faxed and the successful fax confirmation was received. NFN.

## 2024-10-17 ENCOUNTER — Ambulatory Visit: Admitting: Podiatry

## 2024-10-17 ENCOUNTER — Encounter: Payer: Self-pay | Admitting: Podiatry

## 2024-10-17 DIAGNOSIS — L603 Nail dystrophy: Secondary | ICD-10-CM

## 2024-10-17 DIAGNOSIS — M2041 Other hammer toe(s) (acquired), right foot: Secondary | ICD-10-CM

## 2024-10-17 NOTE — Patient Instructions (Addendum)
 You can combine 3 tablespoons of coconut oil and 10-15 drops of tea tree oil together and apply to the toenails once a day.   Look for urea 40-47% nail gel, cream or ointment and apply to the thickened toenails. This can be bought over the counter, at a pharmacy or online such as [employer].   More silicone pads can be purchased from:  [email or web address]  Look for crest pads for hammertoes.  You can also find these on [employer].

## 2024-10-17 NOTE — Progress Notes (Signed)
 "      Chief Complaint  Patient presents with   Nail Problem    Onychomycosis right 2nd toe.      HPI: 79 y.o. Troy [name] presents today to review nail pathology for right second toe.  Have communication with patient between having the nail pathology results reviewed and his current visit in which he was instructed to use urea-based nail gel to help soften the nail and he can decide whether or not he wished to continue the ciclopirox .  He states that he has been using a salicylic acid wart remover on the toenails.  He has not had any issue with this.  He denies significant or limiting pain to the toenail.  The toenail is associated with hammertoe contracture on the right second toe.  Past Medical History:  Diagnosis Date   Abdominal distention    hernia   Anemia    Arthritis    big toes    Cancer (HCC)    prostate    Chronic kidney disease    Hypertension    Lumbar herniated disc    L5   Pneumonia    Rash    yeast infection from bladder sling surgery    Sciatica of right side 09/01/2011   right leg-tx. Tylenol    Sleep apnea    cpap    Past Surgical History:  Procedure Laterality Date   HERNIA REPAIR  September 01, 2010   INCONTINENCE SURGERY  Jan 31, 2011   INSERTION OF MESH  03/10/2015   Procedure: INSERTION OF MESH;  Surgeon: Donnice Lunger, MD;  Location: [facility];  Service: General;;   IR RADIOLOGIST EVAL & MGMT  05/15/2018   IR RADIOLOGIST EVAL & MGMT  07/13/2022   IR RADIOLOGIST EVAL & MGMT  06/04/2024   LAPAROSCOPIC NISSEN FUNDOPLICATION N/A 03/10/2015   Procedure: LAPAROSCOPIC NISSEN FUNDOPLICATION AND HIATAL HERNIA REPAIR;  Surgeon: Donnice Lunger, MD;  Location: [facility];  Service: General;  Laterality: N/A;   LAPAROTOMY N/A 05/29/2022   Procedure: laparoscopic assisted small bowel resection with anastamosis;  Surgeon: Stevie, Herlene Righter, MD;  Location: [facility];  Service: General;  Laterality: N/A;   PROSTATECTOMY  reb 9, 2010   RADIOLOGY WITH ANESTHESIA N/A 08/30/2022    Procedure: RENAL CRYOABLATION;  Surgeon: Adele Rush, MD;  Location: [facility];  Service: Anesthesiology;  Laterality: N/A;   TONSILLECTOMY     TRANSURETHRAL RESECTION OF BLADDER TUMOR N/A 06/15/2022   Procedure: TRANSURETHRAL RESECTION OF BLADDER TUMOR (TURBT)/ CYSTOSCOPY/ BILATERAL RETROGRADE/;  Surgeon: Renda Glance, MD;  Location: [facility];  Service: Urology;  Laterality: N/A;  GENERAL ANESTHESIA WITH PARALYSIS   UPPER GI ENDOSCOPY N/A 03/10/2015   Procedure: UPPER GI ENDOSCOPY;  Surgeon: Donnice Lunger, MD;  Location: [facility];  Service: General;  Laterality: N/A;   VENTRAL HERNIA REPAIR  09/06/2011   Procedure: HERNIA REPAIR VENTRAL ADULT;  Surgeon: Donnice KATHEE Lunger, MD;  Location: [facility];  Service: General;  Laterality: N/A;   VENTRAL HERNIA REPAIR  09/06/2011   Procedure: LAPAROSCOPIC VENTRAL HERNIA;  Surgeon: Donnice KATHEE Lunger, MD;  Location: [facility];  Service: General;  Laterality: N/A;    Allergies[1]  ROS    Physical Exam: There were no vitals filed for this visit.  General: The patient is alert and oriented x3 in no acute distress.  Dermatology: Second toenail on the right side is thickened, discolored, with debris. Brittle appearance, significantly dystrophic with mycotic features. Callus present at the end of the second toenail on the right  side. No cyanosis or edema.   Vascular: Palpable pedal pulses bilaterally. Capillary refill within normal limits.  No appreciable edema.  No erythema or calor.  Neurological: Light touch sensation grossly intact bilateral feet.   Musculoskeletal Exam: Semireducible hammertoe contractures of lesser toes of both feet.  Nail pathology: Nail bed hyperkeratinization with features of repetitive microtrauma.  PAS and GMS stains negative for fungal elements.  Assessment/Plan of Care: 1. Acquired hammertoes of both feet   2. Nail dystrophy      No orders of the defined types were placed in this encounter.  None  Discussed clinical findings  with patient today.  Nail pathology reviewed with patient.  Instructed patient to stop using salicylic acid on the nail plate.  We discussed hammertoe deformity as a mechanical means of additional pressure on the nail plate contributing to the dystrophic appearance.  Discussed various home care options you can use on the nail including 40 to 47% urea base nail gel on the nails under occlusion 1-2 times a day.  Alternatively he could also use tea tree oil mixed with coconut oil brush from the affected toenails at nighttime.  Crest pad was dispensed today to help straighten out the toe.  Discussed shoe choice and using shoes with high toebox to limit pressure to the toe.  We did discuss removal of the toenail as well.  Toenail is not causing him significant pain at this time.  He is deferring this for now.  Follow-up as needed.   Jenaveve Fenstermaker L. Lamount MAUL, AACFAS [facility]     [address] [place], [place] [address]                Office [phone]  Fax [phone]      [1]  Allergies Allergen Reactions   Troy Troy [name] Diarrhea and Nausea And Vomiting    Can take Troy Troy [name]   Wound Dressings [Skin Protectants, Misc.]     Dermatitis   "
